# Patient Record
Sex: Female | Born: 1985 | Race: White | Hispanic: No | Marital: Married | Smoking: Never smoker
Health system: Southern US, Community
[De-identification: ages and names within clinical notes are randomized; demographics above are authoritative.]

## PROBLEM LIST (undated history)

## (undated) DIAGNOSIS — R569 Unspecified convulsions: Secondary | ICD-10-CM

## (undated) DIAGNOSIS — G43909 Migraine, unspecified, not intractable, without status migrainosus: Secondary | ICD-10-CM

## (undated) DIAGNOSIS — F329 Major depressive disorder, single episode, unspecified: Secondary | ICD-10-CM

## (undated) DIAGNOSIS — Z87442 Personal history of urinary calculi: Secondary | ICD-10-CM

## (undated) DIAGNOSIS — K219 Gastro-esophageal reflux disease without esophagitis: Secondary | ICD-10-CM

## (undated) DIAGNOSIS — K5792 Diverticulitis of intestine, part unspecified, without perforation or abscess without bleeding: Secondary | ICD-10-CM

## (undated) DIAGNOSIS — J45909 Unspecified asthma, uncomplicated: Secondary | ICD-10-CM

## (undated) DIAGNOSIS — M549 Dorsalgia, unspecified: Secondary | ICD-10-CM

## (undated) DIAGNOSIS — T8859XA Other complications of anesthesia, initial encounter: Secondary | ICD-10-CM

## (undated) DIAGNOSIS — T4145XA Adverse effect of unspecified anesthetic, initial encounter: Secondary | ICD-10-CM

## (undated) DIAGNOSIS — F419 Anxiety disorder, unspecified: Secondary | ICD-10-CM

## (undated) DIAGNOSIS — R112 Nausea with vomiting, unspecified: Secondary | ICD-10-CM

## (undated) DIAGNOSIS — K5190 Ulcerative colitis, unspecified, without complications: Secondary | ICD-10-CM

## (undated) DIAGNOSIS — J302 Other seasonal allergic rhinitis: Secondary | ICD-10-CM

## (undated) DIAGNOSIS — G709 Myoneural disorder, unspecified: Secondary | ICD-10-CM

## (undated) DIAGNOSIS — K529 Noninfective gastroenteritis and colitis, unspecified: Secondary | ICD-10-CM

## (undated) DIAGNOSIS — M1990 Unspecified osteoarthritis, unspecified site: Secondary | ICD-10-CM

## (undated) DIAGNOSIS — D649 Anemia, unspecified: Secondary | ICD-10-CM

## (undated) DIAGNOSIS — Z9889 Other specified postprocedural states: Secondary | ICD-10-CM

## (undated) DIAGNOSIS — T148XXA Other injury of unspecified body region, initial encounter: Secondary | ICD-10-CM

## (undated) DIAGNOSIS — F32A Depression, unspecified: Secondary | ICD-10-CM

## (undated) DIAGNOSIS — N809 Endometriosis, unspecified: Secondary | ICD-10-CM

## (undated) HISTORY — PX: WISDOM TOOTH EXTRACTION: SHX21

## (undated) HISTORY — PX: UPPER GI ENDOSCOPY: SHX6162

## (undated) HISTORY — PX: OTHER SURGICAL HISTORY: SHX169

## (undated) HISTORY — PX: KNEE SURGERY: SHX244

## (undated) HISTORY — PX: BOWEL RESECTION: SHX1257

## (undated) HISTORY — PX: CHOLECYSTECTOMY: SHX55

## (undated) HISTORY — PX: COLONOSCOPY: SHX174

---
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
## 2007-11-02 ENCOUNTER — Ambulatory Visit (HOSPITAL_BASED_OUTPATIENT_CLINIC_OR_DEPARTMENT_OTHER): Admission: RE | Admit: 2007-11-02 | Discharge: 2007-11-02 | Payer: Self-pay | Admitting: Orthopedic Surgery

## 2008-09-26 ENCOUNTER — Other Ambulatory Visit: Admission: RE | Admit: 2008-09-26 | Discharge: 2008-09-26 | Payer: Self-pay | Admitting: Obstetrics & Gynecology

## 2010-01-16 ENCOUNTER — Other Ambulatory Visit: Admission: RE | Admit: 2010-01-16 | Discharge: 2010-01-16 | Payer: Self-pay | Admitting: Obstetrics & Gynecology

## 2010-02-03 ENCOUNTER — Encounter
Admission: RE | Admit: 2010-02-03 | Discharge: 2010-05-04 | Payer: Self-pay | Source: Home / Self Care | Admitting: Orthopedic Surgery

## 2010-05-29 ENCOUNTER — Encounter
Admission: RE | Admit: 2010-05-29 | Discharge: 2010-06-10 | Payer: Self-pay | Source: Home / Self Care | Admitting: Orthopedic Surgery

## 2010-06-20 ENCOUNTER — Emergency Department (HOSPITAL_COMMUNITY)
Admission: EM | Admit: 2010-06-20 | Discharge: 2010-06-21 | Payer: Self-pay | Source: Home / Self Care | Admitting: Emergency Medicine

## 2010-09-23 LAB — COMPREHENSIVE METABOLIC PANEL
ALT: 16 U/L (ref 0–35)
AST: 19 U/L (ref 0–37)
Albumin: 3.6 g/dL (ref 3.5–5.2)
Alkaline Phosphatase: 49 U/L (ref 39–117)
BUN: 11 mg/dL (ref 6–23)
CO2: 23 mEq/L (ref 19–32)
Calcium: 9.1 mg/dL (ref 8.4–10.5)
Chloride: 109 mEq/L (ref 96–112)
Creatinine, Ser: 1.04 mg/dL (ref 0.4–1.2)
GFR calc Af Amer: >60 mL/min (ref >60)
GFR calc non Af Amer: >60 mL/min (ref >60)
Glucose, Bld: 84 mg/dL (ref 70–99)
Potassium: 3.7 mEq/L (ref 3.5–5.1)
Sodium: 141 mEq/L (ref 135–145)
Total Bilirubin: 0.6 mg/dL (ref 0.3–1.2)
Total Protein: 7.2 g/dL (ref 6.0–8.3)

## 2010-09-23 LAB — URINALYSIS, ROUTINE W REFLEX MICROSCOPIC
Bilirubin Urine: NEGATIVE
Glucose, UA: NEGATIVE mg/dL
Ketones, ur: NEGATIVE mg/dL
Leukocytes, UA: NEGATIVE
Nitrite: NEGATIVE
Protein, ur: NEGATIVE mg/dL
Specific Gravity, Urine: 1.029 (ref 1.005–1.030)
Urobilinogen, UA: 0.2 mg/dL (ref 0.0–1.0)
pH: 6 (ref 5.0–8.0)

## 2010-09-23 LAB — DIFFERENTIAL
Basophils Absolute: 0.1 10*3/uL (ref 0.0–0.1)
Basophils Relative: 1 % (ref 0–1)
Eosinophils Absolute: 0.1 10*3/uL (ref 0.0–0.7)
Eosinophils Relative: 2 % (ref 0–5)
Lymphocytes Relative: 32 % (ref 12–46)
Lymphs Abs: 3 10*3/uL (ref 0.7–4.0)
Monocytes Absolute: 0.7 10*3/uL (ref 0.1–1.0)
Monocytes Relative: 8 % (ref 3–12)
Neutro Abs: 5.6 10*3/uL (ref 1.7–7.7)
Neutrophils Relative %: 59 % (ref 43–77)

## 2010-09-23 LAB — GC/CHLAMYDIA PROBE AMP, GENITAL
Chlamydia, DNA Probe: NEGATIVE
GC Probe Amp, Genital: NEGATIVE

## 2010-09-23 LAB — POCT PREGNANCY, URINE: Preg Test, Ur: NEGATIVE

## 2010-09-23 LAB — CBC
HCT: 39.8 % (ref 36.0–46.0)
Hemoglobin: 13.8 g/dL (ref 12.0–15.0)
MCH: 30.2 pg (ref 26.0–34.0)
MCHC: 34.7 g/dL (ref 30.0–36.0)
MCV: 87.1 fL (ref 78.0–100.0)
Platelets: 280 10*3/uL (ref 150–400)
RBC: 4.57 MIL/uL (ref 3.87–5.11)
RDW: 12.4 % (ref 11.5–15.5)
WBC: 9.5 10*3/uL (ref 4.0–10.5)

## 2010-09-23 LAB — URINE MICROSCOPIC-ADD ON

## 2010-09-23 LAB — RPR: RPR Ser Ql: NONREACTIVE

## 2010-09-23 LAB — WET PREP, GENITAL
Clue Cells Wet Prep HPF POC: NONE SEEN
Trich, Wet Prep: NONE SEEN
WBC, Wet Prep HPF POC: NONE SEEN
Yeast Wet Prep HPF POC: NONE SEEN

## 2010-11-25 NOTE — Op Note (Signed)
NAME:  [name] [name], [name] [name]               ACCOUNT NO.:  [id number]   MEDICAL RECORD NO.:  [id number]          PATIENT TYPE:  AMB   LOCATION:  DSC                          FACILITY:  [facility]   PHYSICIAN:  Harvie Junior, M.D.   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   POSTOPERATIVE DIAGNOSES:  1. 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  2. Medial shelf plica.   PROCEDURES:  1. Chondroplasty of the posterior patellar facet, particularly on the      lateral side.  2. Lateral retinacular release.  3. Excision of medial shelf plica.   SURGEON:  Harvie Junior, MD   ASSISTANT:  __________  .   ANESTHESIA:  General.   BRIEF HISTORY:  Ms. [name] [name] is a 25 year old female with a long history  of having had significant patellofemoral pain and possibly having had a  medial patellar dislocation that was unclear.  MRI showed that she had  some thickening on the medial side, questionable issues with the medial  patellar retinaculum, had some tightness on the lateral side, had some  chondromalacia of the patella.  We ultimately treated conservatively for  a period of time with physical therapy.  She really was not bending her  knee passed about 45 degrees.  This has been going on for about 3 months  and we felt that we are going to make sure there is not something that  was blocking her coming in to flexion as well as sorting out what the  issues were in the knee and she was brought to the operating room for  this procedure.  We planned preoperatively to do a lateral retinacular  release, which is based on her huge Q-angel and the patellar femoral  issues raised on MRI.   PROCEDURE:  The patient was brought to the  operating room and after  adequate anesthesia was obtained with general anesthetic, the patient  was placed supine on the operating table.  The left leg was prepped and  draped in usual sterile fashion.  Following this, routine arthroscopic  examination of the knee revealed that there was an obvious large medial  shelf plica.  This was debrided back to the rim of the knee joint  capsule.  Attention was turned medially where the medial meniscus and  medial femoral condyle were probed at length and felt to be stable.  The  attention was turned to the ACL, which was normal.  Attention was turned  to the lateral side where the lateral meniscus and lateral femoral  condyle were within normal limits.  Attention then turned away from the  lateral side up into the patellofemoral joint, where the patellofemoral  joint showed fairly dramatic lateral patellar tracking.  There was a  little bit of tightness in the patellar femoral joint as well.  At this  point, the lateral retinaculum  was identified and the lateral  retinacular release was performed from the 3 fingerbreadths proximal to  the patella all the way down to the joint line which was completed.  The  patellar tracking was identified and noted to be midline.  This was  dramatic correction of the patellar tracking once this was complete.  At  this point, the attention was turned towards the knee joint where a  chondromalacia on the lateral patellar facet was debrided back to a  smooth and stable rim.  At this point, the knee was copiously and  thoroughly irrigated, suctioned dry.  Final check was made for loosening  fragment and pieces, seeing none.  The patient was taken to the recovery  room and she was noted to be in satisfactory condition.  Estimated blood  loss for procedure was none.      Harvie Junior, M.D.  Electronically Signed     JLG/MEDQ  D:  11/02/2007  T:  11/03/2007  Job:  [id number]

## 2011-02-24 ENCOUNTER — Other Ambulatory Visit: Payer: Self-pay | Admitting: Obstetrics & Gynecology

## 2011-02-24 ENCOUNTER — Other Ambulatory Visit (HOSPITAL_COMMUNITY)
Admission: RE | Admit: 2011-02-24 | Discharge: 2011-02-24 | Disposition: A | Discharge status: Home / Self Care | Payer: 59 | Source: Ambulatory Visit | Attending: Obstetrics & Gynecology | Admitting: Obstetrics & Gynecology

## 2011-02-24 DIAGNOSIS — Z01419 Encounter for gynecological examination (general) (routine) without abnormal findings: Secondary | ICD-10-CM | POA: Insufficient documentation

## 2011-04-07 LAB — POCT HEMOGLOBIN-HEMACUE: Hemoglobin: 14.2

## 2011-12-14 ENCOUNTER — Other Ambulatory Visit: Payer: Self-pay | Admitting: Family Medicine

## 2011-12-14 DIAGNOSIS — R319 Hematuria, unspecified: Secondary | ICD-10-CM

## 2011-12-15 ENCOUNTER — Ambulatory Visit
Admission: RE | Admit: 2011-12-15 | Discharge: 2011-12-15 | Disposition: A | Discharge status: Home / Self Care | Payer: 59 | Source: Ambulatory Visit | Attending: Family Medicine | Admitting: Family Medicine

## 2011-12-15 ENCOUNTER — Other Ambulatory Visit: Payer: 59

## 2011-12-15 DIAGNOSIS — R319 Hematuria, unspecified: Secondary | ICD-10-CM

## 2011-12-20 ENCOUNTER — Emergency Department (HOSPITAL_COMMUNITY): Payer: 59

## 2011-12-20 ENCOUNTER — Encounter (HOSPITAL_COMMUNITY): Payer: Self-pay | Admitting: *Deleted

## 2011-12-20 ENCOUNTER — Emergency Department (HOSPITAL_COMMUNITY)
Admission: EM | Admit: 2011-12-20 | Discharge: 2011-12-20 | Disposition: A | Discharge status: Home / Self Care | Payer: 59 | Attending: Emergency Medicine | Admitting: Emergency Medicine

## 2011-12-20 DIAGNOSIS — R112 Nausea with vomiting, unspecified: Secondary | ICD-10-CM | POA: Insufficient documentation

## 2011-12-20 DIAGNOSIS — M549 Dorsalgia, unspecified: Secondary | ICD-10-CM | POA: Insufficient documentation

## 2011-12-20 DIAGNOSIS — R109 Unspecified abdominal pain: Secondary | ICD-10-CM

## 2011-12-20 DIAGNOSIS — R1031 Right lower quadrant pain: Secondary | ICD-10-CM | POA: Insufficient documentation

## 2011-12-20 HISTORY — DX: Other injury of unspecified body region, initial encounter: T14.8XXA

## 2011-12-20 HISTORY — DX: Unspecified asthma, uncomplicated: J45.909

## 2011-12-20 LAB — BASIC METABOLIC PANEL
BUN: 6 mg/dL (ref 6–23)
CO2: 22 mEq/L (ref 19–32)
Calcium: 9 mg/dL (ref 8.4–10.5)
Chloride: 103 mEq/L (ref 96–112)
Creatinine, Ser: 0.74 mg/dL (ref 0.50–1.10)
GFR calc Af Amer: >90 mL/min (ref >90)
GFR calc non Af Amer: >90 mL/min (ref >90)
Glucose, Bld: 84 mg/dL (ref 70–99)
Potassium: 3.4 mEq/L — ABNORMAL LOW (ref 3.5–5.1)
Sodium: 137 mEq/L (ref 135–145)

## 2011-12-20 LAB — CBC
HCT: 36.2 % (ref 36.0–46.0)
Hemoglobin: 12.5 g/dL (ref 12.0–15.0)
MCH: 29.4 pg (ref 26.0–34.0)
MCHC: 34.5 g/dL (ref 30.0–36.0)
MCV: 85.2 fL (ref 78.0–100.0)
Platelets: 311 10*3/uL (ref 150–400)
RBC: 4.25 MIL/uL (ref 3.87–5.11)
RDW: 12.1 % (ref 11.5–15.5)
WBC: 12.2 10*3/uL — ABNORMAL HIGH (ref 4.0–10.5)

## 2011-12-20 LAB — URINE MICROSCOPIC-ADD ON

## 2011-12-20 LAB — DIFFERENTIAL
Basophils Absolute: 0 10*3/uL (ref 0.0–0.1)
Basophils Relative: 0 % (ref 0–1)
Eosinophils Absolute: 0.1 10*3/uL (ref 0.0–0.7)
Eosinophils Relative: 1 % (ref 0–5)
Lymphocytes Relative: 15 % (ref 12–46)
Lymphs Abs: 1.8 10*3/uL (ref 0.7–4.0)
Monocytes Absolute: 0.9 10*3/uL (ref 0.1–1.0)
Monocytes Relative: 7 % (ref 3–12)
Neutro Abs: 9.4 10*3/uL — ABNORMAL HIGH (ref 1.7–7.7)
Neutrophils Relative %: 77 % (ref 43–77)

## 2011-12-20 LAB — URINALYSIS, ROUTINE W REFLEX MICROSCOPIC
Glucose, UA: NEGATIVE mg/dL
Ketones, ur: NEGATIVE mg/dL
Nitrite: NEGATIVE
Protein, ur: NEGATIVE mg/dL
Specific Gravity, Urine: 1.021 (ref 1.005–1.030)
Urobilinogen, UA: 1 mg/dL (ref 0.0–1.0)
pH: 5.5 (ref 5.0–8.0)

## 2011-12-20 LAB — PREGNANCY, URINE: Preg Test, Ur: NEGATIVE

## 2011-12-20 MED ORDER — MORPHINE SULFATE 4 MG/ML IJ SOLN
4.0000 mg | Freq: Once | INTRAMUSCULAR | Status: AC
  Administered 2011-12-20: 4 mg via INTRAVENOUS
  Filled 2011-12-20: qty 1

## 2011-12-20 MED ORDER — SODIUM CHLORIDE 0.9 % IV BOLUS (SEPSIS)
1000.0000 mL | Freq: Once | INTRAVENOUS | Status: AC
  Administered 2011-12-20: 1000 mL via INTRAVENOUS

## 2011-12-20 MED ORDER — ONDANSETRON HCL 4 MG/2ML IJ SOLN
4.0000 mg | Freq: Once | INTRAMUSCULAR | Status: AC
  Administered 2011-12-20: 4 mg via INTRAVENOUS
  Filled 2011-12-20: qty 2

## 2011-12-20 MED ORDER — SODIUM CHLORIDE 0.9 % IV SOLN
Freq: Once | INTRAVENOUS | Status: AC
  Administered 2011-12-20: 11:00:00 via INTRAVENOUS

## 2011-12-20 MED ORDER — OXYCODONE-ACETAMINOPHEN 5-325 MG PO TABS: 1.0000 | ORAL_TABLET | ORAL | Status: AC | PRN

## 2011-12-20 MED ORDER — MORPHINE SULFATE 4 MG/ML IJ SOLN
4.0000 mg | Freq: Once | INTRAMUSCULAR | Status: AC
Start: 2011-12-20 — End: 2011-12-20
  Administered 2011-12-20: 4 mg via INTRAVENOUS
  Filled 2011-12-20: qty 1

## 2011-12-20 NOTE — ED Provider Notes (Addendum)
History     CSN: [id number]  Arrival date & time 12/20/11  0920   First MD Initiated Contact with Patient 12/20/11 1008      Chief Complaint  Patient presents with  . Back Pain    lower  . Abdominal Pain    lower  . Nausea  . Emesis    (Consider location/radiation/quality/duration/timing/severity/associated sxs/prior treatment) HPI Comments: Patient presents with low back pain bilaterally that does radiate around to her lower abdomen bilaterally.  She has associated nausea and vomiting.  She's noted some looser stools but no frankly bloody or melenic stools.  No dysuria or hematuria.  Patient's only abdominal surgery was cholecystectomy.  Patient notes she saw her primary care physician this week to obtain a urinalysis and initially treated her for UTI but the culture came back negative so she was told to stop her antibiotics.  They also obtained a CT scan of her abdomen and pelvis to look for possible kidney stone and that scan was negative.  Patient's pain worsened again last night and this morning which is why she presents here to the emergency dept.  her symptoms worsen with moving around more.  She been taking Percocet for pain with some relief until last night.  She denies vaginal discharge.  Symptoms do not change with eating.   Patient is a 26 y.o. female presenting with back pain, abdominal pain, and vomiting. The history is provided by the patient. No language interpreter was used.  Back Pain  This is a new problem. Associated symptoms include abdominal pain. Pertinent negatives include no chest pain, no fever, no headaches and no dysuria.  Abdominal Pain The primary symptoms of the illness include abdominal pain, nausea and vomiting. The primary symptoms of the illness do not include fever, shortness of breath, diarrhea, dysuria, vaginal discharge or vaginal bleeding.  Additional symptoms associated with the illness include back pain. Symptoms associated with the illness do not  include chills, urgency, hematuria or frequency.  Emesis  Associated symptoms include abdominal pain. Pertinent negatives include no chills, no cough, no diarrhea, no fever and no headaches.    Past Medical History  Diagnosis Date  . Nerve damage     left femoral nerve  . Asthma     Past Surgical History  Procedure Date  . Knee surgery     left knee  . Cholecystectomy   . 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 Wisdom tooth extraction     History reviewed. No pertinent family history.  History  Substance Use Topics  . Smoking status: Never Smoker   . Smokeless tobacco: Never Used  . Alcohol Use: Yes     socially    OB History    Grav Para Term Preterm Abortions TAB SAB Ect Mult Living                  Review of Systems  Constitutional: Negative.  Negative for fever and chills.  HENT: Negative.   Eyes: Negative.  Negative for discharge and redness.  Respiratory: Negative.  Negative for cough and shortness of breath.   Cardiovascular: Negative.  Negative for chest pain.  Gastrointestinal: Positive for nausea, vomiting and abdominal pain. Negative for diarrhea.  Genitourinary: Negative.  Negative for dysuria, urgency, frequency, hematuria, flank pain, vaginal bleeding and vaginal discharge.  Musculoskeletal: Positive for back pain.  Skin: Negative.  Negative for color change and rash.  Neurological: Negative.  Negative for syncope and headaches.  Hematological: Negative.  Negative for adenopathy.  Psychiatric/Behavioral: Negative.  Negative for confusion.  All other systems reviewed and are negative.    Allergies  Tylenol with codeine #3  Home Medications   Current Outpatient Rx  Name Route Sig Dispense Refill  . IBUPROFEN 200 MG PO TABS Oral Take 600 mg by mouth every 6 (six) hours as needed. For pain    . LEVOCETIRIZINE DIHYDROCHLORIDE 5 MG PO TABS Oral Take 5 mg by mouth every evening.    . ADULT MULTIVITAMIN W/MINERALS CH Oral Take 1 tablet  by mouth daily.    Kathrynn Running ESTRAD-FE BIPHAS 1 MG-10 MCG / 10 MCG PO TABS Oral Take 1 tablet by mouth daily.    Marland Kitchen ONDANSETRON HCL 8 MG PO TABS Oral Take 8 mg by mouth every 8 (eight) hours as needed. For nausea    . OXYCODONE-ACETAMINOPHEN 5-325 MG PO TABS Oral Take 1 tablet by mouth every 6 (six) hours as needed.      BP 143/74  Pulse 96  Temp(Src) 98.5 F (36.9 C) (Oral)  Resp 18  Wt 200 lb (90.719 kg)  SpO2 100%  LMP 12/01/2011  Physical Exam  Nursing note and vitals reviewed. Constitutional: She is oriented to person, place, and time. She appears well-developed and well-nourished.  Non-toxic appearance. She does not have a sickly appearance.  HENT:  Head: Normocephalic and atraumatic.  Eyes: Conjunctivae, EOM and lids are normal. Pupils are equal, round, and reactive to light. No scleral icterus.  Neck: Trachea normal and normal range of motion. Neck supple.  Cardiovascular: Normal rate, regular rhythm and normal heart sounds.   Pulmonary/Chest: Effort normal and breath sounds normal. No respiratory distress. She has no wheezes. She has no rales.  Abdominal: Soft. Normal appearance. There is tenderness. There is no rebound, no guarding and no CVA tenderness.       Mild tenderness to palpation in her suprapubic region and left lower quadrant and right lower quadrant.  Musculoskeletal: Normal range of motion.  Neurological: She is alert and oriented to person, place, and time. She has normal strength.  Skin: Skin is warm, dry and intact. No rash noted.  Psychiatric: She has a normal mood and affect. Her behavior is normal. Judgment and thought content normal.    ED Course  Procedures (including critical care time)  Results for orders placed during the hospital encounter of 12/20/11  URINALYSIS, ROUTINE W REFLEX MICROSCOPIC      Component Value Range   Color, Urine YELLOW  YELLOW    APPearance CLOUDY (*) CLEAR    Specific Gravity, Urine 1.021  1.005 - 1.030    pH 5.5   5.0 - 8.0    Glucose, UA NEGATIVE  NEGATIVE (mg/dL)   Hgb urine dipstick SMALL (*) NEGATIVE    Bilirubin Urine SMALL (*) NEGATIVE    Ketones, ur NEGATIVE  NEGATIVE (mg/dL)   Protein, ur NEGATIVE  NEGATIVE (mg/dL)   Urobilinogen, UA 1.0  0.0 - 1.0 (mg/dL)   Nitrite NEGATIVE  NEGATIVE    Leukocytes, UA SMALL (*) NEGATIVE   PREGNANCY, URINE      Component Value Range   Preg Test, Ur NEGATIVE  NEGATIVE   CBC      Component Value Range   WBC 12.2 (*) 4.0 - 10.5 (K/uL)   RBC 4.25  3.87 - 5.11 (MIL/uL)   Hemoglobin 12.5  12.0 - 15.0 (g/dL)   HCT 40.9  81.1 - 91.4 (%)   MCV 85.2  78.0 -  100.0 (fL)   MCH 29.4  26.0 - 34.0 (pg)   MCHC 34.5  30.0 - 36.0 (g/dL)   RDW 16.1  09.6 - 04.5 (%)   Platelets 311  150 - 400 (K/uL)  DIFFERENTIAL      Component Value Range   Neutrophils Relative 77  43 - 77 (%)   Neutro Abs 9.4 (*) 1.7 - 7.7 (K/uL)   Lymphocytes Relative 15  12 - 46 (%)   Lymphs Abs 1.8  0.7 - 4.0 (K/uL)   Monocytes Relative 7  3 - 12 (%)   Monocytes Absolute 0.9  0.1 - 1.0 (K/uL)   Eosinophils Relative 1  0 - 5 (%)   Eosinophils Absolute 0.1  0.0 - 0.7 (K/uL)   Basophils Relative 0  0 - 1 (%)   Basophils Absolute 0.0  0.0 - 0.1 (K/uL)  BASIC METABOLIC PANEL      Component Value Range   Sodium 137  135 - 145 (mEq/L)   Potassium 3.4 (*) 3.5 - 5.1 (mEq/L)   Chloride 103  96 - 112 (mEq/L)   CO2 22  19 - 32 (mEq/L)   Glucose, Bld 84  70 - 99 (mg/dL)   BUN 6  6 - 23 (mg/dL)   Creatinine, Ser 4.09  0.50 - 1.10 (mg/dL)   Calcium 9.0  8.4 - 81.1 (mg/dL)   GFR calc non Af Amer >90  >90 (mL/min)   GFR calc Af Amer >90  >90 (mL/min)  URINE MICROSCOPIC-ADD ON      Component Value Range   Squamous Epithelial / LPF FEW (*) RARE    WBC, UA 3-6  <3 (WBC/hpf)   RBC / HPF 0-2  <3 (RBC/hpf)   Bacteria, UA FEW (*) RARE    Urine-Other MUCOUS PRESENT     Ct Abdomen Pelvis Wo Contrast  12/15/2011  *RADIOLOGY REPORT*  Clinical Data: Right lower quadrant and flank pain for 10 days,  microscopic hematuria  CT ABDOMEN AND PELVIS WITHOUT CONTRAST  Technique:  Multidetector CT imaging of the abdomen and pelvis was performed following the standard protocol without intravenous contrast.  Comparison: CT abdomen pelvis of 06/21/2010  Findings: The lung bases are clear.  The liver is unremarkable in the unenhanced state.  Surgical clips are present from prior cholecystectomy.  The pancreas is not optimally seen on this unenhanced study.  No significant abnormality is noted.  The pancreatic duct does not appear to be dilated.  The adrenal glands and spleen are unremarkable.  The stomach is not well distended. No renal calculi are seen.  There is no evidence of hydronephrosis. On this unenhanced study no renal mass is noted.  The abdominal aorta is normal in caliber.  No adenopathy is seen.   The urinary bladder is decompressed.  The uterus is normal in size.  No adnexal lesion is seen.  No fluid is noted within the pelvis.  No abnormality of the colon is seen.  The terminal ileum is unremarkable.  The appendix is unremarkable.  No bony abnormality is seen.  IMPRESSION:  1.  No renal calculi.  No hydronephrosis. 2.  The appendix and terminal ileum are unremarkable.  Original Report Authenticated By: Juline Patch, M.D.   US Transvaginal Non-ob  12/20/2011  *RADIOLOGY REPORT*  Clinical Data:  Lower abdominal pain intermittently for 1 week.  TRANSABDOMINAL AND TRANSVAGINAL ULTRASOUND OF PELVIS DOPPLER ULTRASOUND OF OVARIES  Technique:  Both transabdominal and transvaginal ultrasound examinations of the pelvis were performed. Transabdominal technique  was performed for global imaging of the pelvis including uterus, ovaries, adnexal regions, and pelvic cul-de-sac.  It was necessary to proceed with endovaginal exam following the transabdominal exam to visualize the the ovaries and adnexa.  Color and duplex Doppler ultrasound was utilized to evaluate blood flow to the ovaries.  Comparison:  CT abdomen pelvis  12/15/2011  Findings:  Uterus:  Retroflexed and measures 7.0 x 2.1 x 2.7 cm.  No focal uterine mass identified.  Endometrium:  There is a small amount of fluid within the endometrial canal.  Double wall thickness of the endometrium is approximately 3 mm, within normal limits.  Right ovary: Normal appearance/no adnexal mass.  Measures 2.5 x 1.7 x 2.0 cm.  Left ovary:   Normal appearance/no adnexal mass. Measures 2.0 x 1.2 x 1.6 cm.  Pulsed Doppler evaluation demonstrates normal low-resistance arterial and venous waveforms in both ovaries.  There is a small to moderate amount of simple free fluid in the pelvis.  IMPRESSION: 1.  No evidence of pelvic mass or other significant abnormality.  2.  No sonographic evidence for ovarian torsion.  3.  Small to moderate amount of simple free fluid in the pelvis.  Original Report Authenticated By: Britta Mccreedy, M.D.   US Pelvis Complete  12/20/2011  *RADIOLOGY REPORT*  Clinical Data:  Lower abdominal pain intermittently for 1 week.  TRANSABDOMINAL AND TRANSVAGINAL ULTRASOUND OF PELVIS DOPPLER ULTRASOUND OF OVARIES  Technique:  Both transabdominal and transvaginal ultrasound examinations of the pelvis were performed. Transabdominal technique was performed for global imaging of the pelvis including uterus, ovaries, adnexal regions, and pelvic cul-de-sac.  It was necessary to proceed with endovaginal exam following the transabdominal exam to visualize the the ovaries and adnexa.  Color and duplex Doppler ultrasound was utilized to evaluate blood flow to the ovaries.  Comparison:  CT abdomen pelvis 12/15/2011  Findings:  Uterus:  Retroflexed and measures 7.0 x 2.1 x 2.7 cm.  No focal uterine mass identified.  Endometrium:  There is a small amount of fluid within the endometrial canal.  Double wall thickness of the endometrium is approximately 3 mm, within normal limits.  Right ovary: Normal appearance/no adnexal mass.  Measures 2.5 x 1.7 x 2.0 cm.  Left ovary:   Normal  appearance/no adnexal mass. Measures 2.0 x 1.2 x 1.6 cm.  Pulsed Doppler evaluation demonstrates normal low-resistance arterial and venous waveforms in both ovaries.  There is a small to moderate amount of simple free fluid in the pelvis.  IMPRESSION: 1.  No evidence of pelvic mass or other significant abnormality.  2.  No sonographic evidence for ovarian torsion.  3.  Small to moderate amount of simple free fluid in the pelvis.  Original Report Authenticated By: Britta Mccreedy, M.D.   [place] Art/ven Flow Abd Pelv Doppler  12/20/2011  *RADIOLOGY REPORT*  Clinical Data:  Lower abdominal pain intermittently for 1 week.  TRANSABDOMINAL AND TRANSVAGINAL ULTRASOUND OF PELVIS DOPPLER ULTRASOUND OF OVARIES  Technique:  Both transabdominal and transvaginal ultrasound examinations of the pelvis were performed. Transabdominal technique was performed for global imaging of the pelvis including uterus, ovaries, adnexal regions, and pelvic cul-de-sac.  It was necessary to proceed with endovaginal exam following the transabdominal exam to visualize the the ovaries and adnexa.  Color and duplex Doppler ultrasound was utilized to evaluate blood flow to the ovaries.  Comparison:  CT abdomen pelvis 12/15/2011  Findings:  Uterus:  Retroflexed and measures 7.0 x 2.1 x 2.7 cm.  No focal uterine mass identified.  Endometrium:  There is a small amount of fluid within the endometrial canal.  Double wall thickness of the endometrium is approximately 3 mm, within normal limits.  Right ovary: Normal appearance/no adnexal mass.  Measures 2.5 x 1.7 x 2.0 cm.  Left ovary:   Normal appearance/no adnexal mass. Measures 2.0 x 1.2 x 1.6 cm.  Pulsed Doppler evaluation demonstrates normal low-resistance arterial and venous waveforms in both ovaries.  There is a small to moderate amount of simple free fluid in the pelvis.  IMPRESSION: 1.  No evidence of pelvic mass or other significant abnormality.  2.  No sonographic evidence for ovarian torsion.  3.   Small to moderate amount of simple free fluid in the pelvis.  Original Report Authenticated By: Britta Mccreedy, M.D.     MDM  Patient with lower abdominal pain bilaterally for the last week.  She states she had a urinalysis checked which initially seemed consistent with UTI but had a negative culture.  Still recheck for urinary tract infection here.  Patient is on birth control will check a pregnancy test.  Given patient's already negative CT scan completed 5 days ago my other considerations do include ovarian torsion or ovarian cyst.  Patient will have a pelvic ultrasound to further evaluate for this possibility.    Nat Christen, MD 12/20/11 1037  Patient without clear etiology for her lower back and lower, pain.  She has no signs of urinary tract infection.  She had a CT scan earlier this week which was negative.  I performed a pelvic ultrasound today to assess for any ovarian cyst or ovarian torsion and this was normal.  She has laboratory studies here that are normal.  Her pain had initially been improved but is now returning.  As patient has normal vital signs and no acute pathology that can be determined I feel the patient is safe for discharge home.  She is comfortable with this plan.  I've instructed her to followup with a GI physician and with the gynecologist, both of which she has.  Nat Christen, MD 12/20/11 1331

## 2011-12-20 NOTE — ED Notes (Signed)
Pt from home with reports of pain that started in lower back on Wednesday (1 1/2 weeks ago) and radiates to lower abdomen. Pt endorses being seen by PCP 3 times with negative CT scan performed and was placed on antibiotics for 3 days for UTI but was told to stop taking after 3 days due to negative urine culture. Pt also reports N/V and loose stools for over a week as well.

## 2011-12-20 NOTE — Discharge Instructions (Signed)

## 2012-01-01 ENCOUNTER — Encounter (HOSPITAL_COMMUNITY): Payer: Self-pay | Admitting: *Deleted

## 2012-01-01 ENCOUNTER — Telehealth (INDEPENDENT_AMBULATORY_CARE_PROVIDER_SITE_OTHER): Payer: Self-pay | Admitting: General Surgery

## 2012-01-01 ENCOUNTER — Emergency Department (HOSPITAL_COMMUNITY): Payer: 59

## 2012-01-01 ENCOUNTER — Emergency Department (HOSPITAL_COMMUNITY)
Admission: EM | Admit: 2012-01-01 | Discharge: 2012-01-01 | Disposition: A | Discharge status: Home / Self Care | Payer: 59 | Attending: Emergency Medicine | Admitting: Emergency Medicine

## 2012-01-01 DIAGNOSIS — R1032 Left lower quadrant pain: Secondary | ICD-10-CM | POA: Insufficient documentation

## 2012-01-01 DIAGNOSIS — R109 Unspecified abdominal pain: Secondary | ICD-10-CM | POA: Insufficient documentation

## 2012-01-01 DIAGNOSIS — R1030 Lower abdominal pain, unspecified: Secondary | ICD-10-CM

## 2012-01-01 DIAGNOSIS — R1031 Right lower quadrant pain: Secondary | ICD-10-CM | POA: Insufficient documentation

## 2012-01-01 DIAGNOSIS — J45909 Unspecified asthma, uncomplicated: Secondary | ICD-10-CM | POA: Insufficient documentation

## 2012-01-01 DIAGNOSIS — Z79899 Other long term (current) drug therapy: Secondary | ICD-10-CM | POA: Insufficient documentation

## 2012-01-01 LAB — WET PREP, GENITAL
Clue Cells Wet Prep HPF POC: NONE SEEN
Trich, Wet Prep: NONE SEEN
Yeast Wet Prep HPF POC: NONE SEEN

## 2012-01-01 LAB — URINE MICROSCOPIC-ADD ON

## 2012-01-01 LAB — URINALYSIS, ROUTINE W REFLEX MICROSCOPIC
Bilirubin Urine: NEGATIVE
Glucose, UA: NEGATIVE mg/dL
Ketones, ur: NEGATIVE mg/dL
Leukocytes, UA: NEGATIVE
Nitrite: NEGATIVE
Protein, ur: NEGATIVE mg/dL
Specific Gravity, Urine: 1.02 (ref 1.005–1.030)
Urobilinogen, UA: 0.2 mg/dL (ref 0.0–1.0)
pH: 5.5 (ref 5.0–8.0)

## 2012-01-01 LAB — COMPREHENSIVE METABOLIC PANEL
ALT: 15 U/L (ref 0–35)
AST: 15 U/L (ref 0–37)
Albumin: 3.6 g/dL (ref 3.5–5.2)
Alkaline Phosphatase: 54 U/L (ref 39–117)
BUN: 10 mg/dL (ref 6–23)
CO2: 25 mEq/L (ref 19–32)
Calcium: 9.3 mg/dL (ref 8.4–10.5)
Chloride: 104 mEq/L (ref 96–112)
Creatinine, Ser: 0.72 mg/dL (ref 0.50–1.10)
GFR calc Af Amer: >90 mL/min (ref >90)
GFR calc non Af Amer: >90 mL/min (ref >90)
Glucose, Bld: 86 mg/dL (ref 70–99)
Potassium: 4.1 mEq/L (ref 3.5–5.1)
Sodium: 137 mEq/L (ref 135–145)
Total Bilirubin: 0.2 mg/dL — ABNORMAL LOW (ref 0.3–1.2)
Total Protein: 7.1 g/dL (ref 6.0–8.3)

## 2012-01-01 LAB — DIFFERENTIAL
Basophils Absolute: 0.1 10*3/uL (ref 0.0–0.1)
Basophils Relative: 1 % (ref 0–1)
Eosinophils Absolute: 0.1 10*3/uL (ref 0.0–0.7)
Eosinophils Relative: 1 % (ref 0–5)
Lymphocytes Relative: 25 % (ref 12–46)
Lymphs Abs: 2.1 10*3/uL (ref 0.7–4.0)
Monocytes Absolute: 0.5 10*3/uL (ref 0.1–1.0)
Monocytes Relative: 6 % (ref 3–12)
Neutro Abs: 5.7 10*3/uL (ref 1.7–7.7)
Neutrophils Relative %: 68 % (ref 43–77)

## 2012-01-01 LAB — CBC
HCT: 39.1 % (ref 36.0–46.0)
Hemoglobin: 13.3 g/dL (ref 12.0–15.0)
MCH: 29.3 pg (ref 26.0–34.0)
MCHC: 34 g/dL (ref 30.0–36.0)
MCV: 86.1 fL (ref 78.0–100.0)
Platelets: 285 10*3/uL (ref 150–400)
RBC: 4.54 MIL/uL (ref 3.87–5.11)
RDW: 12.5 % (ref 11.5–15.5)
WBC: 8.4 10*3/uL (ref 4.0–10.5)

## 2012-01-01 LAB — PREGNANCY, URINE: Preg Test, Ur: NEGATIVE

## 2012-01-01 MED ORDER — CEFTRIAXONE SODIUM 250 MG IJ SOLR
250.0000 mg | Freq: Once | INTRAMUSCULAR | Status: AC
  Administered 2012-01-01: 250 mg via INTRAMUSCULAR
  Filled 2012-01-01: qty 250

## 2012-01-01 MED ORDER — ONDANSETRON HCL 4 MG/2ML IJ SOLN
4.0000 mg | Freq: Once | INTRAMUSCULAR | Status: AC
  Administered 2012-01-01: 4 mg via INTRAVENOUS

## 2012-01-01 MED ORDER — ONDANSETRON HCL 4 MG/2ML IJ SOLN
INTRAMUSCULAR | Status: AC
  Filled 2012-01-01: qty 2

## 2012-01-01 MED ORDER — MORPHINE SULFATE 4 MG/ML IJ SOLN
6.0000 mg | Freq: Once | INTRAMUSCULAR | Status: AC
  Administered 2012-01-01: 6 mg via INTRAVENOUS
  Filled 2012-01-01: qty 2

## 2012-01-01 MED ORDER — ONDANSETRON HCL 4 MG/2ML IJ SOLN
4.0000 mg | Freq: Once | INTRAMUSCULAR | Status: AC
  Administered 2012-01-01: 4 mg via INTRAVENOUS
  Filled 2012-01-01: qty 2

## 2012-01-01 MED ORDER — SODIUM CHLORIDE 0.9 % IV BOLUS (SEPSIS)
1000.0000 mL | Freq: Once | INTRAVENOUS | Status: AC
  Administered 2012-01-01: 1000 mL via INTRAVENOUS

## 2012-01-01 MED ORDER — ONDANSETRON 8 MG PO TBDP
8.0000 mg | ORAL_TABLET | Freq: Once | ORAL | Status: DC
  Filled 2012-01-01: qty 1

## 2012-01-01 MED ORDER — HYDROCODONE-ACETAMINOPHEN 10-500 MG PO TABS
1.0000 | ORAL_TABLET | Freq: Four times a day (QID) | ORAL | Status: AC | PRN
Start: 2012-01-01 — End: 2012-01-11

## 2012-01-01 MED ORDER — LIDOCAINE HCL 1 % IJ SOLN
INTRAMUSCULAR | Status: AC
  Administered 2012-01-01: 2.7 mL
  Filled 2012-01-01: qty 20

## 2012-01-01 MED ORDER — DOXYCYCLINE HYCLATE 100 MG PO CAPS: 100.0000 mg | ORAL_CAPSULE | Freq: Two times a day (BID) | ORAL | Status: AC

## 2012-01-01 NOTE — Discharge Instructions (Signed)
Follow up with your GYN doctor and GI. Return here as needed.

## 2012-01-01 NOTE — Telephone Encounter (Signed)
This is listed as a phone encounter, but I saw her and examined her in the ER  Reason for Consult:abdominal pain Referring Physician: Medoff  [name] [name] is an 26 y.o. female.  HPI: seen earlier today by Dr. Kinnie Scales and referred to ER to r/o appendicitis.  She has had this pain in 2011 and it restarted 1 month ago and has occurred several times since.  She says that this is no worse today than those episodes.  She describes this as sharp pain starting in the right flank radiating to RLQ.  Low grade temps but no fevers. +nausea.  No association with menstrual cycle.  She is eating well.  She has had pelvic US and 2 CT's in the last month both negative for appendicitis or other cause.  She does say that she has had blood in her urine.  Denies any extramarital sexual relations.    Past Medical History  Diagnosis Date  . Nerve damage     left femoral nerve  . Asthma     Past Surgical History  Procedure Date  . Knee surgery     left knee  . Cholecystectomy   . 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 Wisdom tooth extraction     No family history on file.  Social History:  reports that she has never smoked. She has never used smokeless tobacco. She reports that she drinks alcohol. She reports that she does not use illicit drugs.  Allergies:  Allergies  Allergen Reactions  . Tylenol With Codeine #3 (Acetaminophen-Codeine) Palpitations    Medications: I have reviewed the patient's current medications.  @RESULTS48 @  Ct Abdomen Pelvis W Contrast  01/01/2012  *RADIOLOGY REPORT*  Clinical Data: Right lower quadrant abdominal pain.  CT ABDOMEN AND PELVIS WITH CONTRAST  Technique:  Multidetector CT imaging of the abdomen and pelvis was performed following the standard protocol during bolus administration of intravenous contrast.  Contrast:  100 ml Omnipaque-300  Comparison: 12/15/2011 noncontrast CT Findings: Limited images through the lung bases demonstrate no significant  appreciable abnormality. The heart size is within normal limits. No pleural or pericardial effusion.  There is mild intra and extrahepatic biliary ductal prominence to the level of the ampulla.  This is nonspecific status post cholecystectomy. Focal hypoattenuation adjacent to the falciform is favored to represent focal fat.  Liver otherwise unremarkable. Unremarkable spleen, pancreas, adrenal glands.  Symmetric renal enhancement.  No hydronephrosis or hydroureter.  No bowel obstruction.  No CT evidence for colitis.  Colonic diverticulosis.  Normal appendix. Unremarkable TI.  No free intraperitoneal air or fluid.  No lymphadenopathy.  Normal caliber vasculature.  Thin-walled bladder.  Unremarkable CT appearance to the uterus and adnexa.  No acute osseous finding.  IMPRESSION: Minimal intra and extrahepatic biliary ductal prominence, nonspecific status post cholecystectomy.  Correlate with LFTs and ERCP / MRCP if clinically warranted.  Otherwise, no acute abnormality identified by CT.  Original Report Authenticated By: Waneta Martins, M.D.  All other review of systems negative or noncontributory except as stated in the HPI  General appearance: alert, cooperative and no distress Resp: nonlabored GI: soft, tender in suprapubic and RLQ, ND, no peritonitis, no hernias on exam  Assessment/Plan: Abdominal pain. This is problematic because it does not seem to be appendicitis but there is no other cause found as well.  I would recommend evaluation by gyn, as well as urology evaluation given flank pain radiation to rlq and history of hematuria, also colonoscopy  may be beneficial.  Right now, I do not see any evidence of acute appendicitis except for RLQ pain.  CT and labs normal, and history is not consistent with acute appendicitis as well.  I offered admission for observation but she declined.  For now, I would continue with the workup.  Lodema Pilot DAVID 01/01/2012, 7:11 PM

## 2012-01-01 NOTE — ED Notes (Signed)
Pt states she went to her pcp and was sent to the ed for further evaluation. Pt states she has had right lower quad pain for about a month. Pt had a ct perform at her pcp and sent for possible appendicitis. Pt c/o n/v when pain comes and goes

## 2012-01-01 NOTE — ED Notes (Signed)
Patient transported to CT 

## 2012-01-01 NOTE — ED Provider Notes (Signed)
History     CSN: [id number]  Arrival date & time 01/01/12  1148   First MD Initiated Contact with Patient 01/01/12 1156      Chief Complaint  Patient presents with  . Abdominal Pain    (Consider location/radiation/quality/duration/timing/severity/associated sxs/prior treatment) HPI Patient presents emergency department with lower abdominal pain for the last one month to see her PCP initially and a CT scan was performed which did not show any abnormality.  Patient then was seen in the emergency department, and blood testing, along with a ultrasound was performed and there is no abnormalities noted other than an elevated white count.  Patient was seen at her GI doctor today who sent her to the emergency department for presumed appendicitis.  Patient, states that the pain continues even though she was prescribed pain medications.  Patient denies chest pain, shortness of breath, vomiting, weakness, fever, dizziness, vaginal bleeding, or diarrhea.  Past Medical History  Diagnosis Date  . Nerve damage     left femoral nerve  . Asthma     Past Surgical History  Procedure Date  . Knee surgery     left knee  . Cholecystectomy   . 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 Wisdom tooth extraction     No family history on file.  History  Substance Use Topics  . Smoking status: Never Smoker   . Smokeless tobacco: Never Used  . Alcohol Use: Yes     socially    OB History    Grav Para Term Preterm Abortions TAB SAB Ect Mult Living                  Review of Systems All other systems negative except as documented in the HPI. All pertinent positives and negatives as reviewed in the HPI.  Allergies  Tylenol with codeine #3  Home Medications   Current Outpatient Rx  Name Route Sig Dispense Refill  . ALUMINUM & MAGNESIUM HYDROXIDE 225-200 MG/5ML PO SUSP Oral Take 15 mLs by mouth every 6 (six) hours as needed. Heartburn    . IBUPROFEN 200 MG PO TABS Oral Take 600 mg by  mouth every 6 (six) hours as needed. For pain    . LEVOCETIRIZINE DIHYDROCHLORIDE 5 MG PO TABS Oral Take 5 mg by mouth every evening.    . ADULT MULTIVITAMIN W/MINERALS CH Oral Take 1 tablet by mouth daily.    Kathrynn Running ESTRAD-FE BIPHAS 1 MG-10 MCG / 10 MCG PO TABS Oral Take 1 tablet by mouth daily.    Marland Kitchen ONDANSETRON HCL 8 MG PO TABS Oral Take 8 mg by mouth every 8 (eight) hours as needed. For nausea    . OXYCODONE-ACETAMINOPHEN 5-325 MG PO TABS Oral Take 1 tablet by mouth every 6 (six) hours as needed.    Marland Kitchen PENTAZOCINE-NALOXONE HCL 50-0.5 MG PO TABS Oral Take 1 tablet by mouth every 4 (four) hours as needed. Pain      BP 133/76  Pulse 82  Temp 98.5 F (36.9 C) (Oral)  Resp 20  Ht 5' (1.524 m)  Wt 200 lb (90.719 kg)  BMI 39.06 kg/m2  SpO2 100%  LMP 12/01/2011  Physical Exam  Nursing note and vitals reviewed. Constitutional: She is oriented to person, place, and time. She appears well-developed and well-nourished. No distress.  HENT:  Head: Normocephalic and atraumatic.  Eyes: Pupils are equal, round, and reactive to light.  Cardiovascular: Normal rate and regular rhythm.   Pulmonary/Chest:  Effort normal and breath sounds normal.  Abdominal: Soft. Bowel sounds are normal. There is tenderness in the right lower quadrant, suprapubic area and left lower quadrant. There is no rigidity, no rebound, no guarding and no tenderness at McBurney's point.    Genitourinary: Uterus normal. Pelvic exam was performed with patient supine. There is no rash, tenderness or lesion on the right labia. There is no rash, tenderness or lesion on the left labia. Cervix exhibits no motion tenderness and no discharge. Right adnexum displays tenderness. Right adnexum displays no mass and no fullness. Left adnexum displays tenderness. Left adnexum displays no mass and no fullness. There is tenderness and bleeding around the vagina. No erythema around the vagina. No foreign body around the vagina. No signs of  injury around the vagina. No vaginal discharge found.  Neurological: She is alert and oriented to person, place, and time.  Skin: Skin is warm and dry. No rash noted.    ED Course  Procedures (including critical care time)  Labs Reviewed  COMPREHENSIVE METABOLIC PANEL - Abnormal; Notable for the following:    Total Bilirubin 0.2 (*)     All other components within normal limits  URINALYSIS, ROUTINE W REFLEX MICROSCOPIC - Abnormal; Notable for the following:    Hgb urine dipstick TRACE (*)     All other components within normal limits  CBC  DIFFERENTIAL  PREGNANCY, URINE  URINE MICROSCOPIC-ADD ON   Ct Abdomen Pelvis W Contrast  01/01/2012  *RADIOLOGY REPORT*  Clinical Data: Right lower quadrant abdominal pain.  CT ABDOMEN AND PELVIS WITH CONTRAST  Technique:  Multidetector CT imaging of the abdomen and pelvis was performed following the standard protocol during bolus administration of intravenous contrast.  Contrast:  100 ml Omnipaque-300  Comparison: 12/15/2011 noncontrast CT Findings: Limited images through the lung bases demonstrate no significant appreciable abnormality. The heart size is within normal limits. No pleural or pericardial effusion.  There is mild intra and extrahepatic biliary ductal prominence to the level of the ampulla.  This is nonspecific status post cholecystectomy. Focal hypoattenuation adjacent to the falciform is favored to represent focal fat.  Liver otherwise unremarkable. Unremarkable spleen, pancreas, adrenal glands.  Symmetric renal enhancement.  No hydronephrosis or hydroureter.  No bowel obstruction.  No CT evidence for colitis.  Colonic diverticulosis.  Normal appendix. Unremarkable TI.  No free intraperitoneal air or fluid.  No lymphadenopathy.  Normal caliber vasculature.  Thin-walled bladder.  Unremarkable CT appearance to the uterus and adnexa.  No acute osseous finding.  IMPRESSION: Minimal intra and extrahepatic biliary ductal prominence, nonspecific  status post cholecystectomy.  Correlate with LFTs and ERCP / MRCP if clinically warranted.  Otherwise, no acute abnormality identified by CT.  Original Report Authenticated By: Waneta Martins, M.D.    I thoroughly reviewed the patient's testing with her and advised her that since she did have some tenderness on vaginal exam that we should preemptively treat as though there is a low level pelvic infection.  I advised the, patient, she will need to follow with her primary care doctor, along with her GYN for further evaluation.  Dr. Biagio Quint from surgery was consulted by the GI doctor, which she saw earlier in the day.  Dr. Biagio Quint came down based on this call from the GI doctor and spoke with the patient, but felt like there is nothing at this time that he needed to do at this time.  Patient is given treatment for PID and advised to followup.  Patient, brought up  to me when speaking with her about her results about the possibility of PID.  Patient is advised to return here for any worsening in her condition.     MDM  MDM Reviewed: vitals and nursing note Reviewed previous: labs, ultrasound and CT scan Interpretation: labs, CT scan and ultrasound Consults: general surgery            Carlyle Dolly, PA-C 01/01/12 1941

## 2012-01-02 LAB — GC/CHLAMYDIA PROBE AMP, GENITAL
Chlamydia, DNA Probe: NEGATIVE
GC Probe Amp, Genital: NEGATIVE

## 2012-01-02 NOTE — ED Provider Notes (Signed)
Medical screening examination/treatment/procedure(s) were performed by non-physician practitioner and as supervising physician I was immediately available for consultation/collaboration.  Arali Somera, MD 01/02/12 0549 

## 2012-12-26 ENCOUNTER — Other Ambulatory Visit: Payer: Self-pay | Admitting: Gastroenterology

## 2012-12-26 DIAGNOSIS — R110 Nausea: Secondary | ICD-10-CM

## 2012-12-26 DIAGNOSIS — K5792 Diverticulitis of intestine, part unspecified, without perforation or abscess without bleeding: Secondary | ICD-10-CM

## 2012-12-29 ENCOUNTER — Other Ambulatory Visit: Payer: 59

## 2012-12-30 ENCOUNTER — Ambulatory Visit
Admission: RE | Admit: 2012-12-30 | Discharge: 2012-12-30 | Disposition: A | Discharge status: Home / Self Care | Payer: 59 | Source: Ambulatory Visit | Attending: Gastroenterology | Admitting: Gastroenterology

## 2012-12-30 DIAGNOSIS — K5792 Diverticulitis of intestine, part unspecified, without perforation or abscess without bleeding: Secondary | ICD-10-CM

## 2012-12-30 DIAGNOSIS — R110 Nausea: Secondary | ICD-10-CM

## 2012-12-30 MED ORDER — IOHEXOL 300 MG/ML  SOLN
100.0000 mL | Freq: Once | INTRAMUSCULAR | Status: AC | PRN
  Administered 2012-12-30: 100 mL via INTRAVENOUS

## 2013-02-10 ENCOUNTER — Ambulatory Visit (HOSPITAL_BASED_OUTPATIENT_CLINIC_OR_DEPARTMENT_OTHER)
Admission: RE | Admit: 2013-02-10 | Discharge: 2013-02-10 | Disposition: A | Discharge status: Home / Self Care | Payer: 59 | Source: Ambulatory Visit | Attending: Family Medicine | Admitting: Family Medicine

## 2013-02-10 ENCOUNTER — Other Ambulatory Visit (HOSPITAL_BASED_OUTPATIENT_CLINIC_OR_DEPARTMENT_OTHER): Payer: Self-pay | Admitting: Family Medicine

## 2013-02-10 DIAGNOSIS — K5289 Other specified noninfective gastroenteritis and colitis: Secondary | ICD-10-CM | POA: Insufficient documentation

## 2013-02-10 DIAGNOSIS — R1031 Right lower quadrant pain: Secondary | ICD-10-CM

## 2013-02-10 MED ORDER — IOHEXOL 300 MG/ML  SOLN
100.0000 mL | Freq: Once | INTRAMUSCULAR | Status: AC | PRN
  Administered 2013-02-10: 100 mL via INTRAVENOUS

## 2014-03-21 ENCOUNTER — Encounter (HOSPITAL_BASED_OUTPATIENT_CLINIC_OR_DEPARTMENT_OTHER): Payer: Self-pay | Admitting: Emergency Medicine

## 2014-03-21 ENCOUNTER — Emergency Department (HOSPITAL_BASED_OUTPATIENT_CLINIC_OR_DEPARTMENT_OTHER)
Admission: EM | Admit: 2014-03-21 | Discharge: 2014-03-21 | Disposition: A | Discharge status: Home / Self Care | Payer: 59 | Attending: Emergency Medicine | Admitting: Emergency Medicine

## 2014-03-21 DIAGNOSIS — N390 Urinary tract infection, site not specified: Secondary | ICD-10-CM | POA: Insufficient documentation

## 2014-03-21 DIAGNOSIS — Z9889 Other specified postprocedural states: Secondary | ICD-10-CM | POA: Insufficient documentation

## 2014-03-21 DIAGNOSIS — J45909 Unspecified asthma, uncomplicated: Secondary | ICD-10-CM | POA: Insufficient documentation

## 2014-03-21 DIAGNOSIS — Z3202 Encounter for pregnancy test, result negative: Secondary | ICD-10-CM | POA: Diagnosis not present

## 2014-03-21 DIAGNOSIS — Z87828 Personal history of other (healed) physical injury and trauma: Secondary | ICD-10-CM | POA: Diagnosis not present

## 2014-03-21 DIAGNOSIS — Z8719 Personal history of other diseases of the digestive system: Secondary | ICD-10-CM | POA: Diagnosis not present

## 2014-03-21 DIAGNOSIS — R1084 Generalized abdominal pain: Secondary | ICD-10-CM | POA: Diagnosis not present

## 2014-03-21 DIAGNOSIS — Z79899 Other long term (current) drug therapy: Secondary | ICD-10-CM | POA: Diagnosis not present

## 2014-03-21 HISTORY — DX: Noninfective gastroenteritis and colitis, unspecified: K52.9

## 2014-03-21 HISTORY — DX: Diverticulitis of intestine, part unspecified, without perforation or abscess without bleeding: K57.92

## 2014-03-21 HISTORY — DX: Dorsalgia, unspecified: M54.9

## 2014-03-21 LAB — COMPREHENSIVE METABOLIC PANEL
ALT: 11 U/L (ref 0–35)
AST: 16 U/L (ref 0–37)
Albumin: 3.2 g/dL — ABNORMAL LOW (ref 3.5–5.2)
Alkaline Phosphatase: 63 U/L (ref 39–117)
Anion gap: 13 (ref 5–15)
BUN: 11 mg/dL (ref 6–23)
CO2: 24 mEq/L (ref 19–32)
Calcium: 9.9 mg/dL (ref 8.4–10.5)
Chloride: 103 mEq/L (ref 96–112)
Creatinine, Ser: 0.9 mg/dL (ref 0.50–1.10)
GFR calc Af Amer: >90 mL/min (ref >90)
GFR calc non Af Amer: 87 mL/min — ABNORMAL LOW (ref >90)
Glucose, Bld: 107 mg/dL — ABNORMAL HIGH (ref 70–99)
Potassium: 4.1 mEq/L (ref 3.7–5.3)
Sodium: 140 mEq/L (ref 137–147)
Total Bilirubin: 0.2 mg/dL — ABNORMAL LOW (ref 0.3–1.2)
Total Protein: 7.4 g/dL (ref 6.0–8.3)

## 2014-03-21 LAB — URINALYSIS, ROUTINE W REFLEX MICROSCOPIC
Bilirubin Urine: NEGATIVE
Glucose, UA: NEGATIVE mg/dL
Ketones, ur: NEGATIVE mg/dL
Nitrite: POSITIVE — AB
Protein, ur: NEGATIVE mg/dL
Specific Gravity, Urine: 1.013 (ref 1.005–1.030)
Urobilinogen, UA: 0.2 mg/dL (ref 0.0–1.0)
pH: 6 (ref 5.0–8.0)

## 2014-03-21 LAB — URINE MICROSCOPIC-ADD ON

## 2014-03-21 LAB — CBC WITH DIFFERENTIAL/PLATELET
Basophils Absolute: 0 10*3/uL (ref 0.0–0.1)
Basophils Relative: 1 % (ref 0–1)
Eosinophils Absolute: 0.1 10*3/uL (ref 0.0–0.7)
Eosinophils Relative: 1 % (ref 0–5)
HCT: 40.4 % (ref 36.0–46.0)
Hemoglobin: 13.7 g/dL (ref 12.0–15.0)
Lymphocytes Relative: 28 % (ref 12–46)
Lymphs Abs: 2.3 10*3/uL (ref 0.7–4.0)
MCH: 28.4 pg (ref 26.0–34.0)
MCHC: 33.9 g/dL (ref 30.0–36.0)
MCV: 83.8 fL (ref 78.0–100.0)
Monocytes Absolute: 0.7 10*3/uL (ref 0.1–1.0)
Monocytes Relative: 8 % (ref 3–12)
Neutro Abs: 5.1 10*3/uL (ref 1.7–7.7)
Neutrophils Relative %: 62 % (ref 43–77)
Platelets: 340 10*3/uL (ref 150–400)
RBC: 4.82 MIL/uL (ref 3.87–5.11)
RDW: 13.3 % (ref 11.5–15.5)
WBC: 8.2 10*3/uL (ref 4.0–10.5)

## 2014-03-21 LAB — LIPASE, BLOOD: Lipase: 39 U/L (ref 11–59)

## 2014-03-21 LAB — PREGNANCY, URINE: Preg Test, Ur: NEGATIVE

## 2014-03-21 MED ORDER — CILIDINIUM-CHLORDIAZEPOXIDE 2.5-5 MG PO CAPS: 1.0000 | ORAL_CAPSULE | Freq: Three times a day (TID) | ORAL | Status: DC

## 2014-03-21 MED ORDER — SODIUM CHLORIDE 0.9 % IV BOLUS (SEPSIS)
1000.0000 mL | Freq: Once | INTRAVENOUS | Status: AC
  Administered 2014-03-21: 1000 mL via INTRAVENOUS

## 2014-03-21 MED ORDER — OXYCODONE-ACETAMINOPHEN 5-325 MG PO TABS: 1.0000 | ORAL_TABLET | Freq: Three times a day (TID) | ORAL | Status: DC | PRN

## 2014-03-21 MED ORDER — HYDROMORPHONE HCL PF 1 MG/ML IJ SOLN
1.0000 mg | Freq: Once | INTRAMUSCULAR | Status: AC
  Administered 2014-03-21: 1 mg via INTRAVENOUS
  Filled 2014-03-21: qty 1

## 2014-03-21 MED ORDER — CIPROFLOXACIN HCL 500 MG PO TABS: 500.0000 mg | ORAL_TABLET | Freq: Two times a day (BID) | ORAL | Status: DC

## 2014-03-21 MED ORDER — CIPROFLOXACIN HCL 500 MG PO TABS
500.0000 mg | ORAL_TABLET | Freq: Once | ORAL | Status: AC
  Administered 2014-03-21: 500 mg via ORAL
  Filled 2014-03-21: qty 1

## 2014-03-21 MED ORDER — ONDANSETRON HCL 4 MG/2ML IJ SOLN
4.0000 mg | Freq: Once | INTRAMUSCULAR | Status: AC
  Administered 2014-03-21: 4 mg via INTRAVENOUS
  Filled 2014-03-21: qty 2

## 2014-03-21 NOTE — ED Notes (Signed)
Bil low abd pain x4 days with N/V/D.  Hx of Diverticulitis and Colitis.  Cannot get in with GI doc for 2 weeks.  Pt sts this is her 4th flare in 1 year.

## 2014-03-21 NOTE — ED Notes (Signed)
MD at bedside. 

## 2014-03-21 NOTE — Discharge Instructions (Signed)
As discussed, your evaluation today has been largely reassuring.  But, it is important that you monitor your condition carefully, and do not hesitate to return to the ED if you develop new, or concerning changes in your condition. ? ?Otherwise, please follow-up with your physician for appropriate ongoing care. ? ?

## 2014-03-21 NOTE — ED Provider Notes (Signed)
CSN: [id number]     Arrival date & time 03/21/14  1735 History  This chart was scribed for [name] Munch, MD by Modena Jansky, ED Scribe. This patient was seen in room MH04/MH04 and the patient's care was started at 6:40 PM.  Chief Complaint  Patient presents with  . Abdominal Pain   The history is provided by the patient. No language interpreter was used.   HPI Comments: [name] [name] is a 28 y.o. female with a hx of asthma, colitis, and diverticulitis who presents to the Emergency Department complaining of bilateral lower constant moderate abdominal pain that started about 4 days ago. She reports that she could not get an appointment with her gastroenterologist so she went to her PCP. She states that she was given medication and told to come to the ED if symptoms persist. She reports associated low grade subjective fever, nausea, emesis, and diarrhea. She denies any blood in stool or emesis. She reports prior hx of abdominal pain. She states that Librax gave her relief from cramps but she ran out. She reports a hx of left knee injury and cholecystectomy.   Gastroenterologist- Medoff Past Medical History  Diagnosis Date  . Nerve damage     left femoral nerve  . Asthma   . Colitis   . Diverticulitis   . Back pain    Past Surgical History  Procedure Laterality Date  . Knee surgery      left knee  . Cholecystectomy    . 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 Wisdom tooth extraction     No family history on file. History  Substance Use Topics  . Smoking status: Never Smoker   . Smokeless tobacco: Never Used  . Alcohol Use: Yes     Comment: socially   OB History   Grav Para Term Preterm Abortions TAB SAB Ect Mult Living                 Review of Systems  Constitutional:       Per HPI, otherwise negative  HENT:       Per HPI, otherwise negative  Respiratory:       Per HPI, otherwise negative  Cardiovascular:       Per HPI, otherwise negative   Gastrointestinal: Positive for nausea, vomiting, abdominal pain and diarrhea. Negative for blood in stool.  Endocrine:       Negative aside from HPI  Genitourinary:       Neg aside from HPI   Musculoskeletal:       Per HPI, otherwise negative  Skin: Negative.   Neurological: Negative for syncope.    Allergies  Tylenol with codeine #3  Home Medications   Prior to Admission medications   Medication Sig Start Date End Date Taking? Authorizing Provider  citalopram (CELEXA) 40 MG tablet Take 40 mg by mouth daily.   Yes Historical Provider, MD  clidinium-chlordiazePOXIDE (LIBRAX) 5-2.5 MG per capsule Take 1 capsule by mouth.   Yes Historical Provider, MD  clonazePAM (KLONOPIN) 0.5 MG tablet Take 0.25 mg by mouth at bedtime.   Yes Historical Provider, MD  cyclobenzaprine (FLEXERIL) 10 MG tablet Take 10 mg by mouth 3 (three) times daily as needed for muscle spasms.   Yes Historical Provider, MD  promethazine (PHENERGAN) 25 MG tablet Take 25 mg by mouth every 6 (six) hours as needed for nausea or vomiting.   Yes Historical Provider, MD  traMADol (ULTRAM) 50 MG tablet  Take by mouth every 6 (six) hours as needed.   Yes Historical Provider, MD  aluminum & magnesium hydroxide (MAALOX) 225-200 MG/5ML suspension Take 15 mLs by mouth every 6 (six) hours as needed. Heartburn    Historical Provider, MD  ibuprofen (ADVIL,MOTRIN) 200 MG tablet Take 600 mg by mouth every 6 (six) hours as needed. For pain    Historical Provider, MD  levocetirizine (XYZAL) 5 MG tablet Take 5 mg by mouth every evening.    Historical Provider, MD  Multiple Vitamin (MULTIVITAMIN WITH MINERALS) TABS Take 1 tablet by mouth daily.    Historical Provider, MD  Norethindrone-Ethinyl Estradiol-Fe Biphas (LO LOESTRIN FE) 1 MG-10 MCG / 10 MCG tablet Take 1 tablet by mouth daily.    Historical Provider, MD  ondansetron (ZOFRAN) 8 MG tablet Take 4 mg by mouth every 8 (eight) hours as needed. For nausea    Historical Provider, MD   oxyCODONE-acetaminophen (PERCOCET) 5-325 MG per tablet Take 1 tablet by mouth every 6 (six) hours as needed.    Historical Provider, MD  pentazocine-naloxone (TALWIN NX) 50-0.5 MG per tablet Take 1 tablet by mouth every 4 (four) hours as needed. Pain    Historical Provider, MD   BP 142/88  Pulse 90  Temp(Src) 98.9 F (37.2 C) (Oral)  Resp 18  Ht 5' (1.524 m)  Wt 194 lb (87.998 kg)  BMI 37.89 kg/m2  SpO2 99%  LMP 01/17/2014 Physical Exam  Nursing note and vitals reviewed. Constitutional: She is oriented to person, place, and time. She appears well-developed and well-nourished. No distress.  HENT:  Head: Normocephalic and atraumatic.  Eyes: Conjunctivae and EOM are normal.  Cardiovascular: Normal rate and regular rhythm.   Pulmonary/Chest: Effort normal and breath sounds normal. No stridor. No respiratory distress.  Abdominal: She exhibits no distension. There is tenderness.  Diffusely mildly tender with no peritoneal findings. Pt has belly button piercing.   Musculoskeletal: She exhibits no edema.  Neurological: She is alert and oriented to person, place, and time. No cranial nerve deficit.  Skin: Skin is warm and dry.  Psychiatric: She has a normal mood and affect.    ED Course  Procedures (including critical care time)  COORDINATION OF CARE: 6:44 PM- Pt advised of plan for treatment which includes medication, radiology, and labs and pt agrees.  Labs Review Labs Reviewed  URINALYSIS, ROUTINE W REFLEX MICROSCOPIC - Abnormal; Notable for the following:    Hgb urine dipstick TRACE (*)    Nitrite POSITIVE (*)    Leukocytes, UA TRACE (*)    All other components within normal limits  URINE MICROSCOPIC-ADD ON - Abnormal; Notable for the following:    Bacteria, UA FEW (*)    All other components within normal limits  COMPREHENSIVE METABOLIC PANEL - Abnormal; Notable for the following:    Glucose, Bld 107 (*)    Albumin 3.2 (*)    Total Bilirubin 0.2 (*)    GFR calc non Af  Amer 87 (*)    All other components within normal limits  PREGNANCY, URINE  CBC WITH DIFFERENTIAL  LIPASE, BLOOD    Imaging Review No results found.   EKG Interpretation None     On repeat exam the patient is in no distress. We discussed all findings, the with primary care in gastroenterology.  MDM   I personally performed the services described in this documentation, which was scribed in my presence. The recorded information has been reviewed and is accurate.    Patient presents with  abdominal pain, nausea, vomiting or diarrhea.  Patient has a soft, though tender abdomen, with no evidence of peritonitis. Patient's evaluation demonstrates urinary tract infection. The patient does have right upper quadrant pain in her entire abdomen. Low suspicion for appendicitis given the reassuring labs, absence of fever. We had a lengthy conversation about the patient's recurrent episodes of colitis versus diverticulitis as well as his ongoing abdominal pain. With multiple recent CT studies, and an improvement of her pain, additional imaging was not indicated given the concern for radiation exposure. With resolution of her pain, she was discharged in stable condition   [name] Munch, MD 03/21/14 1954

## 2014-05-07 ENCOUNTER — Other Ambulatory Visit: Payer: Self-pay | Admitting: Gastroenterology

## 2014-05-07 DIAGNOSIS — R1032 Left lower quadrant pain: Secondary | ICD-10-CM

## 2014-05-08 ENCOUNTER — Ambulatory Visit
Admission: RE | Admit: 2014-05-08 | Discharge: 2014-05-08 | Disposition: A | Discharge status: Home / Self Care | Payer: 59 | Source: Ambulatory Visit | Attending: Gastroenterology | Admitting: Gastroenterology

## 2014-05-08 DIAGNOSIS — R1032 Left lower quadrant pain: Secondary | ICD-10-CM

## 2014-05-08 MED ORDER — IOHEXOL 300 MG/ML  SOLN
100.0000 mL | Freq: Once | INTRAMUSCULAR | Status: AC | PRN
  Administered 2014-05-08: 100 mL via INTRAVENOUS

## 2014-06-06 ENCOUNTER — Other Ambulatory Visit: Payer: Self-pay | Admitting: Gastroenterology

## 2014-06-13 ENCOUNTER — Ambulatory Visit: Payer: 59 | Admitting: Gastroenterology

## 2014-07-07 ENCOUNTER — Telehealth: Payer: Self-pay | Admitting: Nurse Practitioner

## 2014-07-07 DIAGNOSIS — J029 Acute pharyngitis, unspecified: Secondary | ICD-10-CM

## 2014-07-07 MED ORDER — AMOXICILLIN 875 MG PO TABS: 875.0000 mg | ORAL_TABLET | Freq: Two times a day (BID) | ORAL | Status: DC

## 2014-07-07 NOTE — Progress Notes (Signed)
We are sorry that you are not feeling well.  Here is how we plan to help!  Based on what you have shared with me it looks like you have sinusitis.  Sinusitis is inflammation and infection in the sinus cavities of the head.  Based on your presentation I believe you most likely have Acute Bacterial PHARYNGITIS.  This is an infection caused by bacteria and is treated with antibiotics.  I have prescribed amoxicillin, an antibiotic in the penicillin family, one tablet twice daily with food, for 7 days.  You may use an oral decongestant such as Mucinex D or if you have glaucoma or high blood pressure use plain Mucinex.  Saline nasal sprays help an can sefely be used as often as needed for congestion.  If you develop worsening sinus pain, fever or notice severe headache and vision changes, or if symptoms are not better after completion of antibiotic, please schedule an appointment with a health care provider.  Sinus infections are not as easily transmitted as other respiratory infection, however we still recommend that you avoid close contact with loved ones, especially the very young and elderly.  Remember to wash your hands thoroughly throughout the day as this is the number one way to prevent the spread of infection!  Home Care:  Only take medications as instructed by your medical team.  Complete the entire course of an antibiotic.  Do not take these medications with alcohol.  A steam or ultrasonic humidifier can help congestion.  You can place a towel over your head and breathe in the steam from hot water coming from a faucet.  Avoid close contacts especially the very young and the elderly.  Cover your mouth when you cough or sneeze.  Always remember to wash your hands.  Get Help Right Away If:  You develop worsening fever or sinus pain.  You develop a severe head ache or visual changes.  Your symptoms persist after you have completed your treatment plan.  Make sure you  Understand these  instructions.  Will watch your condition.  Will get help right away if you are not doing well or get worse.  Your e-visit answers were reviewed by a board certified advanced clinical practitioner to complete your personal care plan.  Depending on the condition, your plan could have included both over the counter or prescription medications.  If there is a problem please reply  once you have received a response from your provider.  Your safety is important to us.  If you have drug allergies check your prescription carefully.    You can use MyChart to ask questions about today's visit, request a non-urgent call back, or ask for a work or school excuse.  You will get an e-mail in the next two days asking about your experience.  I hope that your e-visit has been valuable and will speed your recovery. Thank you for using e-visits.

## 2015-05-04 ENCOUNTER — Emergency Department (HOSPITAL_COMMUNITY)
Admission: EM | Admit: 2015-05-04 | Discharge: 2015-05-04 | Disposition: A | Discharge status: Home / Self Care | Payer: 59 | Attending: Emergency Medicine | Admitting: Emergency Medicine

## 2015-05-04 ENCOUNTER — Encounter (HOSPITAL_COMMUNITY): Payer: Self-pay | Admitting: Emergency Medicine

## 2015-05-04 DIAGNOSIS — Z87828 Personal history of other (healed) physical injury and trauma: Secondary | ICD-10-CM | POA: Insufficient documentation

## 2015-05-04 DIAGNOSIS — Z3202 Encounter for pregnancy test, result negative: Secondary | ICD-10-CM | POA: Diagnosis not present

## 2015-05-04 DIAGNOSIS — K529 Noninfective gastroenteritis and colitis, unspecified: Secondary | ICD-10-CM | POA: Insufficient documentation

## 2015-05-04 DIAGNOSIS — Z79899 Other long term (current) drug therapy: Secondary | ICD-10-CM | POA: Diagnosis not present

## 2015-05-04 DIAGNOSIS — Z792 Long term (current) use of antibiotics: Secondary | ICD-10-CM | POA: Insufficient documentation

## 2015-05-04 DIAGNOSIS — J45909 Unspecified asthma, uncomplicated: Secondary | ICD-10-CM | POA: Diagnosis not present

## 2015-05-04 DIAGNOSIS — R112 Nausea with vomiting, unspecified: Secondary | ICD-10-CM

## 2015-05-04 DIAGNOSIS — R109 Unspecified abdominal pain: Secondary | ICD-10-CM

## 2015-05-04 DIAGNOSIS — R1032 Left lower quadrant pain: Secondary | ICD-10-CM | POA: Diagnosis present

## 2015-05-04 LAB — CBC WITH DIFFERENTIAL/PLATELET
Basophils Absolute: 0 10*3/uL (ref 0.0–0.1)
Basophils Relative: 0 %
Eosinophils Absolute: 0.1 10*3/uL (ref 0.0–0.7)
Eosinophils Relative: 2 %
HCT: 38.9 % (ref 36.0–46.0)
Hemoglobin: 12.9 g/dL (ref 12.0–15.0)
Lymphocytes Relative: 25 %
Lymphs Abs: 1.6 10*3/uL (ref 0.7–4.0)
MCH: 28.9 pg (ref 26.0–34.0)
MCHC: 33.2 g/dL (ref 30.0–36.0)
MCV: 87.2 fL (ref 78.0–100.0)
Monocytes Absolute: 0.8 10*3/uL (ref 0.1–1.0)
Monocytes Relative: 13 %
Neutro Abs: 3.9 10*3/uL (ref 1.7–7.7)
Neutrophils Relative %: 60 %
Platelets: 287 10*3/uL (ref 150–400)
RBC: 4.46 MIL/uL (ref 3.87–5.11)
RDW: 12.7 % (ref 11.5–15.5)
WBC: 6.5 10*3/uL (ref 4.0–10.5)

## 2015-05-04 LAB — COMPREHENSIVE METABOLIC PANEL
ALT: 40 U/L (ref 14–54)
AST: 37 U/L (ref 15–41)
Albumin: 4 g/dL (ref 3.5–5.0)
Alkaline Phosphatase: 80 U/L (ref 38–126)
Anion gap: 7 (ref 5–15)
BUN: 16 mg/dL (ref 6–20)
CO2: 26 mmol/L (ref 22–32)
Calcium: 8.8 mg/dL — ABNORMAL LOW (ref 8.9–10.3)
Chloride: 105 mmol/L (ref 101–111)
Creatinine, Ser: 0.86 mg/dL (ref 0.44–1.00)
GFR calc Af Amer: >60 mL/min (ref >60)
GFR calc non Af Amer: >60 mL/min (ref >60)
Glucose, Bld: 83 mg/dL (ref 65–99)
Potassium: 3.8 mmol/L (ref 3.5–5.1)
Sodium: 138 mmol/L (ref 135–145)
Total Bilirubin: 0.3 mg/dL (ref 0.3–1.2)
Total Protein: 7.1 g/dL (ref 6.5–8.1)

## 2015-05-04 LAB — URINALYSIS, ROUTINE W REFLEX MICROSCOPIC
Bilirubin Urine: NEGATIVE
Glucose, UA: NEGATIVE mg/dL
Hgb urine dipstick: NEGATIVE
Ketones, ur: NEGATIVE mg/dL
Nitrite: NEGATIVE
Protein, ur: NEGATIVE mg/dL
Specific Gravity, Urine: 1.026 (ref 1.005–1.030)
Urobilinogen, UA: 0.2 mg/dL (ref 0.0–1.0)
pH: 6 (ref 5.0–8.0)

## 2015-05-04 LAB — URINE MICROSCOPIC-ADD ON

## 2015-05-04 LAB — PREGNANCY, URINE: Preg Test, Ur: NEGATIVE

## 2015-05-04 LAB — LIPASE, BLOOD: Lipase: 27 U/L (ref 11–51)

## 2015-05-04 MED ORDER — SODIUM CHLORIDE 0.9 % IV BOLUS (SEPSIS)
1000.0000 mL | Freq: Once | INTRAVENOUS | Status: AC
  Administered 2015-05-04: 1000 mL via INTRAVENOUS

## 2015-05-04 MED ORDER — GI COCKTAIL ~~LOC~~
30.0000 mL | Freq: Once | ORAL | Status: AC
  Administered 2015-05-04: 30 mL via ORAL
  Filled 2015-05-04: qty 30

## 2015-05-04 MED ORDER — POLYETHYLENE GLYCOL 3350 17 G PO PACK: 17.0000 g | PACK | Freq: Every day | ORAL | Status: DC

## 2015-05-04 MED ORDER — PANTOPRAZOLE SODIUM 20 MG PO TBEC: 20.0000 mg | DELAYED_RELEASE_TABLET | Freq: Every day | ORAL | Status: DC

## 2015-05-04 MED ORDER — PROMETHAZINE HCL 25 MG/ML IJ SOLN
25.0000 mg | Freq: Once | INTRAMUSCULAR | Status: AC
  Administered 2015-05-04: 25 mg via INTRAVENOUS
  Filled 2015-05-04: qty 1

## 2015-05-04 MED ORDER — PANTOPRAZOLE SODIUM 20 MG PO TBEC
20.0000 mg | DELAYED_RELEASE_TABLET | Freq: Once | ORAL | Status: AC
  Administered 2015-05-04: 20 mg via ORAL
  Filled 2015-05-04: qty 1

## 2015-05-04 NOTE — Discharge Instructions (Signed)
Take your medications as prescribed. Continue taking your home prescription of Phenergan as needed. Follow-up with your primary care provider in the next 4-5 days. Return to the emergency department if symptoms worsen or new onset of fever, vomiting blood, blood in stool.

## 2015-05-04 NOTE — ED Notes (Signed)
Pt ambulated to BR and back to room w/o assistance 

## 2015-05-04 NOTE — ED Provider Notes (Signed)
CSN: [id number]     Arrival date & time 05/04/15  0802 History   First MD Initiated Contact with Patient 05/04/15 0809     Chief Complaint  Patient presents with  . Abdominal Pain     (Consider location/radiation/quality/duration/timing/severity/associated sxs/prior Treatment) HPI Comments: Patient is a 29 year old female with past medical history of colitis and diverticulitis who presents the ED with complaint of abdominal pain, onset 4 days. Patient reports having left lower quadrant cramping that is consistent with her chronic abdominal pain associated with colitis. She notes she has had left upper quadrant pain that she describes as intermittent sharp pain, no aggravating or alleviating factors. Reports having subjective fever, nausea and vomiting that started yesterday. Denies fever, chills, headache, sore throat or cough, SOB, CP, hematemesis, diarrhea, constipation, urinary symptoms, blood in urine or stool, vaginal bleeding, vaginal discharge, pelvic pain. She notes she has been taking Phenergan and Tums with no relief. Reports abdominal history of cholecystectomy. Pt states she is sexually active and reports she is on progesterone birth control and does not have a menstrual cycle.   Patient is a 29 y.o. female presenting with abdominal pain.  Abdominal Pain Associated symptoms: fever, nausea and vomiting     Past Medical History  Diagnosis Date  . Nerve damage     left femoral nerve  . Asthma   . Colitis   . Diverticulitis   . Back pain    Past Surgical History  Procedure Laterality Date  . Knee surgery      left knee  . Cholecystectomy    . 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 Wisdom tooth extraction     No family history on file. Social History  Substance Use Topics  . Smoking status: Never Smoker   . Smokeless tobacco: Never Used  . Alcohol Use: Yes     Comment: socially   OB History    No data available     Review of Systems  Constitutional:  Positive for fever.  Gastrointestinal: Positive for nausea, vomiting and abdominal pain.  All other systems reviewed and are negative.     Allergies  Tylenol with codeine #3  Home Medications   Prior to Admission medications   Medication Sig Start Date End Date Taking? Authorizing Provider  aluminum & magnesium hydroxide (MAALOX) 225-200 MG/5ML suspension Take 15 mLs by mouth every 6 (six) hours as needed. Heartburn    Historical Provider, MD  amoxicillin (AMOXIL) 875 MG tablet Take 1 tablet (875 mg total) by mouth 2 (two) times daily. 07/07/14   Mary-Margaret Daphine DeutscherMartin, FNP  ciprofloxacin (CIPRO) 500 MG tablet Take 1 tablet (500 mg total) by mouth 2 (two) times daily. 03/21/14   Gerhard Munchobert Lockwood, MD  citalopram (CELEXA) 40 MG tablet Take 40 mg by mouth daily.    Historical Provider, MD  clidinium-chlordiazePOXIDE (LIBRAX) 5-2.5 MG per capsule Take 1 capsule by mouth 3 (three) times daily before meals. 03/21/14   Gerhard Munchobert Lockwood, MD  clonazePAM (KLONOPIN) 0.5 MG tablet Take 0.25 mg by mouth at bedtime.    Historical Provider, MD  cyclobenzaprine (FLEXERIL) 10 MG tablet Take 10 mg by mouth 3 (three) times daily as needed for muscle spasms.    Historical Provider, MD  ibuprofen (ADVIL,MOTRIN) 200 MG tablet Take 600 mg by mouth every 6 (six) hours as needed. For pain    Historical Provider, MD  levocetirizine (XYZAL) 5 MG tablet Take 5 mg by mouth every evening.  Historical Provider, MD  Multiple Vitamin (MULTIVITAMIN WITH MINERALS) TABS Take 1 tablet by mouth daily.    Historical Provider, MD  Norethindrone-Ethinyl Estradiol-Fe Biphas (LO LOESTRIN FE) 1 MG-10 MCG / 10 MCG tablet Take 1 tablet by mouth daily.    Historical Provider, MD  ondansetron (ZOFRAN) 8 MG tablet Take 4 mg by mouth every 8 (eight) hours as needed. For nausea    Historical Provider, MD  oxyCODONE-acetaminophen (PERCOCET/ROXICET) 5-325 MG per tablet Take 1 tablet by mouth every 8 (eight) hours as needed. 03/21/14   [name], MD  pentazocine-naloxone (TALWIN NX) 50-0.5 MG per tablet Take 1 tablet by mouth every 4 (four) hours as needed. Pain    Historical Provider, MD  promethazine (PHENERGAN) 25 MG tablet Take 25 mg by mouth every 6 (six) hours as needed for nausea or vomiting.    Historical Provider, MD   BP 126/52 mmHg  Pulse 91  Temp(Src) 98.3 F (36.8 C) (Oral)  Resp 18  Ht 5' (1.524 m)  Wt 210 lb (95.255 kg)  BMI 41.01 kg/m2  SpO2 98% Physical Exam  Constitutional: She is oriented to person, place, and time. She appears well-developed and well-nourished. No distress.  HENT:  Head: Normocephalic and atraumatic.  Mouth/Throat: Oropharynx is clear and moist. No oropharyngeal exudate.  Eyes: Conjunctivae and EOM are normal. Right eye exhibits no discharge. Left eye exhibits no discharge. No scleral icterus.  Neck: Normal range of motion. Neck supple.  Cardiovascular: Normal rate, regular rhythm, normal heart sounds and intact distal pulses.   No murmur heard. Pulmonary/Chest: Effort normal and breath sounds normal. No respiratory distress. She has no wheezes. She has no rales. She exhibits no tenderness.  Abdominal: Soft. Bowel sounds are normal. She exhibits no distension and no mass. There is tenderness in the epigastric area, left upper quadrant and left lower quadrant. There is no rebound and no guarding.  Musculoskeletal: Normal range of motion. She exhibits no edema.  Lymphadenopathy:    She has no cervical adenopathy.  Neurological: She is alert and oriented to person, place, and time.  Skin: Skin is warm and dry. She is not diaphoretic.  Nursing note and vitals reviewed.   ED Course  Procedures (including critical care time) Labs Review Labs Reviewed  CBC WITH DIFFERENTIAL/PLATELET  COMPREHENSIVE METABOLIC PANEL  LIPASE, BLOOD  URINALYSIS, ROUTINE W REFLEX MICROSCOPIC (NOT AT [facility])  PREGNANCY, URINE    Imaging Review No results found. I have personally reviewed and  evaluated these images and lab results as part of my medical decision-making.  Filed Vitals:   05/04/15 1216  BP: 126/65  Pulse: 97  Temp:   Resp: 16     MDM   Final diagnoses:  Abdominal pain, unspecified abdominal location  Non-intractable vomiting with nausea, vomiting of unspecified type  Colitis    Patient presents with left lower and left upper abdominal pain with associated nausea and vomiting. History of diverticulitis colitis. She reports her left lower quadrant pain is consistent with her colitis. Patient reports regular bowel movements, no diarrhea or constipation. History of cholecystectomy. VSS. Exam revealed mild epigastric,LUQ, LLQ pain, no peritoneal signs. Pregnancy negative. Labs unremarkable. Patient given Phenergan, IV fluids, GI cocktail and PPI. Patient reports improvement of pain and nausea. I suspect sxs are likely due to pt colitis and/or gastritis vs. reflux. I do not feel that any further workup or imaging is warranted at this time. Plan to d/c pt home. Pt reports she has phenergan at home that  she will continue using as needed. Advised patient to follow up with her primary care provider.   Evaluation does not show pathology requring ongoing emergent intervention or admission. Pt is hemodynamically stable and mentating appropriately. Discussed findings/results and plan with patient/guardian, who agrees with plan. All questions answered. Return precautions discussed and outpatient follow up given.      Satira Sark [place], [place] 05/04/15 1230  Arby Barrette, MD 05/13/15 [phone]

## 2015-05-04 NOTE — ED Notes (Signed)
Pt reports left lower abdominal pain onset Wednesday; hx of diverticulitis.

## 2015-07-26 DIAGNOSIS — Z3202 Encounter for pregnancy test, result negative: Secondary | ICD-10-CM | POA: Diagnosis not present

## 2015-07-26 DIAGNOSIS — Z30430 Encounter for insertion of intrauterine contraceptive device: Secondary | ICD-10-CM | POA: Diagnosis not present

## 2015-08-09 MED FILL — CONTRAVE ER 8-90 MG TABLET: 8-90 | 30 days supply | Qty: 120 | Fill #0

## 2015-08-13 MED FILL — CHLORDIAZEPOXIDE-CLIDINIUM: 5-2.5 | 30 days supply | Qty: 60 | Fill #2

## 2015-08-22 DIAGNOSIS — E669 Obesity, unspecified: Secondary | ICD-10-CM | POA: Diagnosis not present

## 2015-08-22 DIAGNOSIS — Z713 Dietary counseling and surveillance: Secondary | ICD-10-CM | POA: Diagnosis not present

## 2015-08-22 DIAGNOSIS — Z6839 Body mass index (BMI) 39.0-39.9, adult: Secondary | ICD-10-CM | POA: Diagnosis not present

## 2015-08-22 MED FILL — PANTOPRAZOLE SOD DR 20 MG T: 20 | 90 days supply | Qty: 90 | Fill #0

## 2015-08-31 DIAGNOSIS — J069 Acute upper respiratory infection, unspecified: Secondary | ICD-10-CM | POA: Diagnosis not present

## 2015-08-31 DIAGNOSIS — R509 Fever, unspecified: Secondary | ICD-10-CM | POA: Diagnosis not present

## 2015-09-23 DIAGNOSIS — R05 Cough: Secondary | ICD-10-CM | POA: Diagnosis not present

## 2015-09-23 DIAGNOSIS — J0100 Acute maxillary sinusitis, unspecified: Secondary | ICD-10-CM | POA: Diagnosis not present

## 2015-10-10 MED FILL — CHLORDIAZEPOXIDE-CLIDINIUM: 5-2.5 | 30 days supply | Qty: 60 | Fill #3

## 2015-10-14 MED FILL — CITALOPRAM HBR 20 MG TABLET: 20 | 60 days supply | Qty: 60 | Fill #0 | Status: TO

## 2015-10-29 DIAGNOSIS — K580 Irritable bowel syndrome with diarrhea: Secondary | ICD-10-CM | POA: Diagnosis not present

## 2015-10-29 DIAGNOSIS — K5790 Diverticulosis of intestine, part unspecified, without perforation or abscess without bleeding: Secondary | ICD-10-CM | POA: Diagnosis not present

## 2015-12-05 MED FILL — CHLORDIAZEPOXIDE-CLIDINIUM: 5-2.5 | 30 days supply | Qty: 60 | Fill #4

## 2015-12-05 MED FILL — PANTOPRAZOLE SOD DR 20 MG T: 20 | 90 days supply | Qty: 90 | Fill #1

## 2015-12-05 MED FILL — OSCIMIN SL 0.125 MG TABLET: 0.125 | 6 days supply | Qty: 50 | Fill #0

## 2015-12-17 DIAGNOSIS — G43801 Other migraine, not intractable, with status migrainosus: Secondary | ICD-10-CM | POA: Diagnosis not present

## 2015-12-27 DIAGNOSIS — G43909 Migraine, unspecified, not intractable, without status migrainosus: Secondary | ICD-10-CM | POA: Diagnosis not present

## 2015-12-27 MED FILL — CITALOPRAM HBR 20 MG TABLET: 20 | 60 days supply | Qty: 60 | Fill #0

## 2016-02-12 DIAGNOSIS — R5383 Other fatigue: Secondary | ICD-10-CM | POA: Diagnosis not present

## 2016-02-17 MED FILL — CITALOPRAM HBR 20 MG TABLET: 20 | 60 days supply | Qty: 60 | Fill #1

## 2016-02-17 MED FILL — CHLORDIAZEPOXIDE-CLIDINIUM: 5-2.5 | 30 days supply | Qty: 60 | Fill #5

## 2016-03-05 DIAGNOSIS — F321 Major depressive disorder, single episode, moderate: Secondary | ICD-10-CM | POA: Diagnosis not present

## 2016-03-13 DIAGNOSIS — F321 Major depressive disorder, single episode, moderate: Secondary | ICD-10-CM | POA: Diagnosis not present

## 2016-03-13 MED FILL — FLUoxetine HCL 20 MG CAPS: 20 | 30 days supply | Qty: 60 | Fill #0

## 2016-03-13 MED FILL — clonazePAM 0.5 MG TABS: 0.5 | 30 days supply | Qty: 30 | Fill #0

## 2016-03-13 MED FILL — PANTOPRAZOLE SOD DR 20 MG T: 20 | 90 days supply | Qty: 90 | Fill #0

## 2016-04-13 ENCOUNTER — Emergency Department (HOSPITAL_BASED_OUTPATIENT_CLINIC_OR_DEPARTMENT_OTHER)
Admission: EM | Admit: 2016-04-13 | Discharge: 2016-04-13 | Disposition: A | Discharge status: Home / Self Care | Payer: 59 | Attending: Emergency Medicine | Admitting: Emergency Medicine

## 2016-04-13 ENCOUNTER — Encounter (HOSPITAL_BASED_OUTPATIENT_CLINIC_OR_DEPARTMENT_OTHER): Payer: Self-pay

## 2016-04-13 DIAGNOSIS — J45909 Unspecified asthma, uncomplicated: Secondary | ICD-10-CM | POA: Diagnosis not present

## 2016-04-13 DIAGNOSIS — R51 Headache: Secondary | ICD-10-CM | POA: Diagnosis present

## 2016-04-13 DIAGNOSIS — Z79899 Other long term (current) drug therapy: Secondary | ICD-10-CM | POA: Diagnosis not present

## 2016-04-13 DIAGNOSIS — G43111 Migraine with aura, intractable, with status migrainosus: Secondary | ICD-10-CM | POA: Diagnosis not present

## 2016-04-13 HISTORY — DX: Migraine, unspecified, not intractable, without status migrainosus: G43.909

## 2016-04-13 MED ORDER — SODIUM CHLORIDE 0.9 % IV BOLUS (SEPSIS)
1000.0000 mL | Freq: Once | INTRAVENOUS | Status: AC
  Administered 2016-04-13: 1000 mL via INTRAVENOUS

## 2016-04-13 MED ORDER — DIPHENHYDRAMINE HCL 50 MG/ML IJ SOLN
25.0000 mg | Freq: Once | INTRAMUSCULAR | Status: AC
  Administered 2016-04-13: 25 mg via INTRAVENOUS
  Filled 2016-04-13: qty 1

## 2016-04-13 MED ORDER — METOCLOPRAMIDE HCL 5 MG/ML IJ SOLN
10.0000 mg | Freq: Once | INTRAMUSCULAR | Status: AC
  Administered 2016-04-13: 10 mg via INTRAVENOUS
  Filled 2016-04-13: qty 2

## 2016-04-13 MED ORDER — DEXAMETHASONE SODIUM PHOSPHATE 10 MG/ML IJ SOLN
10.0000 mg | Freq: Once | INTRAMUSCULAR | Status: AC
  Administered 2016-04-13: 10 mg via INTRAVENOUS
  Filled 2016-04-13: qty 1

## 2016-04-13 NOTE — ED Notes (Signed)
MD at bedside. 

## 2016-04-13 NOTE — ED Triage Notes (Signed)
C/o migraine, blurred vision, n/v x 1-started last night-NAD-steady gait

## 2016-04-13 NOTE — ED Provider Notes (Signed)
[facility] Provider Note   CSN: [id number] Arrival date & time: 04/13/16  1156     History   Chief Complaint Chief Complaint  Patient presents with  . Migraine    HPI [name] PetrinKristy L [name] is a 30 y.o. female.  Patient is a 30 year old female with a history of migraines which she states Imitrex typically takes care of but developed a headache last night which worsened this morning and she is now taken 2 Imitrex without improvement. The headache is in the same location as her typical migraines just more severe. She also has had some blurry vision and nausea vomiting which is also typical of her migraines. She denies any infectious symptoms or unilateral numbness or weakness.   The history is provided by the patient.  Headache   This is a recurrent problem. The current episode started yesterday. The problem occurs constantly. The problem has been gradually worsening. The headache is associated with nothing. The pain is located in the bilateral and occipital region. The quality of the pain is described as throbbing. The pain is at a severity of 9/10. The pain is severe. The pain radiates to the right neck and left neck. Associated symptoms include nausea and vomiting. Pertinent negatives include no fever, no near-syncope and no shortness of breath. She has tried NSAIDs, oral narcotic analgesics and triptan therapy for the symptoms. The treatment provided no relief.    Past Medical History:  Diagnosis Date  . Asthma   . Back pain   . Colitis   . Diverticulitis   . Migraine   . Nerve damage    left femoral nerve    There are no active problems to display for this patient.   Past Surgical History:  Procedure Laterality Date  . CHOLECYSTECTOMY    . 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 KNEE SURGERY     left knee  . WISDOM TOOTH EXTRACTION      OB History    No data available       Home Medications    Prior to Admission medications   Medication  Sig Start Date End Date Taking? Authorizing Provider  ClonazePAM (KLONOPIN PO) Take by mouth.   Yes Historical Provider, MD  FLUoxetine (PROZAC) 20 MG tablet Take 20 mg by mouth daily.   Yes Historical Provider, MD  oxyCODONE-acetaminophen (PERCOCET/ROXICET) 5-325 MG tablet Take by mouth every 4 (four) hours as needed for severe pain.   Yes Historical Provider, MD  SUMAtriptan Succinate (IMITREX PO) Take by mouth.   Yes Historical Provider, MD  acetaminophen (TYLENOL) 500 MG tablet Take 1,000 mg by mouth every 6 (six) hours as needed for moderate pain.    Historical Provider, MD  clidinium-chlordiazePOXIDE (LIBRAX) 5-2.5 MG per capsule Take 1 capsule by mouth 3 (three) times daily before meals. Patient taking differently: Take 1 capsule by mouth 2 (two) times daily.  03/21/14   Gerhard Munchobert Lockwood, MD  Multiple Vitamin (MULTIVITAMIN WITH MINERALS) TABS Take 1 tablet by mouth daily.    Historical Provider, MD  ondansetron (ZOFRAN) 8 MG tablet Take 4 mg by mouth every 8 (eight) hours as needed. For nausea    Historical Provider, MD  pantoprazole (PROTONIX) 20 MG tablet Take 1 tablet (20 mg total) by mouth daily. 05/04/15   [name], PA-C  promethazine (PHENERGAN) 25 MG tablet Take 25 mg by mouth every 6 (six) hours as needed for nausea or vomiting.    Historical Provider,  MD    Family History No family history on file.  Social History Social History  Substance Use Topics  . Smoking status: Never Smoker  . Smokeless tobacco: Never Used  . Alcohol use Yes     Comment: socially     Allergies   Tylenol with codeine #3 [acetaminophen-codeine]   Review of Systems Review of Systems  Constitutional: Negative for fever.  Respiratory: Negative for shortness of breath.   Cardiovascular: Negative for near-syncope.  Gastrointestinal: Positive for nausea and vomiting.  Neurological: Positive for headaches.  All other systems reviewed and are negative.    Physical Exam Updated  Vital Signs BP 150/82 (BP Location: Left Arm)   Pulse 80   Temp 98.1 F (36.7 C) (Oral)   Resp 18   Ht 5' (1.524 m)   Wt 200 lb (90.7 kg)   SpO2 96%   BMI 39.06 kg/m   Physical Exam  Constitutional: She is oriented to person, place, and time. She appears well-developed and well-nourished. No distress.  HENT:  Head: Normocephalic and atraumatic.  Mouth/Throat: Oropharynx is clear and moist.  Eyes: Conjunctivae and EOM are normal. Pupils are equal, round, and reactive to light. Right eye exhibits no discharge. Left eye exhibits no discharge.  photophobia  Neck: Normal range of motion. Neck supple. No spinous process tenderness present. No neck rigidity. No Brudzinski's sign and no Kernig's sign noted.  Cardiovascular: Normal rate, normal heart sounds and intact distal pulses.   No murmur heard. Pulmonary/Chest: Effort normal and breath sounds normal. No respiratory distress. She has no wheezes. She has no rales.  Abdominal: Soft. She exhibits no distension. There is no tenderness.  Musculoskeletal: Normal range of motion. She exhibits no edema or tenderness.  Lymphadenopathy:    She has no cervical adenopathy.  Neurological: She is alert and oriented to person, place, and time. She has normal strength. No cranial nerve deficit or sensory deficit. Coordination and gait normal. GCS eye subscore is 4. GCS verbal subscore is 5. GCS motor subscore is 6.  Skin: Skin is warm and dry.  Psychiatric: She has a normal mood and affect. Her behavior is normal.  Nursing note and vitals reviewed.    ED Treatments / Results  Labs (all labs ordered are listed, but only abnormal results are displayed) Labs Reviewed - No data to display  EKG  EKG Interpretation None       Radiology No results found.  Procedures Procedures (including critical care time)  Medications Ordered in ED Medications - No data to display   Initial Impression / Assessment and Plan / ED Course  I have  reviewed the triage vital signs and the nursing notes.  Pertinent labs & imaging results that were available during my care of the patient were reviewed by me and considered in my medical decision making (see chart for details).  Clinical Course   Pt with typical migraine HA without sx suggestive of SAH(sudden onset, worst of life, or deficits), infection, or cavernous vein thrombosis.  Normal neuro exam and vital signs. Will give HA cocktail and  re-eval.  3:06 PM Pt feeling much better and request d/c.  Final Clinical Impressions(s) / ED Diagnoses   Final diagnoses:  Intractable migraine with aura with status migrainosus    New Prescriptions New Prescriptions   No medications on file     Gwyneth Sprout, MD 04/13/16 1506

## 2016-04-14 DIAGNOSIS — F321 Major depressive disorder, single episode, moderate: Secondary | ICD-10-CM | POA: Diagnosis not present

## 2016-04-14 DIAGNOSIS — G43909 Migraine, unspecified, not intractable, without status migrainosus: Secondary | ICD-10-CM | POA: Diagnosis not present

## 2016-04-14 MED FILL — VENLAFAXINE HCL ER 75 MG CA: 75 | 30 days supply | Qty: 90 | Fill #0

## 2016-05-14 DIAGNOSIS — R635 Abnormal weight gain: Secondary | ICD-10-CM | POA: Diagnosis not present

## 2016-05-14 DIAGNOSIS — E049 Nontoxic goiter, unspecified: Secondary | ICD-10-CM | POA: Diagnosis not present

## 2016-05-14 DIAGNOSIS — R5382 Chronic fatigue, unspecified: Secondary | ICD-10-CM | POA: Diagnosis not present

## 2016-05-14 DIAGNOSIS — F321 Major depressive disorder, single episode, moderate: Secondary | ICD-10-CM | POA: Diagnosis not present

## 2016-05-14 DIAGNOSIS — R0683 Snoring: Secondary | ICD-10-CM | POA: Diagnosis not present

## 2016-05-15 ENCOUNTER — Other Ambulatory Visit: Payer: Self-pay | Admitting: Family Medicine

## 2016-05-15 DIAGNOSIS — E049 Nontoxic goiter, unspecified: Secondary | ICD-10-CM

## 2016-05-18 ENCOUNTER — Encounter (HOSPITAL_BASED_OUTPATIENT_CLINIC_OR_DEPARTMENT_OTHER): Payer: Self-pay | Admitting: Emergency Medicine

## 2016-05-18 ENCOUNTER — Emergency Department (HOSPITAL_BASED_OUTPATIENT_CLINIC_OR_DEPARTMENT_OTHER): Payer: 59

## 2016-05-18 ENCOUNTER — Emergency Department (HOSPITAL_BASED_OUTPATIENT_CLINIC_OR_DEPARTMENT_OTHER)
Admission: EM | Admit: 2016-05-18 | Discharge: 2016-05-18 | Disposition: A | Discharge status: Home / Self Care | Payer: 59 | Attending: Emergency Medicine | Admitting: Emergency Medicine

## 2016-05-18 DIAGNOSIS — N83291 Other ovarian cyst, right side: Secondary | ICD-10-CM | POA: Diagnosis not present

## 2016-05-18 DIAGNOSIS — R1031 Right lower quadrant pain: Secondary | ICD-10-CM | POA: Diagnosis not present

## 2016-05-18 DIAGNOSIS — J45909 Unspecified asthma, uncomplicated: Secondary | ICD-10-CM | POA: Diagnosis not present

## 2016-05-18 DIAGNOSIS — N83201 Unspecified ovarian cyst, right side: Secondary | ICD-10-CM | POA: Diagnosis not present

## 2016-05-18 DIAGNOSIS — K760 Fatty (change of) liver, not elsewhere classified: Secondary | ICD-10-CM | POA: Diagnosis not present

## 2016-05-18 DIAGNOSIS — R112 Nausea with vomiting, unspecified: Secondary | ICD-10-CM | POA: Diagnosis not present

## 2016-05-18 LAB — URINE MICROSCOPIC-ADD ON

## 2016-05-18 LAB — COMPREHENSIVE METABOLIC PANEL
ALT: 26 U/L (ref 14–54)
AST: 26 U/L (ref 15–41)
Albumin: 3.4 g/dL — ABNORMAL LOW (ref 3.5–5.0)
Alkaline Phosphatase: 58 U/L (ref 38–126)
Anion gap: 5 (ref 5–15)
BUN: 6 mg/dL (ref 6–20)
CO2: 26 mmol/L (ref 22–32)
Calcium: 8.1 mg/dL — ABNORMAL LOW (ref 8.9–10.3)
Chloride: 107 mmol/L (ref 101–111)
Creatinine, Ser: 0.63 mg/dL (ref 0.44–1.00)
GFR calc Af Amer: >60 mL/min (ref >60)
GFR calc non Af Amer: >60 mL/min (ref >60)
Glucose, Bld: 90 mg/dL (ref 65–99)
Potassium: 3.3 mmol/L — ABNORMAL LOW (ref 3.5–5.1)
Sodium: 138 mmol/L (ref 135–145)
Total Bilirubin: 0.4 mg/dL (ref 0.3–1.2)
Total Protein: 6.3 g/dL — ABNORMAL LOW (ref 6.5–8.1)

## 2016-05-18 LAB — CBC WITH DIFFERENTIAL/PLATELET
Basophils Absolute: 0 10*3/uL (ref 0.0–0.1)
Basophils Relative: 0 %
Eosinophils Absolute: 0.1 10*3/uL (ref 0.0–0.7)
Eosinophils Relative: 2 %
HCT: 38.1 % (ref 36.0–46.0)
Hemoglobin: 13.2 g/dL (ref 12.0–15.0)
Lymphocytes Relative: 23 %
Lymphs Abs: 1.5 10*3/uL (ref 0.7–4.0)
MCH: 30.1 pg (ref 26.0–34.0)
MCHC: 34.6 g/dL (ref 30.0–36.0)
MCV: 87 fL (ref 78.0–100.0)
Monocytes Absolute: 0.7 10*3/uL (ref 0.1–1.0)
Monocytes Relative: 11 %
Neutro Abs: 4.1 10*3/uL (ref 1.7–7.7)
Neutrophils Relative %: 64 %
Platelets: 217 10*3/uL (ref 150–400)
RBC: 4.38 MIL/uL (ref 3.87–5.11)
RDW: 12.7 % (ref 11.5–15.5)
WBC: 6.4 10*3/uL (ref 4.0–10.5)

## 2016-05-18 LAB — URINALYSIS, ROUTINE W REFLEX MICROSCOPIC
Bilirubin Urine: NEGATIVE
Glucose, UA: NEGATIVE mg/dL
Hgb urine dipstick: NEGATIVE
Ketones, ur: NEGATIVE mg/dL
Nitrite: NEGATIVE
Protein, ur: NEGATIVE mg/dL
Specific Gravity, Urine: 1.008 (ref 1.005–1.030)
pH: 6.5 (ref 5.0–8.0)

## 2016-05-18 LAB — PREGNANCY, URINE: Preg Test, Ur: NEGATIVE

## 2016-05-18 MED ORDER — IOPAMIDOL (ISOVUE-300) INJECTION 61%
100.0000 mL | Freq: Once | INTRAVENOUS | Status: AC | PRN
  Administered 2016-05-18: 100 mL via INTRAVENOUS

## 2016-05-18 MED ORDER — ONDANSETRON HCL 4 MG/2ML IJ SOLN
4.0000 mg | Freq: Once | INTRAMUSCULAR | Status: AC
  Administered 2016-05-18: 4 mg via INTRAVENOUS
  Filled 2016-05-18: qty 2

## 2016-05-18 MED ORDER — METOCLOPRAMIDE HCL 5 MG/ML IJ SOLN
10.0000 mg | Freq: Once | INTRAMUSCULAR | Status: AC
  Administered 2016-05-18: 10 mg via INTRAVENOUS
  Filled 2016-05-18: qty 2

## 2016-05-18 MED ORDER — NAPROXEN 500 MG PO TABS: 500.0000 mg | ORAL_TABLET | Freq: Two times a day (BID) | ORAL | 0 refills | Status: DC

## 2016-05-18 MED ORDER — HYDROCODONE-ACETAMINOPHEN 5-325 MG PO TABS: 1.0000 | ORAL_TABLET | ORAL | 0 refills | Status: AC | PRN

## 2016-05-18 MED ORDER — MORPHINE SULFATE (PF) 4 MG/ML IV SOLN
4.0000 mg | Freq: Once | INTRAVENOUS | Status: AC
  Administered 2016-05-18: 4 mg via INTRAVENOUS
  Filled 2016-05-18: qty 1

## 2016-05-18 MED ORDER — METOCLOPRAMIDE HCL 10 MG PO TABS: 10.0000 mg | ORAL_TABLET | Freq: Four times a day (QID) | ORAL | 0 refills | Status: DC | PRN

## 2016-05-18 MED FILL — NAPROXEN 500 MG TABLET: 500 | 15 days supply | Qty: 30 | Fill #0

## 2016-05-18 MED FILL — METOCLOPRAMIDE 10 MG TABLET: 10 | 2 days supply | Qty: 10 | Fill #0

## 2016-05-18 MED FILL — HYDROCODON-APAP 5-325: 5-325 | 2 days supply | Qty: 12 | Fill #0

## 2016-05-18 NOTE — Discharge Instructions (Signed)
Your CT today showed a right ovarian cyst. This is likely the cause of your symptoms. Please take Naprosyn twice daily for pain. If needed, you may take Norco every 4-6 hours as needed for severe pain. Take Reglan every 6 hours for nausea and vomiting. Follow-up with your gynecologist. Return to the emergency department person and worsening pain, inability to keep foods or fluids down, or any new or worsening symptoms.

## 2016-05-18 NOTE — ED Notes (Signed)
Pt unable to give UA at this time 

## 2016-05-18 NOTE — ED Notes (Signed)
Pt given ginger ale for a PO challenge at this time

## 2016-05-18 NOTE — ED Provider Notes (Signed)
[facility] Provider Note   CSN: [id number] Arrival date & time: 05/18/16  1011     History   Chief Complaint Chief Complaint  Patient presents with  . Abdominal Pain    HPI [name] [name] is a 30 y.o. female.  HPI [name] [name] is a 30 y.o. female with PMH significant for colitis, diverticulitis, migraine, asthma, and back pain who presents with 4 days of sudden onset, waxing and waning, moderate, aching RLQ abdominal pain with associated fever, chills, N/V/D.  No bloody stools.  Has been taking Tylenol with some relief.  Has tried phenergan, but states she has been unable to keep this down.  She reports decreased PO intake.  Denies cough, CP, SOB, urinary symptoms, or vaginal symptoms.  Prior abdominal surgeries include cholecystectomy.  Past Medical History:  Diagnosis Date  . Asthma   . Back pain   . Colitis   . Diverticulitis   . Migraine   . Nerve damage    left femoral nerve    There are no active problems to display for this patient.   Past Surgical History:  Procedure Laterality Date  . CHOLECYSTECTOMY    . 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 KNEE SURGERY     left knee  . WISDOM TOOTH EXTRACTION      OB History    No data available       Home Medications    Prior to Admission medications   Medication Sig Start Date End Date Taking? Authorizing Provider  acetaminophen (TYLENOL) 500 MG tablet Take 1,000 mg by mouth every 6 (six) hours as needed for moderate pain.    Historical Provider, MD  clidinium-chlordiazePOXIDE (LIBRAX) 5-2.5 MG per capsule Take 1 capsule by mouth 3 (three) times daily before meals. Patient taking differently: Take 1 capsule by mouth 2 (two) times daily.  03/21/14   [name], MD  ClonazePAM (KLONOPIN PO) Take by mouth.    Historical Provider, MD  FLUoxetine (PROZAC) 20 MG tablet Take 20 mg by mouth daily.    Historical Provider, MD  HYDROcodone-acetaminophen (NORCO/VICODIN) 5-325 MG tablet  Take 1 tablet by mouth every 4 (four) hours as needed. 05/18/16 05/20/16  Cheri Fowler, PA-C  metoCLOPramide (REGLAN) 10 MG tablet Take 1 tablet (10 mg total) by mouth every 6 (six) hours as needed for nausea or vomiting. 05/18/16   Cheri Fowler, PA-C  Multiple Vitamin (MULTIVITAMIN WITH MINERALS) TABS Take 1 tablet by mouth daily.    Historical Provider, MD  naproxen (NAPROSYN) 500 MG tablet Take 1 tablet (500 mg total) by mouth 2 (two) times daily. 05/18/16   Cheri Fowler, PA-C  oxyCODONE-acetaminophen (PERCOCET/ROXICET) 5-325 MG tablet Take by mouth every 4 (four) hours as needed for severe pain.    Historical Provider, MD  pantoprazole (PROTONIX) 20 MG tablet Take 1 tablet (20 mg total) by mouth daily. 05/04/15   Barrett Henle, PA-C  promethazine (PHENERGAN) 25 MG tablet Take 25 mg by mouth every 6 (six) hours as needed for nausea or vomiting.    Historical Provider, MD  SUMAtriptan Succinate (IMITREX PO) Take by mouth.    Historical Provider, MD    Family History History reviewed. No pertinent family history.  Social History Social History  Substance Use Topics  . Smoking status: Never Smoker  . Smokeless tobacco: Never Used  . Alcohol use Yes     Comment: socially     Allergies   Tylenol with codeine #  3 [acetaminophen-codeine]   Review of Systems Review of Systems All other systems negative unless otherwise stated in HPI   Physical Exam Updated Vital Signs BP 129/70   Pulse 72   Temp 98.3 F (36.8 C) (Oral)   Resp 17   Ht 5' (1.524 m)   Wt 90.7 kg   SpO2 100%   BMI 39.06 kg/m   Physical Exam  Constitutional: She is oriented to person, place, and time. She appears well-developed and well-nourished.  Non-toxic appearance. She does not have a sickly appearance. She does not appear ill.  HENT:  Head: Normocephalic and atraumatic.  Mouth/Throat: Oropharynx is clear and moist.  Eyes: Conjunctivae are normal.  Neck: Normal range of motion. Neck supple.    Cardiovascular: Normal rate and regular rhythm.   Pulmonary/Chest: Effort normal and breath sounds normal. No accessory muscle usage or stridor. No respiratory distress. She has no wheezes. She has no rhonchi. She has no rales.  Abdominal: Soft. Bowel sounds are normal. She exhibits no distension. There is tenderness in the right lower quadrant. There is positive Murphy's sign. There is no rebound and no guarding.  Musculoskeletal: Normal range of motion.  Lymphadenopathy:    She has no cervical adenopathy.  Neurological: She is alert and oriented to person, place, and time.  Speech clear without dysarthria.  Skin: Skin is warm and dry.  Psychiatric: She has a normal mood and affect. Her behavior is normal.     ED Treatments / Results  Labs (all labs ordered are listed, but only abnormal results are displayed) Labs Reviewed  URINALYSIS, ROUTINE W REFLEX MICROSCOPIC (NOT AT [facility]) - Abnormal; Notable for the following:       Result Value   Leukocytes, UA TRACE (*)    All other components within normal limits  COMPREHENSIVE METABOLIC PANEL - Abnormal; Notable for the following:    Potassium 3.3 (*)    Calcium 8.1 (*)    Total Protein 6.3 (*)    Albumin 3.4 (*)    All other components within normal limits  URINE MICROSCOPIC-ADD ON - Abnormal; Notable for the following:    Squamous Epithelial / LPF 0-5 (*)    Bacteria, UA FEW (*)    All other components within normal limits  PREGNANCY, URINE  CBC WITH DIFFERENTIAL/PLATELET    EKG  EKG Interpretation None       Radiology Ct Abdomen Pelvis W Contrast  Result Date: 05/18/2016 CLINICAL DATA:  Right lower quadrant abdominal pain. EXAM: CT ABDOMEN AND PELVIS WITH CONTRAST TECHNIQUE: Multidetector CT imaging of the abdomen and pelvis was performed using the standard protocol following bolus administration of intravenous contrast. CONTRAST:  100mL ISOVUE-300 IOPAMIDOL (ISOVUE-300) INJECTION 61% COMPARISON:  CT abdomen and pelvis  05/08/2014. FINDINGS: Lower chest: The lung bases are clear without focal nodule, mass, or airspace disease. Heart size is normal. No significant pleural or pericardial effusion is present. Hepatobiliary: There is mild diffuse fatty infiltration of the liver. No discrete lesions are present. The common bile duct is within normal limits following cholecystectomy. Liver contour is normal. Pancreas: No significant inflammatory changes are present. There is no solid or cystic mass lesion. No ductal dilation is present. Spleen: Normal in size without focal abnormality. Adrenals/Urinary Tract: The adrenal glands are normal bilaterally. The kidneys are unremarkable. No significant stone or mass lesion is present. The ureters are within normal limits bilaterally. The urinary bladder is within normal limits. Stomach/Bowel: The stomach and duodenum are within normal limits. Scattered small nodes  are present within the small bowel mesenteries. There is no focal inflammation of the bowel. No obstruction or free air is present. The appendix is visualized and normal. The ascending and transverse colon are within normal limits. The descending colon is within normal limits. Diverticular present within the sigmoid colon without focal inflammation. Vascular/Lymphatic: No significant vascular calcifications are present. No significant retroperitoneal adenopathy is present. Sub cm mesenteric nodes are noted. Small nodes are present in the inguinal regions bilaterally. Reproductive: IUD is in place. A 3.5 x 4.7 cm water density lesion in the right adnexa is compatible with a cyst. The left adnexa is unremarkable. Other: No significant free fluid or free air is present. Musculoskeletal: Bone windows are unremarkable. IMPRESSION: 1. 4.7 cm right adnexal cyst may be related to the patient's pain. 2. Normal CT appearance of the appendix. 3. No other focal or acute lesion to explain the patient's symptoms. 4. Scattered sub cm mesenteric  nodes could represent a low-grade enteritis. 5. Hepatic steatosis. Electronically Signed   By: Marin Robertshristopher  Mattern M.D.   On: 05/18/2016 14:20    Procedures Procedures (including critical care time)  Medications Ordered in ED Medications  ondansetron (ZOFRAN) injection 4 mg (4 mg Intravenous Given 05/18/16 1054)  morphine 4 MG/ML injection 4 mg (4 mg Intravenous Given 05/18/16 1054)  metoCLOPramide (REGLAN) injection 10 mg (10 mg Intravenous Given 05/18/16 1146)  ondansetron (ZOFRAN) injection 4 mg (4 mg Intravenous Given 05/18/16 1340)  iopamidol (ISOVUE-300) 61 % injection 100 mL (100 mLs Intravenous Contrast Given 05/18/16 1354)     Initial Impression / Assessment and Plan / ED Course  I have reviewed the triage vital signs and the nursing notes.  Pertinent labs & imaging results that were available during my care of the patient were reviewed by me and considered in my medical decision making (see chart for details).  Clinical Course    Patient presents with 4 days of RLQ abdominal pain with fever and N/V/D.  Hx of colitis and diverticulitis.  VSS, NAD.  Patient appears non-toxic or ill.  Abdomen soft with tenderness in RLQ without rebound, guarding, or rigidity.  Will obtain labs and provide symptom relief.  Concern for appendicits vs colitis vs colitis with abscess, will obtain CT for further evaluation.  CT remarkable for 4.7 cm right adnexal cyst. Normal appendix. She is not pregnant. Labs largely without acute abnormalities. Patient able to tolerate by mouth without difficulty. Pain controlled. Patient has gynecologist she will follow up with. Return precautions discussed. Stable for discharge.  Case has been discussed with and seen by Dr. Patria Maneampos who agrees with the above plan for discharge.  Final Clinical Impressions(s) / ED Diagnoses   Final diagnoses:  Right ovarian cyst  Non-intractable vomiting with nausea, unspecified vomiting type    New Prescriptions Discharge  Medication List as of 05/18/2016  2:57 PM    START taking these medications   Details  HYDROcodone-acetaminophen (NORCO/VICODIN) 5-325 MG tablet Take 1 tablet by mouth every 4 (four) hours as needed., Starting Mon 05/18/2016, Until Wed 05/20/2016, Print    metoCLOPramide (REGLAN) 10 MG tablet Take 1 tablet (10 mg total) by mouth every 6 (six) hours as needed for nausea or vomiting., Starting Mon 05/18/2016, Print    naproxen (NAPROSYN) 500 MG tablet Take 1 tablet (500 mg total) by mouth 2 (two) times daily., Starting Mon 05/18/2016, Print         Cheri FowlerKayla Dannie Woolen, PA-C 05/18/16 1656    Azalia BilisKevin Campos, MD 05/19/16 [phone]

## 2016-05-18 NOTE — ED Triage Notes (Signed)
Pt states abd pain x 4 days with n/v/d and fever.  Last took tylenol 2 hrs ago.

## 2016-05-21 ENCOUNTER — Ambulatory Visit
Admission: RE | Admit: 2016-05-21 | Discharge: 2016-05-21 | Disposition: A | Discharge status: Home / Self Care | Payer: 59 | Source: Ambulatory Visit | Attending: Family Medicine | Admitting: Family Medicine

## 2016-05-21 DIAGNOSIS — E049 Nontoxic goiter, unspecified: Secondary | ICD-10-CM | POA: Diagnosis not present

## 2016-05-22 DIAGNOSIS — N83201 Unspecified ovarian cyst, right side: Secondary | ICD-10-CM | POA: Diagnosis not present

## 2016-05-22 DIAGNOSIS — R102 Pelvic and perineal pain: Secondary | ICD-10-CM | POA: Diagnosis not present

## 2016-05-28 MED FILL — NAPROXEN 500 MG TABLET: 500 | 22 days supply | Qty: 45 | Fill #0

## 2016-05-28 MED FILL — PROMETHAZINE 12.5 MG TABLET: 12.5 | 6 days supply | Qty: 45 | Fill #0

## 2016-05-28 MED FILL — CYCLOBENZAPRINE 10 MG TAB: 10 | 15 days supply | Qty: 45 | Fill #0

## 2016-06-15 DIAGNOSIS — R1031 Right lower quadrant pain: Secondary | ICD-10-CM | POA: Diagnosis not present

## 2016-09-06 ENCOUNTER — Telehealth: Payer: 59 | Admitting: Family

## 2016-09-06 DIAGNOSIS — J029 Acute pharyngitis, unspecified: Secondary | ICD-10-CM | POA: Diagnosis not present

## 2016-09-06 MED ORDER — BENZONATATE 100 MG PO CAPS: 100.0000 mg | ORAL_CAPSULE | Freq: Three times a day (TID) | ORAL | 0 refills | Status: DC | PRN

## 2016-09-06 MED ORDER — PREDNISONE 5 MG PO TABS: 5.0000 mg | ORAL_TABLET | ORAL | 0 refills | Status: DC

## 2016-09-06 NOTE — Progress Notes (Signed)
We are sorry that you are not feeling well.  Here is how we plan to help!  Based on what you have shared with me it looks like you have upper respiratory tract inflammation that has resulted in a significant cough.  Inflammation and infection in the upper respiratory tract is commonly called bronchitis and has four common causes:  Allergies, Viral Infections, Acid Reflux and Bacterial Infections.  Allergies, viruses and acid reflux are treated by controlling symptoms or eliminating the cause. An example might be a cough caused by taking certain blood pressure medications. You stop the cough by changing the medication. Another example might be a cough caused by acid reflux. Controlling the reflux helps control the cough.  Based on your presentation I believe you most likely have A cough due to a virus.  This is called viral bronchitis and is best treated by rest, plenty of fluids and control of the cough.  You may use Ibuprofen or Tylenol as directed to help your symptoms.     In addition you may use A non-prescription cough medication called Mucinex DM: take 2 tablets every 12 hours. and A prescription cough medication called Tessalon Perles 100mg. You may take 1-2 capsules every 8 hours as needed for your cough.  Sterapred 5 mg dosepak  USE OF BRONCHODILATOR ("RESCUE") INHALERS: There is a risk from using your bronchodilator too frequently.  The risk is that over-reliance on a medication which only relaxes the muscles surrounding the breathing tubes can reduce the effectiveness of medications prescribed to reduce swelling and congestion of the tubes themselves.  Although you feel brief relief from the bronchodilator inhaler, your asthma may actually be worsening with the tubes becoming more swollen and filled with mucus.  This can delay other crucial treatments, such as oral steroid medications. If you need to use a bronchodilator inhaler daily, several times per day, you should discuss this with your  provider.  There are probably better treatments that could be used to keep your asthma under control.     HOME CARE . Only take medications as instructed by your medical team. . Complete the entire course of an antibiotic. . Drink plenty of fluids and get plenty of rest. . Avoid close contacts especially the very young and the elderly . Cover your mouth if you cough or cough into your sleeve. . Always remember to wash your hands . A steam or ultrasonic humidifier can help congestion.   GET HELP RIGHT AWAY IF: . You develop worsening fever. . You become short of breath . You cough up blood. . Your symptoms persist after you have completed your treatment plan MAKE SURE YOU   Understand these instructions.  Will watch your condition.  Will get help right away if you are not doing well or get worse.  Your e-visit answers were reviewed by a board certified advanced clinical practitioner to complete your personal care plan.  Depending on the condition, your plan could have included both over the counter or prescription medications. If there is a problem please reply  once you have received a response from your provider. Your safety is important to us.  If you have drug allergies check your prescription carefully.    You can use MyChart to ask questions about today's visit, request a non-urgent call back, or ask for a work or school excuse for 24 hours related to this e-Visit. If it has been greater than 24 hours you will need to follow up with your provider,   or enter a new e-Visit to address those concerns. You will get an e-mail in the next two days asking about your experience.  I hope that your e-visit has been valuable and will speed your recovery. Thank you for using e-visits.   

## 2016-09-08 DIAGNOSIS — R6889 Other general symptoms and signs: Secondary | ICD-10-CM | POA: Diagnosis not present

## 2016-09-08 DIAGNOSIS — J45901 Unspecified asthma with (acute) exacerbation: Secondary | ICD-10-CM | POA: Diagnosis not present

## 2016-10-19 DIAGNOSIS — G43909 Migraine, unspecified, not intractable, without status migrainosus: Secondary | ICD-10-CM | POA: Diagnosis not present

## 2016-10-29 ENCOUNTER — Other Ambulatory Visit: Payer: Self-pay | Admitting: Gastroenterology

## 2016-10-29 DIAGNOSIS — R1032 Left lower quadrant pain: Secondary | ICD-10-CM | POA: Diagnosis not present

## 2016-10-29 MED FILL — SULFAMETHOXAZOLE/TMP DS TAB: 800-160 | 10 days supply | Qty: 20 | Fill #0

## 2016-10-29 MED FILL — traMADol HCL 50 MG TABS: 50 | 9 days supply | Qty: 35 | Fill #0

## 2016-10-30 ENCOUNTER — Ambulatory Visit
Admission: RE | Admit: 2016-10-30 | Discharge: 2016-10-30 | Disposition: A | Discharge status: Home / Self Care | Payer: 59 | Source: Ambulatory Visit | Attending: Gastroenterology | Admitting: Gastroenterology

## 2016-10-30 DIAGNOSIS — K5730 Diverticulosis of large intestine without perforation or abscess without bleeding: Secondary | ICD-10-CM | POA: Diagnosis not present

## 2016-10-30 DIAGNOSIS — R1032 Left lower quadrant pain: Secondary | ICD-10-CM

## 2016-11-04 DIAGNOSIS — K5792 Diverticulitis of intestine, part unspecified, without perforation or abscess without bleeding: Secondary | ICD-10-CM | POA: Diagnosis not present

## 2016-11-04 DIAGNOSIS — K5732 Diverticulitis of large intestine without perforation or abscess without bleeding: Secondary | ICD-10-CM | POA: Diagnosis not present

## 2016-11-04 DIAGNOSIS — R110 Nausea: Secondary | ICD-10-CM | POA: Diagnosis not present

## 2016-11-23 ENCOUNTER — Other Ambulatory Visit: Payer: Self-pay | Admitting: General Surgery

## 2016-11-23 DIAGNOSIS — K5732 Diverticulitis of large intestine without perforation or abscess without bleeding: Secondary | ICD-10-CM | POA: Diagnosis not present

## 2016-11-23 MED FILL — metroNIDAZOLE 500 MG TABS: 500 | 1 days supply | Qty: 6 | Fill #0

## 2016-11-23 MED FILL — NEOMYCIN 500 MG TABLET: 500 | 1 days supply | Qty: 6 | Fill #0

## 2016-11-23 NOTE — H&P (Signed)
History of Present Illness [name] [name]; 11/23/2016 4:11 PM) The patient is a 31 year old female who presents with diverticulitis. Referred to Urgent by Dr. Vilinda Boehringer for a surgical opinion regarding recurring bouts of diverticulitis.  The patient was previously seen by Drs. Derrell Lolling and Kaige Whistler in 2016.  She states that she started having problems in July 2013. She has intermittent problems with abdominal pain, left lower quadrant pain, cramps, nausea vomiting and diarrhea. She has some daily irritable bowel syndrome symptoms with 3-4 soft bowel movements each day. She has episodes of sharp left lower quadrant pain about every 2 months. She's been treated with antibiotics. She's had multiple CT scans over the past 3 or 4 years. She has seen some blood.  CT scan in 8/14 showed colitis involving the distal descending colon and sigmoid colon. No abscess noted CT scan 10/15 showed some prominence and mild thickening of the distal descending and sigmoid colon. CT scan in April 2018 showed recurrent diverticulitis  She does did have a colonoscopy by Dr. Randa Evens in November 2015. Only diverticulosis in the rectosigmoid was noted. No inflammation. Biopsy showed benign colorectal mucosa. Benign lymphoid aggregates. No evidence of microscopic colitis or dysplasia. She apparently had a colonoscopy several years ago by Dr. Kinnie Scales and states that didn't show much. She has a family history of diverticulosis in the father and ulcerative colitis in 3 of her father's sisters.  She had approximately one year of minimal symptoms, but began having more severe LLQ pain last week. She was started on Bactrim that was not helpful. She was switched over to Cipro/Flagyl which has been helping some. She has nausea related to the Flagyl. Repeat CT scan on 10/30/16 showed proximal sigmoid colon thickening with surrounding inflammatory change.     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 Oral Surgery Gallbladder Surgery - Laparoscopic  Diagnostic Studies History [name] Levee, [name]; 11/23/2016 2:25 PM) Colonoscopy within last year Mammogram never Pap Smear 1-5 years ago  Allergies Michel Bickers, LPN; 1/61/0960 4:54 PM) Tylenol *ANALGESICS - NonNarcotic*  Medication History [name] Levee, [name]; 11/23/2016 2:25 PM) Oxycodone-Acetaminophen (5-325MG  Tablet, Oral) Active. Pantoprazole Sodium (20MG  Tablet DR, Oral) Active. SUMAtriptan Succinate (100MG  Tablet, Oral) Active. Rizatriptan Benzoate (10MG  Tablet Disint, Oral) Active. Propranolol HCl ER (60MG  Capsule ER 24HR, Oral) Active. Ciprofloxacin HCl (500MG  Tablet, Oral) Active. MetroNIDAZOLE (500MG  Tablet, Oral) Active. Promethazine HCl (12.5MG  Tablet, Oral) Active. Medications Reconciled Neomycin Sulfate (500MG  Tablet, 2 (two) Tablet Oral SEE NOTE, Taken starting 11/23/2016) Active. (TAKE TWO TABLETS AT 2 PM, 3 PM, AND 10 PM THE DAY PRIOR TO SURGERY) Flagyl (500MG  Tablet, 2 (two) Tablet Oral SEE NOTE, Taken starting 11/23/2016) Active. (Take at 2pm, 3pm, and 10pm the day prior to your colon operation)  Social History [name] Levee, [name]; 11/23/2016 2:25 PM) Caffeine use Carbonated beverages, Tea. No drug use Tobacco use Never smoker. Alcohol use Occasional alcohol use.  Family History [name] Levee, [name]; 11/23/2016 2:25 PM) Colon Polyps Father. Hypertension Mother. Respiratory Condition Father.  Pregnancy / Birth History [name] Levee, [name]; 11/23/2016 2:25 PM) Contraceptive History Oral contraceptives. Gravida 0 Para 0 Age at menarche 11 years. Regular  periods  Other Problems [name] Levee, [name]; 11/23/2016 2:25 PM) Asthma Diverticulosis Gastroesophageal Reflux Disease Arthritis Anxiety Disorder COLITIS (K52.9)  Review of Systems [name] [name]([name] [name]; 11/23/2016 2:25 PM) General Present- Fatigue. Not Present- Appetite Loss, Chills, Fever, Night Sweats, Weight Gain and Weight Loss. Skin Not Present- Change in Wart/Mole, Dryness, Hives, Jaundice, New Lesions, Non-Healing Wounds, Rash and Ulcer. HEENT Present- Seasonal Allergies. Not Present- Earache, Hearing Loss, Hoarseness, Nose Bleed, Oral Ulcers, Ringing in the Ears, Sinus Pain, Sore Throat, Visual Disturbances, Wears glasses/contact lenses and Yellow Eyes. Respiratory Not Present- Bloody sputum, Chronic Cough, Difficulty Breathing, Snoring and Wheezing. Breast Not Present- Breast Mass, Breast Pain, Nipple Discharge and Skin Changes. Cardiovascular Not Present- Chest Pain, Difficulty Breathing Lying Down, Leg Cramps, Palpitations, Rapid Heart Rate, Shortness of Breath and Swelling of Extremities. Gastrointestinal Present- Abdominal Pain, Bloating, Bloody Stool, Excessive gas, Nausea and Vomiting. Not Present- Change in Bowel Habits, Chronic diarrhea, Constipation, Difficulty Swallowing, Gets full quickly at meals, Hemorrhoids, Indigestion and Rectal Pain. Female Genitourinary Not Present- Frequency, Nocturia, Painful Urination, Pelvic Pain and Urgency. Musculoskeletal Present- Joint Pain. Not Present- Back Pain, Joint Stiffness, Muscle Pain, Muscle Weakness and Swelling of Extremities. Neurological Not Present- Decreased Memory, Fainting, Headaches, Numbness, Seizures, Tingling, Tremor, Trouble walking and Weakness. Psychiatric Not Present- Anxiety, Bipolar, Change in Sleep Pattern, Depression, Fearful and Frequent crying. Endocrine Not Present- Cold Intolerance, Excessive Hunger, Hair Changes, Heat Intolerance, Hot flashes and New Diabetes. Hematology Not Present- Easy Bruising,  Excessive bleeding, Gland problems, HIV and Persistent Infections.  Vitals Tresa Endo(Kelly Dockery LPN; 5/40/98115/14/2018 9:141:52 PM) 11/23/2016 1:51 PM Weight: 120.6 lb Height: 60in Body Surface Area: 1.51 m Body Mass Index: 23.55 kg/m  Temp.: 98.63F(Oral)  Pulse: 89 (Regular)  BP: 120/68 (Sitting, Left Arm, Standard)      Physical Exam [name] [name]([name] [name]; 11/23/2016 4:12 PM)  General Mental Status-Alert. General Appearance-Consistent with stated age. Hydration-Well hydrated. Voice-Normal.  Head and Neck Head-normocephalic, atraumatic with no lesions or palpable masses. Trachea-midline.  Eye Eyeball - Bilateral-Extraocular movements intact. Sclera/Conjunctiva - Bilateral-No scleral icterus.  Chest and Lung Exam Chest and lung exam reveals -quiet, even and easy respiratory effort with no use of accessory muscles and on auscultation, normal breath sounds, no adventitious sounds and normal vocal resonance. Inspection Chest Wall - Normal. Back - normal.  Cardiovascular Cardiovascular examination reveals -normal heart sounds, regular rate and rhythm with no murmurs and no digital clubbing, cyanosis, edema, increased warmth or tenderness.  Abdomen Inspection Inspection of the abdomen reveals - No Hernias. Skin - Scar - no surgical scars. Palpation/Percussion Palpation and Percussion of the abdomen reveal - Soft, Non Tender, No Rebound tenderness, No Rigidity (guarding) and No hepatosplenomegaly. Note: Abdomen reveals significant obesity. Really very soft and not distended. No objective tenderness. No mass. No hernia.  Neurologic Neurologic evaluation reveals -alert and oriented x 3 with no impairment of recent or remote memory. Mental Status-Normal.  Musculoskeletal Normal Exam - Left-Upper Extremity Strength Normal and Lower Extremity Strength Normal. Normal Exam - Right-Upper Extremity Strength Normal and Lower Extremity Strength  Normal.    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 She has had 3-4 major episodes over the last 5-6 years. She has not been hospitalized and it does resolve with medical treatment. We discussed the risk and benefits of surgical resection in detail. We discussed that surgery may not improve her symptoms and she may continue to have diarrhea and abdominal pain afterwards. I think it is reasonable to try to remove the sigmoid if it is causing major  changes to her quality of life. The surgery and anatomy were described to the patient as well as the risks of surgery and the possible complications. These include: Bleeding, deep abdominal infections and possible wound complications such as hernia and infection, damage to adjacent structures, leak of surgical connections, which can lead to other surgeries and possibly an ostomy, possible need for other procedures, such as abscess drains in radiology, possible prolonged hospital stay, possible diarrhea from removal of part of the colon, possible constipation from narcotics, possible bowel, bladder or sexual dysfunction if having rectal surgery, prolonged fatigue/weakness or appetite loss, possible early recurrence of of disease, possible complications of their medical problems such as heart disease or arrhythmias or lung problems, death (less than 1%). I believe the patient understands and wishes to proceed with the surgery.

## 2016-12-23 DIAGNOSIS — R109 Unspecified abdominal pain: Secondary | ICD-10-CM | POA: Diagnosis not present

## 2016-12-23 DIAGNOSIS — K5792 Diverticulitis of intestine, part unspecified, without perforation or abscess without bleeding: Secondary | ICD-10-CM | POA: Diagnosis not present

## 2016-12-30 MED FILL — PANTOPRAZOLE SOD DR 20 MG T: 20 | 30 days supply | Qty: 30 | Fill #0

## 2017-01-04 DIAGNOSIS — K5732 Diverticulitis of large intestine without perforation or abscess without bleeding: Secondary | ICD-10-CM | POA: Diagnosis not present

## 2017-01-28 MED FILL — PROPRANOLOL ER 60 MG CAP: 60 | 30 days supply | Qty: 30 | Fill #0

## 2017-01-28 MED FILL — PANTOPRAZOLE SOD DR 20 MG T: 20 | 30 days supply | Qty: 30 | Fill #1

## 2017-01-29 ENCOUNTER — Other Ambulatory Visit: Payer: Self-pay | Admitting: *Deleted

## 2017-01-29 NOTE — Patient Instructions (Addendum)
[name] [name]  01/29/2017   Your procedure is scheduled on: 02/10/2017    Report to [facility] Main  Entrance Take [place]  elevators to 3rd floor to  Short Stay Center at   0630 AM.     Call this number if you have problems the morning of surgery [phone]    Remember: ONLY 1 PERSON MAY GO WITH YOU TO SHORT STAY TO GET  READY MORNING OF YOUR SURGERY.  Do not eat food or drink liquids :After Midnight.             Follow Bowel Prep Instructions per MD.              Clear liquid diet on the day of Bowel Prep.      Take these medicines the morning of surgery with A SIP OF WATER: Inhalers as usual and bring, Protonix, Propanolol ( Inderal)                                 You may not have any metal on your body including hair pins and              piercings  Do not wear jewelry, make-up, lotions, powders or perfumes, deodorant             Do not wear nail polish.  Do not shave  48 hours prior to surgery.               Do not bring valuables to the hospital. [facility] IS NOT             RESPONSIBLE   FOR VALUABLES.  Contacts, dentures or bridgework may not be worn into surgery.  Leave suitcase in the car. After surgery it may be brought to your room.          CLEAR LIQUID DIET   Foods Allowed                                                                     Foods Excluded  Coffee and tea, regular and decaf                             liquids that you cannot  Plain Jell-O in any flavor                                             see through such as: Fruit ices (not with fruit pulp)                                     milk, soups, orange juice  Iced Popsicles                                    All solid food  Carbonated beverages, regular and diet                                    Cranberry, grape and apple juices Sports drinks like Gatorade Lightly seasoned clear broth or consume(fat free) Sugar, honey syrup  Sample Menu Breakfast                                 Lunch                                     Supper Cranberry juice                    Beef broth                            Chicken broth Jell-O                                     Grape juice                           Apple juice Coffee or tea                        Jell-O                                      Popsicle                                                Coffee or tea                        Coffee or tea  _____________________________________________________________________                Please read over the following fact sheets you were given: _____________________________________________________________________             [facility] - Preparing for Surgery Before surgery, you can play an important role.  Because skin is not sterile, your skin needs to be as free of germs as possible.  You can reduce the number of germs on your skin by washing with CHG (chlorahexidine gluconate) soap before surgery.  CHG is an antiseptic cleaner which kills germs and bonds with the skin to continue killing germs even after washing. Please DO NOT use if you have an allergy to CHG or antibacterial soaps.  If your skin becomes reddened/irritated stop using the CHG and inform your nurse when you arrive at Short Stay. Do not shave (including legs and underarms) for at least 48 hours prior to the first CHG shower.  You may shave your face/neck. Please follow these instructions carefully:  1.  Shower with CHG Soap the night before surgery and the  morning of Surgery.  2.  If you choose to wash your hair, wash your hair first as  usual with your  normal  shampoo.  3.  After you shampoo, rinse your hair and body thoroughly to remove the  shampoo.                           4.  Use CHG as you would any other liquid soap.  You can apply chg directly  to the skin and wash                       Gently with a scrungie or clean washcloth.  5.  Apply the CHG Soap to your body ONLY FROM THE NECK  DOWN.   Do not use on face/ open                           Wound or open sores. Avoid contact with eyes, ears mouth and genitals (private parts).                       Wash face,  Genitals (private parts) with your normal soap.             6.  Wash thoroughly, paying special attention to the area where your surgery  will be performed.  7.  Thoroughly rinse your body with warm water from the neck down.  8.  DO NOT shower/wash with your normal soap after using and rinsing off  the CHG Soap.                9.  Pat yourself dry with a clean towel.            10.  Wear clean pajamas.            11.  Place clean sheets on your bed the night of your first shower and do not  sleep with pets. Day of Surgery : Do not apply any lotions/deodorants the morning of surgery.  Please wear clean clothes to the hospital/surgery center.  FAILURE TO FOLLOW THESE INSTRUCTIONS MAY RESULT IN THE CANCELLATION OF YOUR SURGERY PATIENT SIGNATURE_________________________________  NURSE SIGNATURE__________________________________  ________________________________________________________________________  WHAT IS A BLOOD TRANSFUSION? Blood Transfusion Information  A transfusion is the replacement of blood or some of its parts. Blood is made up of multiple cells which provide different functions.  Red blood cells carry oxygen and are used for blood loss replacement.  White blood cells fight against infection.  Platelets control bleeding.  Plasma helps clot blood.  Other blood products are available for specialized needs, such as hemophilia or other clotting disorders. BEFORE THE TRANSFUSION  Who gives blood for transfusions?   Healthy volunteers who are fully evaluated to make sure their blood is safe. This is blood bank blood. Transfusion therapy is the safest it has ever been in the practice of medicine. Before blood is taken from a donor, a complete history is taken to make sure that person has no history of  diseases nor engages in risky social behavior (examples are intravenous drug use or sexual activity with multiple partners). The donor's travel history is screened to minimize risk of transmitting infections, such as malaria. The donated blood is tested for signs of infectious diseases, such as HIV and hepatitis. The blood is then tested to be sure it is compatible with you in order to minimize the chance of a transfusion reaction. If you or a relative donates blood, this is often done  in anticipation of surgery and is not appropriate for emergency situations. It takes many days to process the donated blood. RISKS AND COMPLICATIONS Although transfusion therapy is very safe and saves many lives, the main dangers of transfusion include:   Getting an infectious disease.  Developing a transfusion reaction. This is an allergic reaction to something in the blood you were given. Every precaution is taken to prevent this. The decision to have a blood transfusion has been considered carefully by your caregiver before blood is given. Blood is not given unless the benefits outweigh the risks. AFTER THE TRANSFUSION  Right after receiving a blood transfusion, you will usually feel much better and more energetic. This is especially true if your red blood cells have gotten low (anemic). The transfusion raises the level of the red blood cells which carry oxygen, and this usually causes an energy increase.  The nurse administering the transfusion will monitor you carefully for complications. HOME CARE INSTRUCTIONS  No special instructions are needed after a transfusion. You may find your energy is better. Speak with your caregiver about any limitations on activity for underlying diseases you may have. SEEK MEDICAL CARE IF:   Your condition is not improving after your transfusion.  You develop redness or irritation at the intravenous (IV) site. SEEK IMMEDIATE MEDICAL CARE IF:  Any of the following symptoms occur  over the next 12 hours:  Shaking chills.  You have a temperature by mouth above 102 F (38.9 C), not controlled by medicine.  Chest, back, or muscle pain.  People around you feel you are not acting correctly or are confused.  Shortness of breath or difficulty breathing.  Dizziness and fainting.  You get a rash or develop hives.  You have a decrease in urine output.  Your urine turns a dark color or changes to pink, red, or brown. Any of the following symptoms occur over the next 10 days:  You have a temperature by mouth above 102 F (38.9 C), not controlled by medicine.  Shortness of breath.  Weakness after normal activity.  The white part of the eye turns yellow (jaundice).  You have a decrease in the amount of urine or are urinating less often.  Your urine turns a dark color or changes to pink, red, or brown. Document Released: 06/26/2000 Document Revised: 09/21/2011 Document Reviewed: 02/13/2008 [facility] Patient Information 2014 [place], [place].  _______________________________________________________________________

## 2017-01-29 NOTE — Patient Outreach (Signed)
[facility] Care Management  01/29/2017  [name] PetrinKristy L [name] 02-Jul-1986 [id number]    Subjective: Telephone call to patient's home / mobile number, spoke with patient, and HIPAA verified.  Discussed [facility] Care Management [facility] Transition of care follow up, preoperative call follow up, patient voiced understanding, and is in agreement to both types of follow up.  Patient states she is doing well and mother will be assisting her post op. States she [facility] and have higher level insurance hospitalization benefit. States she is accessing the following [facility] benefits: outpatient pharmacy, spiritual care (will call for Advanced Directive completion if needed), hospital indemnity (given contact number for [facility] Indemnity [phone]), and family medical leave act (FMLA) is in process with [employer].  Patient advised to obtain copy of FMLA paperwork for her records and to follow up with [employer] as needed.  Patient voices understanding and will follow up.    Objective: Per chart review, patient scheduled to be admitted 02/10/17 for XI ROBOT ASSISTED PARTIAL COLECTOMY.  Patient has a history of colitis, diverticulitis, migraine, and asthma.    Assessment: Received [facility] Preoperative Call follow up referral on 01/26/17.  Preoperative call completed, and transition of care follow up pending notification of patient discharge.    Plan: RNCM will call patient for  telephone outreach attempt, transition of care follow up, within 3 business days of hospital discharge notification.    Ailee Pates H. Gardiner Barefootooper RN, BSN, CCM [facility] Care Management [facility] Telephonic CM Phone: [phone] Fax: [phone]

## 2017-02-03 ENCOUNTER — Encounter (HOSPITAL_COMMUNITY)
Admission: RE | Admit: 2017-02-03 | Discharge: 2017-02-03 | Disposition: A | Discharge status: Home / Self Care | Payer: 59 | Source: Ambulatory Visit | Attending: General Surgery | Admitting: General Surgery

## 2017-02-03 ENCOUNTER — Encounter (HOSPITAL_COMMUNITY): Payer: Self-pay

## 2017-02-03 DIAGNOSIS — Z01812 Encounter for preprocedural laboratory examination: Secondary | ICD-10-CM | POA: Insufficient documentation

## 2017-02-03 DIAGNOSIS — Z01810 Encounter for preprocedural cardiovascular examination: Secondary | ICD-10-CM | POA: Insufficient documentation

## 2017-02-03 HISTORY — DX: Gastro-esophageal reflux disease without esophagitis: K21.9

## 2017-02-03 HISTORY — DX: Other complications of anesthesia, initial encounter: T88.59XA

## 2017-02-03 HISTORY — DX: Other specified postprocedural states: R11.2

## 2017-02-03 HISTORY — DX: Unspecified convulsions: R56.9

## 2017-02-03 HISTORY — DX: Other specified postprocedural states: Z98.890

## 2017-02-03 HISTORY — DX: Myoneural disorder, unspecified: G70.9

## 2017-02-03 HISTORY — DX: Personal history of urinary calculi: Z87.442

## 2017-02-03 HISTORY — DX: Adverse effect of unspecified anesthetic, initial encounter: T41.45XA

## 2017-02-03 HISTORY — DX: Anemia, unspecified: D64.9

## 2017-02-03 LAB — CBC
HCT: 37 % (ref 36.0–46.0)
Hemoglobin: 12.9 g/dL (ref 12.0–15.0)
MCH: 29.8 pg (ref 26.0–34.0)
MCHC: 34.9 g/dL (ref 30.0–36.0)
MCV: 85.5 fL (ref 78.0–100.0)
Platelets: 274 10*3/uL (ref 150–400)
RBC: 4.33 MIL/uL (ref 3.87–5.11)
RDW: 12.5 % (ref 11.5–15.5)
WBC: 10.4 10*3/uL (ref 4.0–10.5)

## 2017-02-03 LAB — BASIC METABOLIC PANEL
Anion gap: 8 (ref 5–15)
BUN: 18 mg/dL (ref 6–20)
CO2: 27 mmol/L (ref 22–32)
Calcium: 9.3 mg/dL (ref 8.9–10.3)
Chloride: 106 mmol/L (ref 101–111)
Creatinine, Ser: 1.07 mg/dL — ABNORMAL HIGH (ref 0.44–1.00)
GFR calc Af Amer: >60 mL/min (ref >60)
GFR calc non Af Amer: >60 mL/min (ref >60)
Glucose, Bld: 95 mg/dL (ref 65–99)
Potassium: 4.1 mmol/L (ref 3.5–5.1)
Sodium: 141 mmol/L (ref 135–145)

## 2017-02-03 LAB — ABO/RH: ABO/RH(D): O POS

## 2017-02-03 LAB — HCG, SERUM, QUALITATIVE: Preg, Serum: NEGATIVE

## 2017-02-03 NOTE — Consult Note (Signed)
WOC Nurse ostomy consult note:  WOC Nurse requested for preoperative stoma site marking by Dr. Romie LeveeAlicia Thomas.  DOS is Wednesday, August 1.  Discussed surgical procedure and stoma creation with patient.  Explained role of the WOC nurse team.  Answered patient questions. 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  She relays a family history of ulcerative colitis and reports a perforation in 2014. She is tearful during the early minutes of our appointment and weeping during the marking. I do not offer educational materials as patient has been on the Internet and is very hopeful that she will not require an ostomy.  Support and reassurance provided. She reports that she lives with her mother and that her mother will be with her periodically during her hospitalization.  Examined patient sitting, and standing in order to place the marking in the patient's visual field, away from any creases or abdominal contour issues and within the rectus muscle.  She has a soft rotund abdomen with two seep skin folds; both the colostomy site and an ileostomy site are marked above the umbilicus and patient is able to visualize both markings.  Marked for colostomy in the LLQ  8.0 cm to the left of the umbilicus and 4.5 cm above the umbilicus.  Marked for ileostomy in the RLQ  9.5 cm to the right of the umbilicus and  3.0 cm above/below the umbilicus.  Patient's abdomen cleansed with CHG wipes at area of site markings, allowed to air dry prior to marking with surgical skin marking pen. Covered marks with thin film transparent dressings to preserve mark until date of surgery.   WOC nursing team will follow, and will remain available to this patient, the nursing, surgical and medical teams should she have an ostomy created intraoperatively.  Please re-consult if an ostomy is created intraoperatively.  Thank you for inviting us to mark this nice patient and participate on her care team if needed. Ladona MowLaurie  Marca Gadsby, MSN, RN, GNP, Leda MinWOCN, CWON-AP, FAAN  Pager # [phone]

## 2017-02-03 NOTE — Progress Notes (Signed)
Final EKG done 02/03/17-epic  

## 2017-02-05 MED FILL — RIZATRIPTAN 10 MG ODT: 10 | 15 days supply | Qty: 9 | Fill #0

## 2017-02-10 ENCOUNTER — Inpatient Hospital Stay (HOSPITAL_COMMUNITY): Payer: 59 | Admitting: Anesthesiology

## 2017-02-10 ENCOUNTER — Inpatient Hospital Stay (HOSPITAL_COMMUNITY): Admission: RE | Admit: 2017-02-10 | Payer: 59 | Source: Ambulatory Visit | Admitting: General Surgery

## 2017-02-10 ENCOUNTER — Encounter (HOSPITAL_COMMUNITY): Admission: RE | Payer: Self-pay | Source: Ambulatory Visit

## 2017-02-10 ENCOUNTER — Encounter (HOSPITAL_COMMUNITY): Payer: Self-pay | Admitting: *Deleted

## 2017-02-10 ENCOUNTER — Inpatient Hospital Stay (HOSPITAL_COMMUNITY)
Admission: RE | Admit: 2017-02-10 | Discharge: 2017-02-14 | DRG: 330 | Disposition: A | Discharge status: Home / Self Care | Payer: 59 | Source: Ambulatory Visit | Attending: General Surgery | Admitting: General Surgery

## 2017-02-10 ENCOUNTER — Encounter (HOSPITAL_COMMUNITY)
Admission: RE | Disposition: A | Discharge status: Home / Self Care | Payer: Self-pay | Source: Ambulatory Visit | Attending: General Surgery

## 2017-02-10 DIAGNOSIS — Z6841 Body Mass Index (BMI) 40.0 and over, adult: Secondary | ICD-10-CM | POA: Diagnosis not present

## 2017-02-10 DIAGNOSIS — Z8371 Family history of colonic polyps: Secondary | ICD-10-CM | POA: Diagnosis not present

## 2017-02-10 DIAGNOSIS — J45909 Unspecified asthma, uncomplicated: Secondary | ICD-10-CM | POA: Diagnosis present

## 2017-02-10 DIAGNOSIS — K5732 Diverticulitis of large intestine without perforation or abscess without bleeding: Principal | ICD-10-CM | POA: Diagnosis present

## 2017-02-10 DIAGNOSIS — K219 Gastro-esophageal reflux disease without esophagitis: Secondary | ICD-10-CM | POA: Diagnosis present

## 2017-02-10 DIAGNOSIS — N805 Endometriosis of intestine: Secondary | ICD-10-CM | POA: Diagnosis present

## 2017-02-10 DIAGNOSIS — Z888 Allergy status to other drugs, medicaments and biological substances status: Secondary | ICD-10-CM | POA: Diagnosis not present

## 2017-02-10 DIAGNOSIS — Z885 Allergy status to narcotic agent status: Secondary | ICD-10-CM | POA: Diagnosis not present

## 2017-02-10 DIAGNOSIS — K5790 Diverticulosis of intestine, part unspecified, without perforation or abscess without bleeding: Secondary | ICD-10-CM | POA: Diagnosis present

## 2017-02-10 HISTORY — PX: BOWEL RESECTION: SHX1257

## 2017-02-10 LAB — TYPE AND SCREEN
ABO/RH(D): O POS
Antibody Screen: NEGATIVE

## 2017-02-10 SURGERY — COLECTOMY, PARTIAL, ROBOT-ASSISTED, LAPAROSCOPIC
Anesthesia: General

## 2017-02-10 SURGERY — COLECTOMY, PARTIAL, ROBOT-ASSISTED, LAPAROSCOPIC
Anesthesia: General | Site: Abdomen

## 2017-02-10 MED ORDER — GABAPENTIN 300 MG PO CAPS
300.0000 mg | ORAL_CAPSULE | ORAL | Status: AC
  Administered 2017-02-10: 300 mg via ORAL
  Filled 2017-02-10: qty 1

## 2017-02-10 MED ORDER — CELECOXIB 200 MG PO CAPS
200.0000 mg | ORAL_CAPSULE | Freq: Two times a day (BID) | ORAL | Status: DC
  Administered 2017-02-10 – 2017-02-13 (×7): 200 mg via ORAL
  Filled 2017-02-10 (×7): qty 1

## 2017-02-10 MED ORDER — SUGAMMADEX SODIUM 200 MG/2ML IV SOLN
INTRAVENOUS | Status: AC
  Filled 2017-02-10: qty 2

## 2017-02-10 MED ORDER — DIPHENHYDRAMINE HCL 25 MG PO CAPS
25.0000 mg | ORAL_CAPSULE | Freq: Four times a day (QID) | ORAL | Status: DC | PRN
  Administered 2017-02-12: 25 mg via ORAL
  Filled 2017-02-10: qty 1

## 2017-02-10 MED ORDER — HYDROMORPHONE HCL-NACL 0.5-0.9 MG/ML-% IV SOSY
PREFILLED_SYRINGE | INTRAVENOUS | Status: AC
  Filled 2017-02-10: qty 2

## 2017-02-10 MED ORDER — ROCURONIUM BROMIDE 50 MG/5ML IV SOSY
PREFILLED_SYRINGE | INTRAVENOUS | Status: AC
  Filled 2017-02-10: qty 5

## 2017-02-10 MED ORDER — LIDOCAINE 2% (20 MG/ML) 5 ML SYRINGE
INTRAMUSCULAR | Status: DC | PRN
  Administered 2017-02-10: 100 mg via INTRAVENOUS

## 2017-02-10 MED ORDER — FENTANYL CITRATE (PF) 250 MCG/5ML IJ SOLN
INTRAMUSCULAR | Status: DC | PRN
  Administered 2017-02-10 (×5): 50 ug via INTRAVENOUS

## 2017-02-10 MED ORDER — LIDOCAINE 2% (20 MG/ML) 5 ML SYRINGE
INTRAMUSCULAR | Status: AC
  Filled 2017-02-10: qty 5

## 2017-02-10 MED ORDER — PANTOPRAZOLE SODIUM 20 MG PO TBEC
20.0000 mg | DELAYED_RELEASE_TABLET | Freq: Every day | ORAL | Status: DC
  Administered 2017-02-11 – 2017-02-13 (×3): 20 mg via ORAL
  Filled 2017-02-10 (×4): qty 1

## 2017-02-10 MED ORDER — ONDANSETRON HCL 4 MG/2ML IJ SOLN
INTRAMUSCULAR | Status: AC
  Filled 2017-02-10: qty 2

## 2017-02-10 MED ORDER — SUGAMMADEX SODIUM 200 MG/2ML IV SOLN
INTRAVENOUS | Status: DC | PRN
  Administered 2017-02-10: 200 mg via INTRAVENOUS

## 2017-02-10 MED ORDER — MIDAZOLAM HCL 2 MG/2ML IJ SOLN
INTRAMUSCULAR | Status: AC
  Filled 2017-02-10: qty 2

## 2017-02-10 MED ORDER — EPHEDRINE 5 MG/ML INJ
INTRAVENOUS | Status: AC
  Filled 2017-02-10: qty 10

## 2017-02-10 MED ORDER — ONDANSETRON HCL 4 MG/2ML IJ SOLN
INTRAMUSCULAR | Status: DC | PRN
  Administered 2017-02-10 (×2): 4 mg via INTRAVENOUS

## 2017-02-10 MED ORDER — HYOSCYAMINE SULFATE 0.125 MG PO TBDP: 0.1250 mg | ORAL_TABLET | ORAL | Status: DC | PRN

## 2017-02-10 MED ORDER — KETAMINE HCL 10 MG/ML IJ SOLN
INTRAMUSCULAR | Status: DC | PRN
  Administered 2017-02-10: 25 mg via INTRAVENOUS

## 2017-02-10 MED ORDER — BUPIVACAINE-EPINEPHRINE 0.25% -1:200000 IJ SOLN
INTRAMUSCULAR | Status: AC
  Filled 2017-02-10: qty 1

## 2017-02-10 MED ORDER — DEXTROSE 5 % IV SOLN
2.0000 g | INTRAVENOUS | Status: AC
  Administered 2017-02-10: 2 g via INTRAVENOUS
  Filled 2017-02-10: qty 2

## 2017-02-10 MED ORDER — KCL IN DEXTROSE-NACL 20-5-0.45 MEQ/L-%-% IV SOLN
INTRAVENOUS | Status: DC
  Administered 2017-02-10 – 2017-02-12 (×3): via INTRAVENOUS
  Filled 2017-02-10 (×3): qty 1000

## 2017-02-10 MED ORDER — ACETAMINOPHEN 500 MG PO TABS
1000.0000 mg | ORAL_TABLET | Freq: Four times a day (QID) | ORAL | Status: DC
  Administered 2017-02-10 – 2017-02-12 (×6): 1000 mg via ORAL
  Filled 2017-02-10 (×6): qty 2

## 2017-02-10 MED ORDER — METOCLOPRAMIDE HCL 10 MG PO TABS
10.0000 mg | ORAL_TABLET | Freq: Four times a day (QID) | ORAL | Status: DC | PRN
  Administered 2017-02-10 – 2017-02-12 (×2): 10 mg via ORAL
  Filled 2017-02-10 (×2): qty 1

## 2017-02-10 MED ORDER — CEFOTETAN DISODIUM-DEXTROSE 2-2.08 GM-% IV SOLR
INTRAVENOUS | Status: AC
  Filled 2017-02-10: qty 50

## 2017-02-10 MED ORDER — CELECOXIB 200 MG PO CAPS
400.0000 mg | ORAL_CAPSULE | ORAL | Status: AC
  Administered 2017-02-10: 400 mg via ORAL
  Filled 2017-02-10: qty 2

## 2017-02-10 MED ORDER — LACTATED RINGERS IV SOLN
INTRAVENOUS | Status: DC
  Administered 2017-02-10: 11:00:00 via INTRAVENOUS
  Administered 2017-02-10: 1000 mL via INTRAVENOUS
  Administered 2017-02-10: 08:00:00 via INTRAVENOUS

## 2017-02-10 MED ORDER — ALVIMOPAN 12 MG PO CAPS
12.0000 mg | ORAL_CAPSULE | Freq: Two times a day (BID) | ORAL | Status: DC
  Administered 2017-02-11 (×2): 12 mg via ORAL
  Filled 2017-02-10 (×2): qty 1

## 2017-02-10 MED ORDER — FENTANYL CITRATE (PF) 100 MCG/2ML IJ SOLN
INTRAMUSCULAR | Status: AC
  Filled 2017-02-10: qty 4

## 2017-02-10 MED ORDER — CLONAZEPAM 0.5 MG PO TABS
0.2500 mg | ORAL_TABLET | Freq: Every day | ORAL | Status: DC
  Administered 2017-02-10 – 2017-02-13 (×4): 0.25 mg via ORAL
  Filled 2017-02-10 (×4): qty 1

## 2017-02-10 MED ORDER — ENOXAPARIN SODIUM 40 MG/0.4ML ~~LOC~~ SOLN
40.0000 mg | SUBCUTANEOUS | Status: DC
  Administered 2017-02-11 – 2017-02-14 (×4): 40 mg via SUBCUTANEOUS
  Filled 2017-02-10 (×4): qty 0.4

## 2017-02-10 MED ORDER — ROCURONIUM BROMIDE 50 MG/5ML IV SOSY
PREFILLED_SYRINGE | INTRAVENOUS | Status: DC | PRN
  Administered 2017-02-10 (×2): 10 mg via INTRAVENOUS
  Administered 2017-02-10: 50 mg via INTRAVENOUS

## 2017-02-10 MED ORDER — KETAMINE HCL 10 MG/ML IJ SOLN
INTRAMUSCULAR | Status: AC
  Filled 2017-02-10: qty 1

## 2017-02-10 MED ORDER — DIPHENHYDRAMINE HCL 50 MG/ML IJ SOLN: 25.0000 mg | Freq: Four times a day (QID) | INTRAMUSCULAR | Status: DC | PRN

## 2017-02-10 MED ORDER — LIDOCAINE 2% (20 MG/ML) 5 ML SYRINGE
INTRAMUSCULAR | Status: DC | PRN
  Administered 2017-02-10: 1.5 mg/kg/h via INTRAVENOUS

## 2017-02-10 MED ORDER — FENTANYL CITRATE (PF) 250 MCG/5ML IJ SOLN
INTRAMUSCULAR | Status: AC
  Filled 2017-02-10: qty 5

## 2017-02-10 MED ORDER — HYOSCYAMINE SULFATE 0.125 MG SL SUBL
0.1250 mg | SUBLINGUAL_TABLET | SUBLINGUAL | Status: DC | PRN
  Administered 2017-02-10 – 2017-02-11 (×3): 0.125 mg via SUBLINGUAL
  Filled 2017-02-10 (×5): qty 1

## 2017-02-10 MED ORDER — PROPRANOLOL HCL ER 60 MG PO CP24
60.0000 mg | ORAL_CAPSULE | Freq: Every day | ORAL | Status: DC
  Administered 2017-02-11 – 2017-02-13 (×3): 60 mg via ORAL
  Filled 2017-02-10 (×4): qty 1

## 2017-02-10 MED ORDER — SUMATRIPTAN SUCCINATE 50 MG PO TABS
100.0000 mg | ORAL_TABLET | ORAL | Status: DC | PRN
  Filled 2017-02-10: qty 2

## 2017-02-10 MED ORDER — DEXAMETHASONE SODIUM PHOSPHATE 10 MG/ML IJ SOLN
INTRAMUSCULAR | Status: DC | PRN
  Administered 2017-02-10: 5 mg via INTRAVENOUS

## 2017-02-10 MED ORDER — BUPIVACAINE-EPINEPHRINE 0.25% -1:200000 IJ SOLN
INTRAMUSCULAR | Status: DC | PRN
  Administered 2017-02-10: 50 mL

## 2017-02-10 MED ORDER — HYDROMORPHONE HCL-NACL 0.5-0.9 MG/ML-% IV SOSY
0.2500 mg | PREFILLED_SYRINGE | INTRAVENOUS | Status: DC | PRN
  Administered 2017-02-10 (×2): 0.5 mg via INTRAVENOUS

## 2017-02-10 MED ORDER — LEVALBUTEROL HCL 0.63 MG/3ML IN NEBU: 0.6300 mg | INHALATION_SOLUTION | RESPIRATORY_TRACT | Status: DC | PRN

## 2017-02-10 MED ORDER — HEPARIN SODIUM (PORCINE) 5000 UNIT/ML IJ SOLN
5000.0000 [IU] | Freq: Once | INTRAMUSCULAR | Status: AC
  Administered 2017-02-10: 5000 [IU] via SUBCUTANEOUS
  Filled 2017-02-10: qty 1

## 2017-02-10 MED ORDER — PROPOFOL 10 MG/ML IV BOLUS
INTRAVENOUS | Status: AC
  Filled 2017-02-10: qty 40

## 2017-02-10 MED ORDER — EPHEDRINE SULFATE 50 MG/ML IJ SOLN
INTRAMUSCULAR | Status: DC | PRN
  Administered 2017-02-10 (×2): 10 mg via INTRAVENOUS

## 2017-02-10 MED ORDER — ONDANSETRON HCL 4 MG PO TABS
4.0000 mg | ORAL_TABLET | Freq: Four times a day (QID) | ORAL | Status: DC | PRN
  Administered 2017-02-13 – 2017-02-14 (×2): 4 mg via ORAL
  Filled 2017-02-10 (×2): qty 1

## 2017-02-10 MED ORDER — ALUM & MAG HYDROXIDE-SIMETH 200-200-20 MG/5ML PO SUSP
30.0000 mL | Freq: Four times a day (QID) | ORAL | Status: DC | PRN
  Administered 2017-02-12: 30 mL via ORAL
  Filled 2017-02-10: qty 30

## 2017-02-10 MED ORDER — SUCCINYLCHOLINE CHLORIDE 200 MG/10ML IV SOSY
PREFILLED_SYRINGE | INTRAVENOUS | Status: DC | PRN
  Administered 2017-02-10: 100 mg via INTRAVENOUS

## 2017-02-10 MED ORDER — LACTATED RINGERS IR SOLN
Status: DC | PRN
  Administered 2017-02-10: 1000 mL

## 2017-02-10 MED ORDER — SCOPOLAMINE 1 MG/3DAYS TD PT72
MEDICATED_PATCH | TRANSDERMAL | Status: AC
Start: 2017-02-10 — End: 2017-02-10
  Filled 2017-02-10: qty 1

## 2017-02-10 MED ORDER — MIDAZOLAM HCL 5 MG/5ML IJ SOLN
INTRAMUSCULAR | Status: DC | PRN
  Administered 2017-02-10 (×2): 1 mg via INTRAVENOUS

## 2017-02-10 MED ORDER — PROPOFOL 10 MG/ML IV BOLUS
INTRAVENOUS | Status: DC | PRN
  Administered 2017-02-10: 200 mg via INTRAVENOUS

## 2017-02-10 MED ORDER — LEVALBUTEROL TARTRATE 45 MCG/ACT IN AERO: 2.0000 | INHALATION_SPRAY | RESPIRATORY_TRACT | Status: DC | PRN

## 2017-02-10 MED ORDER — ALVIMOPAN 12 MG PO CAPS
12.0000 mg | ORAL_CAPSULE | ORAL | Status: AC
  Administered 2017-02-10: 12 mg via ORAL
  Filled 2017-02-10: qty 1

## 2017-02-10 MED ORDER — SUCCINYLCHOLINE CHLORIDE 200 MG/10ML IV SOSY
PREFILLED_SYRINGE | INTRAVENOUS | Status: AC
  Filled 2017-02-10: qty 10

## 2017-02-10 MED ORDER — ONDANSETRON HCL 4 MG/2ML IJ SOLN: 4.0000 mg | Freq: Four times a day (QID) | INTRAMUSCULAR | Status: DC | PRN

## 2017-02-10 MED ORDER — DEXAMETHASONE SODIUM PHOSPHATE 10 MG/ML IJ SOLN
INTRAMUSCULAR | Status: AC
  Filled 2017-02-10: qty 1

## 2017-02-10 MED ORDER — BUPIVACAINE LIPOSOME 1.3 % IJ SUSP
20.0000 mL | Freq: Once | INTRAMUSCULAR | Status: AC
  Administered 2017-02-10: 20 mL
  Filled 2017-02-10: qty 20

## 2017-02-10 MED ORDER — SCOPOLAMINE 1 MG/3DAYS TD PT72
1.0000 | MEDICATED_PATCH | Freq: Once | TRANSDERMAL | Status: DC
  Administered 2017-02-10: 1.5 mg via TRANSDERMAL

## 2017-02-10 MED ORDER — MORPHINE SULFATE (PF) 2 MG/ML IV SOLN
2.0000 mg | INTRAVENOUS | Status: DC | PRN
  Administered 2017-02-10 – 2017-02-11 (×4): 2 mg via INTRAVENOUS
  Administered 2017-02-11 (×3): 4 mg via INTRAVENOUS
  Administered 2017-02-11: 2 mg via INTRAVENOUS
  Filled 2017-02-10: qty 2
  Filled 2017-02-10 (×5): qty 1
  Filled 2017-02-10 (×2): qty 2

## 2017-02-10 MED ORDER — LIDOCAINE 2% (20 MG/ML) 5 ML SYRINGE
INTRAMUSCULAR | Status: AC
  Filled 2017-02-10: qty 15

## 2017-02-10 MED ORDER — FENTANYL CITRATE (PF) 100 MCG/2ML IJ SOLN
25.0000 ug | INTRAMUSCULAR | Status: DC | PRN
  Administered 2017-02-10 (×3): 50 ug via INTRAVENOUS

## 2017-02-10 MED ORDER — PROMETHAZINE HCL 25 MG/ML IJ SOLN: 6.2500 mg | INTRAMUSCULAR | Status: DC | PRN

## 2017-02-10 MED ORDER — SACCHAROMYCES BOULARDII 250 MG PO CAPS
250.0000 mg | ORAL_CAPSULE | Freq: Two times a day (BID) | ORAL | Status: DC
  Administered 2017-02-10 – 2017-02-13 (×6): 250 mg via ORAL
  Filled 2017-02-10 (×6): qty 1

## 2017-02-10 MED ORDER — 0.9 % SODIUM CHLORIDE (POUR BTL) OPTIME
TOPICAL | Status: DC | PRN
  Administered 2017-02-10: 2000 mL

## 2017-02-10 MED ORDER — ACETAMINOPHEN 500 MG PO TABS
1000.0000 mg | ORAL_TABLET | ORAL | Status: AC
  Administered 2017-02-10: 1000 mg via ORAL
  Filled 2017-02-10: qty 2

## 2017-02-10 SURGICAL SUPPLY — 94 items
BLADE EXTENDED COATED 6.5IN (ELECTRODE) IMPLANT
CANNULA REDUC XI 12-8 STAPL (CANNULA) ×1
CANNULA REDUC XI 12-8MM STAPL (CANNULA) ×1
CANNULA REDUCER 12-8 DVNC XI (CANNULA) ×1 IMPLANT
CELLS DAT CNTRL 66122 CELL SVR (MISCELLANEOUS) IMPLANT
CLIP LIGATING HEM O LOK PURPLE (MISCELLANEOUS) IMPLANT
CLIP LIGATING HEMOLOK MED (MISCELLANEOUS) IMPLANT
COVER SURGICAL LIGHT HANDLE (MISCELLANEOUS) ×3 IMPLANT
COVER TIP SHEARS 8 DVNC (MISCELLANEOUS) ×2 IMPLANT
COVER TIP SHEARS 8MM DA VINCI (MISCELLANEOUS) ×4
DECANTER SPIKE VIAL GLASS SM (MISCELLANEOUS) IMPLANT
DERMABOND ADVANCED (GAUZE/BANDAGES/DRESSINGS) ×2
DERMABOND ADVANCED .7 DNX12 (GAUZE/BANDAGES/DRESSINGS) ×1 IMPLANT
DEVICE PMI PUNCTURE CLOSURE (MISCELLANEOUS) ×3 IMPLANT
DRAIN CHANNEL 19F RND (DRAIN) IMPLANT
DRAPE ARM DVNC X/XI (DISPOSABLE) ×4 IMPLANT
DRAPE COLUMN DVNC XI (DISPOSABLE) ×1 IMPLANT
DRAPE DA VINCI XI ARM (DISPOSABLE) ×8
DRAPE DA VINCI XI COLUMN (DISPOSABLE) ×2
DRAPE SURG IRRIG POUCH 19X23 (DRAPES) ×3 IMPLANT
DRSG OPSITE POSTOP 4X10 (GAUZE/BANDAGES/DRESSINGS) IMPLANT
DRSG OPSITE POSTOP 4X6 (GAUZE/BANDAGES/DRESSINGS) ×3 IMPLANT
DRSG OPSITE POSTOP 4X8 (GAUZE/BANDAGES/DRESSINGS) IMPLANT
ELECT PENCIL ROCKER SW 15FT (MISCELLANEOUS) ×6 IMPLANT
ELECT REM PT RETURN 15FT ADLT (MISCELLANEOUS) ×3 IMPLANT
ENDOLOOP SUT PDS II  0 18 (SUTURE)
ENDOLOOP SUT PDS II 0 18 (SUTURE) IMPLANT
EVACUATOR SILICONE 100CC (DRAIN) IMPLANT
GAUZE SPONGE 4X4 12PLY STRL (GAUZE/BANDAGES/DRESSINGS) IMPLANT
GLOVE BIO SURGEON STRL SZ 6.5 (GLOVE) ×6 IMPLANT
GLOVE BIO SURGEONS STRL SZ 6.5 (GLOVE) ×3
GLOVE BIOGEL PI IND STRL 7.0 (GLOVE) ×3 IMPLANT
GLOVE BIOGEL PI INDICATOR 7.0 (GLOVE) ×6
GOWN STRL REUS W/TWL 2XL LVL3 (GOWN DISPOSABLE) ×9 IMPLANT
GOWN STRL REUS W/TWL XL LVL3 (GOWN DISPOSABLE) ×18 IMPLANT
GRASPER ENDOPATH ANVIL 10MM (MISCELLANEOUS) IMPLANT
HOLDER FOLEY CATH W/STRAP (MISCELLANEOUS) ×6 IMPLANT
IRRIG SUCT STRYKERFLOW 2 WTIP (MISCELLANEOUS) ×3
IRRIGATION SUCT STRKRFLW 2 WTP (MISCELLANEOUS) ×1 IMPLANT
KIT PROCEDURE DA VINCI SI (MISCELLANEOUS) ×2
KIT PROCEDURE DVNC SI (MISCELLANEOUS) ×1 IMPLANT
NEEDLE INSUFFLATION 14GA 120MM (NEEDLE) ×3 IMPLANT
PACK CARDIOVASCULAR III (CUSTOM PROCEDURE TRAY) ×3 IMPLANT
PACK COLON (CUSTOM PROCEDURE TRAY) ×3 IMPLANT
PORT LAP GEL ALEXIS MED 5-9CM (MISCELLANEOUS) IMPLANT
RTRCTR WOUND ALEXIS 18CM MED (MISCELLANEOUS)
SCISSORS LAP 5X35 DISP (ENDOMECHANICALS) ×3 IMPLANT
SEAL CANN UNIV 5-8 DVNC XI (MISCELLANEOUS) ×3 IMPLANT
SEAL XI 5MM-8MM UNIVERSAL (MISCELLANEOUS) ×6
SEALER VESSEL DA VINCI XI (MISCELLANEOUS) ×2
SEALER VESSEL EXT DVNC XI (MISCELLANEOUS) ×1 IMPLANT
SLEEVE ADV FIXATION 5X100MM (TROCAR) IMPLANT
SOLUTION ELECTROLUBE (MISCELLANEOUS) ×3 IMPLANT
STAPLER 45 BLU RELOAD XI (STAPLE) ×2 IMPLANT
STAPLER 45 BLUE RELOAD XI (STAPLE) ×4
STAPLER 45 GREEN RELOAD XI (STAPLE)
STAPLER 45 GRN RELOAD XI (STAPLE) IMPLANT
STAPLER CANNULA SEAL DVNC XI (STAPLE) ×1 IMPLANT
STAPLER CANNULA SEAL XI (STAPLE) ×2
STAPLER CIRC ILS CVD 33MM 37CM (STAPLE) ×3 IMPLANT
STAPLER SHEATH (SHEATH) ×2
STAPLER SHEATH ENDOWRIST DVNC (SHEATH) ×1 IMPLANT
STAPLER VISISTAT 35W (STAPLE) IMPLANT
SUT ETHILON 2 0 PS N (SUTURE) IMPLANT
SUT NOVA NAB GS-21 0 18 T12 DT (SUTURE) ×3 IMPLANT
SUT NOVA NAB GS-21 1 T12 (SUTURE) IMPLANT
SUT PDS AB 1 CTX 36 (SUTURE) IMPLANT
SUT PDS AB 1 TP1 96 (SUTURE) IMPLANT
SUT PROLENE 2 0 KS (SUTURE) ×3 IMPLANT
SUT SILK 2 0 (SUTURE) ×2
SUT SILK 2 0 SH CR/8 (SUTURE) ×3 IMPLANT
SUT SILK 2-0 18XBRD TIE 12 (SUTURE) ×1 IMPLANT
SUT SILK 3 0 (SUTURE) ×2
SUT SILK 3 0 SH CR/8 (SUTURE) ×3 IMPLANT
SUT SILK 3-0 18XBRD TIE 12 (SUTURE) ×1 IMPLANT
SUT V-LOC BARB 180 2/0GR6 GS22 (SUTURE)
SUT VIC AB 2-0 SH 18 (SUTURE) ×3 IMPLANT
SUT VIC AB 2-0 SH 27 (SUTURE) ×4
SUT VIC AB 2-0 SH 27X BRD (SUTURE) ×2 IMPLANT
SUT VIC AB 3-0 SH 18 (SUTURE) ×3 IMPLANT
SUT VIC AB 4-0 PS2 18 (SUTURE) ×6 IMPLANT
SUT VIC AB 4-0 PS2 27 (SUTURE) ×6 IMPLANT
SUT VICRYL 0 UR6 27IN ABS (SUTURE) ×3 IMPLANT
SUTURE V-LC BRB 180 2/0GR6GS22 (SUTURE) IMPLANT
SYR 10ML ECCENTRIC (SYRINGE) ×3 IMPLANT
SYS LAPSCP GELPORT 120MM (MISCELLANEOUS)
SYSTEM LAPSCP GELPORT 120MM (MISCELLANEOUS) IMPLANT
TOWEL OR 17X26 10 PK STRL BLUE (TOWEL DISPOSABLE) ×3 IMPLANT
TOWEL OR NON WOVEN STRL DISP B (DISPOSABLE) ×3 IMPLANT
TRAY FOLEY CATH 14FRSI W/METER (CATHETERS) ×3 IMPLANT
TROCAR ADV FIXATION 5X100MM (TROCAR) ×3 IMPLANT
TUBING CONNECTING 10 (TUBING) ×4 IMPLANT
TUBING CONNECTING 10' (TUBING) ×2
TUBING INSUFFLATION 10FT LAP (TUBING) ×3 IMPLANT

## 2017-02-10 NOTE — H&P (Signed)
The patient is a 31 year old female who presents with diverticulitis. Referred to by Dr. Vilinda BoehringerJim Edwards for a surgical opinion regarding recurring bouts of diverticulitis.  The patient was previously seen by Dr. Derrell LollingIngram and me in 2016.  She states that she started having problems in July 2013. She has intermittent problems with abdominal pain, left lower quadrant pain, cramps, nausea vomiting and diarrhea. She has some daily irritable bowel syndrome symptoms with 3-4 soft bowel movements each day. She has episodes of sharp left lower quadrant pain about every 2 months. She's been treated with antibiotics. She's had multiple CT scans over the past 3 or 4 years. She has seen some blood.  CT scan in 8/14 showed colitis involving the distal descending colon and sigmoid colon. No abscess noted CT scan 10/15 showed some prominence and mild thickening of the distal descending and sigmoid colon. CT scan in April 2018 showed recurrent diverticulitis  She did have a colonoscopy by Dr. Randa EvensEdwards in November 2015. Only diverticulosis in the rectosigmoid was noted. No inflammation. Biopsy showed benign colorectal mucosa. Benign lymphoid aggregates. No evidence of microscopic colitis or dysplasia. She apparently had a colonoscopy several years ago by Dr. Kinnie ScalesMedoff and states that didn't show much. She has a family history of diverticulosis in the father and ulcerative colitis in 3 of her father's sisters.  She had approximately one year of minimal symptoms, but began having more severe LLQ pain. She was started on Bactrim that was not helpful. She was switched over to Cipro/Flagyl which did help. She has nausea related to the Flagyl. Repeat CT scan on 10/30/16 showed proximal sigmoid colon thickening with surrounding inflammatory change.     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 Oral Surgery Gallbladder Surgery - Laparoscopic  Diagnostic Studies History Romie Levee(Jamelia Varano, MD; 11/23/2016 2:25 PM) Colonoscopy within last year Mammogram never Pap Smear 1-5 years ago  Allergies  Tylenol *ANALGESICS - NonNarcotic*  Medication History  Oxycodone-Acetaminophen (5-325MG  Tablet, Oral) Active. Pantoprazole Sodium (20MG  Tablet DR, Oral) Active. SUMAtriptan Succinate (100MG  Tablet, Oral) Active. Rizatriptan Benzoate (10MG  Tablet Disint, Oral) Active. Propranolol HCl ER (60MG  Capsule ER 24HR, Oral) Active. Ciprofloxacin HCl (500MG  Tablet, Oral) Active. MetroNIDAZOLE (500MG  Tablet, Oral) Active. Promethazine HCl (12.5MG  Tablet, Oral) Active.   Social History  Caffeine use Carbonated beverages, Tea. No drug use Tobacco use Never smoker. Alcohol use Occasional alcohol use.  Family History  Colon Polyps Father. Hypertension Mother. Respiratory Condition Father.  Pregnancy / Birth History  Contraceptive History Oral contraceptives. Gravida 0 Para 0 Age at menarche 11 years. Regular periods  Other Problems  Asthma Diverticulosis Gastroesophageal Reflux Disease Arthritis Anxiety Disorder COLITIS (K52.9)     Review of Systems  General Present- Fatigue. Not Present- Appetite Loss, Chills, Fever, Night Sweats, Weight Gain and Weight Loss. Skin Not Present- Change in Wart/Mole, Dryness, Hives, Jaundice, New Lesions, Non-Healing Wounds, Rash and Ulcer. HEENT Present- Seasonal Allergies. Not Present- Earache, Hearing Loss, Hoarseness, Nose Bleed, Oral Ulcers, Ringing in the Ears, Sinus Pain, Sore Throat, Visual Disturbances, Wears glasses/contact lenses and Yellow Eyes. Respiratory Not Present- Bloody sputum, Chronic Cough,  Difficulty Breathing, Snoring and Wheezing. Breast Not Present- Breast Mass, Breast Pain, Nipple Discharge and Skin Changes. Cardiovascular Not Present- Chest Pain, Difficulty Breathing Lying Down, Leg Cramps, Palpitations, Rapid Heart  Rate, Shortness of Breath and Swelling of Extremities. Gastrointestinal Present- Abdominal Pain, Bloating, Bloody Stool, Excessive gas, Nausea and Vomiting. Not Present- Change in Bowel Habits, Chronic diarrhea, Constipation, Difficulty Swallowing, Gets full quickly at meals, Hemorrhoids, Indigestion and Rectal Pain. Female Genitourinary Not Present- Frequency, Nocturia, Painful Urination, Pelvic Pain and Urgency. Musculoskeletal Present- Joint Pain. Not Present- Back Pain, Joint Stiffness, Muscle Pain, Muscle Weakness and Swelling of Extremities. Neurological Not Present- Decreased Memory, Fainting, Headaches, Numbness, Seizures, Tingling, Tremor, Trouble walking and Weakness. Psychiatric Not Present- Anxiety, Bipolar, Change in Sleep Pattern, Depression, Fearful and Frequent crying. Endocrine Not Present- Cold Intolerance, Excessive Hunger, Hair Changes, Heat Intolerance, Hot flashes and New Diabetes. Hematology Not Present- Easy Bruising, Excessive bleeding, Gland problems, HIV and Persistent Infections.  .BP 133/68   Pulse (!) 114   Temp 99.2 F (37.3 C) (Oral)   Resp 16   Ht 5' (1.524 m)   Wt 100.7 kg (222 lb)   SpO2 98%   BMI 43.36 kg/m    Physical Exam  General Mental Status-Alert. General Appearance-Consistent with stated age. Hydration-Well hydrated. Voice-Normal.  Head and Neck Head-normocephalic, atraumatic with no lesions or palpable masses. Trachea-midline.  Eye Eyeball - Bilateral-Extraocular movements intact. Sclera/Conjunctiva - Bilateral-No scleral icterus.  Chest and Lung Exam Chest and lung exam reveals -quiet, even and easy respiratory effort with no use of accessory muscles and on auscultation, normal  breath sounds, no adventitious sounds and normal vocal resonance. Inspection Chest Wall - Normal. Back - normal.  Cardiovascular Cardiovascular examination reveals -normal heart sounds, regular rate and rhythm with no murmurs and no digital clubbing, cyanosis, edema, increased warmth or tenderness.  Abdomen Inspection Inspection of the abdomen reveals - No Hernias. Skin - Scar - no surgical scars. Palpation/Percussion Palpation and Percussion of the abdomen reveal - Soft, Non Tender, No Rebound tenderness, No Rigidity (guarding) and No hepatosplenomegaly. Note: Abdomen reveals significant obesity. Really very soft and not distended. No objective tenderness. No mass. No hernia.  Neurologic Neurologic evaluation reveals -alert and oriented x 3 with no impairment of recent or remote memory. Mental Status-Normal.  Musculoskeletal Normal Exam - Left-Upper Extremity Strength Normal and Lower Extremity Strength Normal. Normal Exam - Right-Upper Extremity Strength Normal and Lower Extremity Strength Normal.    Assessment & Plan  SIGMOID DIVERTICULITIS (K57.32) Impression: 31 year old female who presents to the office for evaluation of recurrent diverticulitis. She has had 3-4 major episodes over the last 5-6 years. She has not been hospitalized and it does resolve with medical treatment. We discussed the risk and benefits of surgical resection in detail. We discussed that surgery may not improve her symptoms and she may continue to have diarrhea and abdominal pain afterwards. I think it is reasonable to try to remove the sigmoid if it is causing major changes to her quality of life. The surgery and anatomy were described to the patient as well as the risks of surgery and the possible complications. These include: Bleeding, deep abdominal infections and possible wound complications such as hernia and infection, damage to adjacent structures, leak of surgical connections,  which can lead to other surgeries and possibly an ostomy, possible need for other procedures, such as abscess drains in radiology, possible prolonged hospital stay, possible diarrhea from removal of part of the colon, possible constipation from narcotics, possible bowel, bladder or sexual dysfunction if having rectal surgery, prolonged fatigue/weakness or appetite loss, possible early recurrence of of disease, possible complications of their medical problems such as heart disease or arrhythmias or  lung problems, death (less than 1%). I believe the patient understands and wishes to proceed with the surgery.

## 2017-02-10 NOTE — Anesthesia Preprocedure Evaluation (Signed)
Anesthesia Evaluation  Patient identified by MRN, date of birth, ID band Patient awake    Reviewed: Allergy & Precautions, NPO status , Patient's Chart, lab work & pertinent test results, reviewed documented beta blocker date and time   History of Anesthesia Complications (+) PONV and history of anesthetic complications  Airway Mallampati: II  TM Distance: >3 FB Neck ROM: Full    Dental  (+) Teeth Intact, Dental Advisory Given   Pulmonary asthma ,    Pulmonary exam normal breath sounds clear to auscultation       Cardiovascular negative cardio ROS Normal cardiovascular exam Rhythm:Regular Rate:Normal     Neuro/Psych  Headaches, Seizures -,   Neuromuscular disease (left femoral nerve damage)    GI/Hepatic Neg liver ROS, GERD  Medicated, recurrent diverticular disease   Endo/Other  Morbid obesity  Renal/GU negative Renal ROS     Musculoskeletal negative musculoskeletal ROS (+)   Abdominal   Peds  Hematology negative hematology ROS (+)   Anesthesia Other Findings Day of surgery medications reviewed with the patient.  Reproductive/Obstetrics negative OB ROS                             Anesthesia Physical Anesthesia Plan  ASA: III  Anesthesia Plan: General   Post-op Pain Management:    Induction: Intravenous  PONV Risk Score and Plan: 4 or greater and Ondansetron, Dexamethasone, Midazolam and Scopolamine patch - Pre-op  Airway Management Planned: Oral ETT  Additional Equipment:   Intra-op Plan:   Post-operative Plan: Extubation in OR  Informed Consent: I have reviewed the patients History and Physical, chart, labs and discussed the procedure including the risks, benefits and alternatives for the proposed anesthesia with the patient or authorized representative who has indicated his/her understanding and acceptance.   Dental advisory given  Plan Discussed with:  CRNA  Anesthesia Plan Comments: (ERAS PROTOCOL)        Anesthesia Quick Evaluation

## 2017-02-10 NOTE — Transfer of Care (Signed)
Immediate Anesthesia Transfer of Care Note  Patient: [name] PetrinKristy L [name]  Procedure(s) Performed: Procedure(s): XI ROBOT ASSISTED SIGMOIDECTOMY (N/A)  Patient Location: PACU  Anesthesia Type:General  Level of Consciousness:  sedated, patient cooperative and responds to stimulation  Airway & Oxygen Therapy:Patient Spontanous Breathing and Patient connected to face mask oxgen  Post-op Assessment:  Report given to PACU RN and Post -op Vital signs reviewed and stable  Post vital signs:  Reviewed and stable  Last Vitals:  Vitals:   02/10/17 0632  BP: 133/68  Pulse: (!) 114  Resp: 16  Temp: 37.3 C    Complications: No apparent anesthesia complications

## 2017-02-10 NOTE — Anesthesia Postprocedure Evaluation (Signed)
Anesthesia Post Note  Patient: [name] PetrinKristy L [name]  Procedure(s) Performed: Procedure(s) (LRB): XI ROBOT ASSISTED SIGMOIDECTOMY (N/A)     Patient location during evaluation: PACU Anesthesia Type: General Level of consciousness: awake and alert Pain management: pain level controlled Vital Signs Assessment: post-procedure vital signs reviewed and stable Respiratory status: spontaneous breathing, nonlabored ventilation and respiratory function stable Cardiovascular status: blood pressure returned to baseline and stable Postop Assessment: no signs of nausea or vomiting Anesthetic complications: no    Last Vitals:  Vitals:   02/10/17 1245 02/10/17 1300  BP: 136/70 138/83  Pulse: 82 64  Resp: 18 12  Temp: 36.6 C     Last Pain:  Vitals:   02/10/17 1300  TempSrc:   PainSc: 6                  Cecile HearingStephen Edward Turk

## 2017-02-10 NOTE — Anesthesia Procedure Notes (Signed)
Procedure Name: Intubation Date/Time: 02/10/2017 8:41 AM Performed by: Maxwell Caul Pre-anesthesia Checklist: Patient identified, Emergency Drugs available, Suction available and Patient being monitored Patient Re-evaluated:Patient Re-evaluated prior to induction Oxygen Delivery Method: Circle system utilized Preoxygenation: Pre-oxygenation with 100% oxygen Induction Type: IV induction Ventilation: Mask ventilation without difficulty Laryngoscope Size: Mac and 4 Grade View: Grade I Tube type: Oral Tube size: 7.5 mm Number of attempts: 1 Airway Equipment and Method: Stylet Placement Confirmation: ETT inserted through vocal cords under direct vision,  positive ETCO2 and breath sounds checked- equal and bilateral Secured at: 21 cm Tube secured with: Tape Dental Injury: Teeth and Oropharynx as per pre-operative assessment

## 2017-02-10 NOTE — Op Note (Signed)
02/10/2017  11:15 AM  PATIENT:  [name] [name]  31 y.o. female  Patient Care Team: Joycelyn RuaMeyers, Stephen, MD as PCP - General (Family Medicine) Shelda Palooper, Darlene H, RN as [facility] Care Management  PRE-OPERATIVE DIAGNOSIS:  recurrent diverticular disease  POST-OPERATIVE DIAGNOSIS:  recurrent diverticular disease  PROCEDURE:   XI ROBOT ASSISTED SIGMOIDECTOMY   Surgeon(s): Romie Leveehomas, Samuel Mcpeek, MD Karie SodaGross, Steven, MD  ASSISTANT: Dr Michaell CowingGross   ANESTHESIA:   local and general  EBL: 25ml  Total I/O In: 1000 [I.V.:1000] Out: 125 [Urine:100; Blood:25]  Delay start of Pharmacological VTE agent (>24hrs) due to surgical blood loss or risk of bleeding:  no  DRAINS: none   SPECIMEN:  Source of Specimen:  Sigmoid colon  DISPOSITION OF SPECIMEN:  PATHOLOGY  COUNTS:  YES  PLAN OF CARE: Admit to inpatient   PATIENT DISPOSITION:  PACU - hemodynamically stable.  INDICATION:    31 y.o. F with multiple recurrences of diverticulitis.  I recommended segmental resection:  The anatomy & physiology of the digestive tract was discussed.  The pathophysiology was discussed.  Natural history risks without surgery was discussed.   I worked to give an overview of the disease and the frequent need to have multispecialty involvement.  I feel the risks of no intervention will lead to serious problems that outweigh the operative risks; therefore, I recommended a partial colectomy to remove the pathology.  Laparoscopic & open techniques were discussed.   Risks such as bleeding, infection, abscess, leak, reoperation, possible ostomy, hernia, heart attack, death, and other risks were discussed.  I noted a good likelihood this will help address the problem.   Goals of post-operative recovery were discussed as well.    The patient expressed understanding & wished to proceed with surgery.  OR FINDINGS:   Patient had minimal inflammation and diverticula.  Significant rectosigmoid inflammation involving  bilateral ovaries and base of the uterus, ? endometriosis  The anastomosis rests 14 cm from the anal verge by rigid proctoscopy.  DESCRIPTION:   Informed consent was confirmed.  The patient underwent general anaesthesia without difficulty.  The patient was positioned appropriately.  VTE prevention in place.  The patient's abdomen was clipped, prepped, & draped in a sterile fashion.  Surgical timeout confirmed our plan.  The patient was positioned in reverse Trendelenburg.  Abdominal entry was gained using a varies needle in the LUQ.  Entry was clean.  I induced carbon dioxide insufflation.  An 8mm robotic port was placed.  Camera inspection revealed no injury.  Extra ports were carefully placed under direct laparoscopic visualization. The patient was positioned appropriately.  I reflected the greater omentum and the upper abdomen the small bowel in the upper abdomen.  The robot was then docked to the patient's left side.  Instruments were placed under direct visualization.   I scored the base of peritoneum of the right side of the mesentery of the left colon from the ligament of Treitz to the peritoneal reflection of the mid rectum.  The patient had minimal inflammatory disease.  I divided the sigmoid colon attachments to the left side of the pelvis using sharp dissection.  I elevated the sigmoid mesentery and enetered into the retro-mesenteric plane. We were able to identify the left ureter and gonadal vessels. We kept those posterior within the retroperitoneum and elevated the left colon mesentery off that. I did isolated IMA pedicle but did not ligate it yet.  I continued distally and got into the avascular plane posterior to the mesorectum.  This allowed me to help mobilize the rectum as well by freeing the mesorectum off the sacrum.  I mobilized the peritoneal coverings towards the peritoneal reflection on both the right and left sides of the rectum.  I could see the right and left ureters and stayed  away from them.    I skeletonized the inferior mesenteric artery pedicle.  I went down to its takeoff from the aorta.   After confirming the left ureter was out of the way, I went ahead and ligated the inferior mesenteric artery pedicle with bipolar robotic vessel sealer ~2cm above its takeoff from the aorta.  We ensured hemostasis.  I then terminated into the pelvic inlet.  The uterus was retracted anteriorly.  Both ovaries had adhesions to the sigmoid mesentery.  These were taken down using blunt and sharp dissection.  The rectovaginal septum was also dissected bluntly to allow for mobilization of the colon out of the pelvis.  At this point I did encounter one chocolate brown cyst which was irrigated and suctioned away.  Once this was completed, I skeletonized the mesorectum at the junction at the proximal rectum using blunt dissection & bipolar robotic vessel sealer.  I mobilized the left colon in a lateral to medial fashion off the line of Toldt up towards the splenic flexure to ensure good mobilization of the left colon to reach into the pelvis.  I divided the mesentery using the robotic vessel sealer to the level of the descending sigmoid junction.  The robot was then undocked.  I made a small Pfannenstiel incision for extraction of the specimen.  An Alexis wound protector was placed.  The specimen was brought out through this incision and a pursestring device was used to divide the descending sigmoid junction.  A 2-0 Prolene pursestring was placed.  A 33 mm EEA anvil was then placed into the colon and the pursestring was tied tightly around this.  The remaining colon was then placed back into the abdomen.  The specimen was sent to pathology for further examination.  The abdomen was reinsufflated.  An anastomosis was created under direct laparoscopic visualization.  There was no tension.  There was no leak when tested with insufflation under water.  The anastomosis rests partly 14 cm from anal verge.  The  pelvis was irrigated.  Hemostasis was good.  The omentum was then brought back down over the small bowel.  The 12 mm port site was closed laparoscopically using a 0 Vicryl interrupted suture and laparoscopic suture passer .  We removed the remaining ports and the Alexis and switch to clean gowns, gloves and drapes and instruments.  The peritoneum was closed using a running 2-0 Vicryl suture.  The fascia of the Pfannenstiel incision was closed using 0 Novafil sutures.  The subcutaneous tissue was reapproximated with a 2-0 Vicryl running suture.  The skin was closed using a running 4-0 Vicryl subcuticular suture.  A sterile dressing was placed over this.  The remaining port sites were closed using 4-0 Vicryl and Dermabond.  The patient was then awakened from anesthesia and sent to the postanesthesia care unit in stable condition.  All counts were correct per operating room staff.

## 2017-02-11 LAB — CBC
HCT: 35.8 % — ABNORMAL LOW (ref 36.0–46.0)
Hemoglobin: 12.5 g/dL (ref 12.0–15.0)
MCH: 29.8 pg (ref 26.0–34.0)
MCHC: 34.9 g/dL (ref 30.0–36.0)
MCV: 85.4 fL (ref 78.0–100.0)
Platelets: 272 10*3/uL (ref 150–400)
RBC: 4.19 MIL/uL (ref 3.87–5.11)
RDW: 12.5 % (ref 11.5–15.5)
WBC: 17.5 10*3/uL — ABNORMAL HIGH (ref 4.0–10.5)

## 2017-02-11 LAB — BASIC METABOLIC PANEL
Anion gap: 5 (ref 5–15)
BUN: 10 mg/dL (ref 6–20)
CO2: 26 mmol/L (ref 22–32)
Calcium: 8.5 mg/dL — ABNORMAL LOW (ref 8.9–10.3)
Chloride: 107 mmol/L (ref 101–111)
Creatinine, Ser: 0.76 mg/dL (ref 0.44–1.00)
GFR calc Af Amer: >60 mL/min (ref >60)
GFR calc non Af Amer: >60 mL/min (ref >60)
Glucose, Bld: 123 mg/dL — ABNORMAL HIGH (ref 65–99)
Potassium: 4.1 mmol/L (ref 3.5–5.1)
Sodium: 138 mmol/L (ref 135–145)

## 2017-02-11 MED ORDER — MORPHINE SULFATE (PF) 2 MG/ML IV SOLN
1.0000 mg | INTRAVENOUS | Status: DC | PRN
  Administered 2017-02-11 – 2017-02-12 (×2): 4 mg via INTRAVENOUS
  Filled 2017-02-11 (×2): qty 2

## 2017-02-11 NOTE — Progress Notes (Signed)
1 Day Post-Op Rob Sigmoidectomy Subjective: Feels sore this am.  Min nausea  Objective: Vital signs in last 24 hours: Temp:  [97.8 F (36.6 C)-98.6 F (37 C)] 98.4 F (36.9 C) (08/02 0538) Pulse Rate:  [61-104] 81 (08/02 0538) Resp:  [12-23] 16 (08/02 0538) BP: (118-149)/(59-88) 121/79 (08/02 0538) SpO2:  [98 %-100 %] 100 % (08/02 0538)   Intake/Output from previous day: 08/01 0701 - 08/02 0700 In: 2718.8 [P.O.:360; I.V.:2308.8; IV Piggyback:50] Out: 1550 [Urine:1525; Blood:25] Intake/Output this shift: No intake/output data recorded.   General appearance: alert and cooperative GI: normal findings: soft, non-distended  Incision: no significant drainage  Lab Results:   Recent Labs  02/11/17 0525  WBC 17.5*  HGB 12.5  HCT 35.8*  PLT 272   BMET  Recent Labs  02/11/17 0525  NA 138  K 4.1  CL 107  CO2 26  GLUCOSE 123*  BUN 10  CREATININE 0.76  CALCIUM 8.5*   PT/INR No results for input(s): LABPROT, INR in the last 72 hours. ABG No results for input(s): PHART, HCO3 in the last 72 hours.  Invalid input(s): PCO2, PO2  MEDS, Scheduled . acetaminophen  1,000 mg Oral Q6H  . alvimopan  12 mg Oral BID  . celecoxib  200 mg Oral BID  . clonazePAM  0.25 mg Oral QHS  . enoxaparin (LOVENOX) injection  40 mg Subcutaneous Q24H  . pantoprazole  20 mg Oral Daily  . propranolol ER  60 mg Oral Daily  . saccharomyces boulardii  250 mg Oral BID    Studies/Results: No results found.  Assessment: s/p Procedure(s): XI ROBOT ASSISTED SIGMOIDECTOMY Patient Active Problem List   Diagnosis Date Noted  . Diverticular disease 02/10/2017    Expected post op course  Plan: d/c foley Advance diet to fulls and soft foods as tolerated Ambulate Decrease MIV    LOS: 1 day     .Vanita PandaAlicia C Damyen Knoll, MD [facility], [place] [phone]   02/11/2017 7:51 AM

## 2017-02-11 NOTE — Progress Notes (Signed)
Pt states scheduled Tylenol and prn morphine and Levsin aren't lasting. Paged Dr. Carolynne Edouardoth.   Dr. Carolynne Edouardoth returned call. Reviewed pt status and meds. VO to change Morphine order to 1-4 mg IV every hour prn for pain. Will place order and notify pt of plan.

## 2017-02-11 NOTE — Progress Notes (Addendum)
Pt reports lower abdominal cramping and bloody stool. Assessed stool  and abdomen. Paged PA covering for Dr. Maisie Fushomas. Ordered hyoscyamine from pharmacy. Will continue to monitor pt.  Received call back from Will, PA. Discussed above and pt status. VO for Q4 BP through the night and labs in morning.   Will relay plan to NT and pt and continue to monitor pt.

## 2017-02-11 NOTE — Progress Notes (Signed)
CAlled pt having some cramps and some blood with stool earlier.  BP stable.  H/H stable this AM.  Will check BP q4h and recheck CBC in AM.  Call for drop in BP.

## 2017-02-11 NOTE — Consult Note (Signed)
Stopped by to see patient post op and tell her the WOC team will discharge her from our service.  Pt in excellent spirits. We will not follow, but will remain available to this patient, to nursing, and the medical and/or surgical teams. Please re-consult if we need to assist further.   Barnett HatterMelinda Hope Holst, RN-C, WTA-C, [facility] Wound Treatment Associate

## 2017-02-12 LAB — BASIC METABOLIC PANEL
Anion gap: 7 (ref 5–15)
BUN: 12 mg/dL (ref 6–20)
CO2: 25 mmol/L (ref 22–32)
Calcium: 8.4 mg/dL — ABNORMAL LOW (ref 8.9–10.3)
Chloride: 105 mmol/L (ref 101–111)
Creatinine, Ser: 0.76 mg/dL (ref 0.44–1.00)
GFR calc Af Amer: >60 mL/min (ref >60)
GFR calc non Af Amer: >60 mL/min (ref >60)
Glucose, Bld: 91 mg/dL (ref 65–99)
Potassium: 4 mmol/L (ref 3.5–5.1)
Sodium: 137 mmol/L (ref 135–145)

## 2017-02-12 LAB — CBC
HCT: 35.2 % — ABNORMAL LOW (ref 36.0–46.0)
Hemoglobin: 12 g/dL (ref 12.0–15.0)
MCH: 29.6 pg (ref 26.0–34.0)
MCHC: 34.1 g/dL (ref 30.0–36.0)
MCV: 86.9 fL (ref 78.0–100.0)
Platelets: 243 10*3/uL (ref 150–400)
RBC: 4.05 MIL/uL (ref 3.87–5.11)
RDW: 13.1 % (ref 11.5–15.5)
WBC: 10.4 10*3/uL (ref 4.0–10.5)

## 2017-02-12 MED ORDER — OXYCODONE HCL 5 MG PO TABS
5.0000 mg | ORAL_TABLET | Freq: Four times a day (QID) | ORAL | Status: DC | PRN
  Administered 2017-02-12 – 2017-02-13 (×4): 5 mg via ORAL
  Filled 2017-02-12 (×5): qty 1

## 2017-02-12 MED ORDER — HYDROCODONE-ACETAMINOPHEN 5-325 MG PO TABS
1.0000 | ORAL_TABLET | ORAL | Status: DC | PRN
  Administered 2017-02-12 – 2017-02-13 (×4): 1 via ORAL
  Administered 2017-02-13 – 2017-02-14 (×3): 2 via ORAL
  Filled 2017-02-12 (×2): qty 1
  Filled 2017-02-12: qty 2
  Filled 2017-02-12: qty 1
  Filled 2017-02-12: qty 2
  Filled 2017-02-12: qty 1
  Filled 2017-02-12: qty 2

## 2017-02-12 NOTE — Progress Notes (Signed)
2 Days Post-Op Rob Sigmoidectomy Subjective: Feels more sore this am.  Min nausea.  Had a bloody BM yesterday  Objective: Vital signs in last 24 hours: Temp:  [97.7 F (36.5 C)-98.7 F (37.1 C)] 97.8 F (36.6 C) (08/03 0557) Pulse Rate:  [56-68] 68 (08/03 0557) Resp:  [16-17] 16 (08/03 0557) BP: (113-131)/(67-81) 125/67 (08/03 0557) SpO2:  [100 %] 100 % (08/03 0557)   Intake/Output from previous day: 08/02 0701 - 08/03 0700 In: 2374.7 [P.O.:480; I.V.:1894.7] Out: 1000 [Urine:1000] Intake/Output this shift: No intake/output data recorded.   General appearance: alert and cooperative GI: normal findings: soft, non-distended  Incision: no significant drainage  Lab Results:   Recent Labs  02/11/17 0525 02/12/17 0530  WBC 17.5* 10.4  HGB 12.5 12.0  HCT 35.8* 35.2*  PLT 272 243   BMET  Recent Labs  02/11/17 0525 02/12/17 0530  NA 138 137  K 4.1 4.0  CL 107 105  CO2 26 25  GLUCOSE 123* 91  BUN 10 12  CREATININE 0.76 0.76  CALCIUM 8.5* 8.4*   PT/INR No results for input(s): LABPROT, INR in the last 72 hours. ABG No results for input(s): PHART, HCO3 in the last 72 hours.  Invalid input(s): PCO2, PO2  MEDS, Scheduled . celecoxib  200 mg Oral BID  . clonazePAM  0.25 mg Oral QHS  . enoxaparin (LOVENOX) injection  40 mg Subcutaneous Q24H  . pantoprazole  20 mg Oral Daily  . propranolol ER  60 mg Oral Daily  . saccharomyces boulardii  250 mg Oral BID    Studies/Results: No results found.  Assessment: s/p Procedure(s): XI ROBOT ASSISTED SIGMOIDECTOMY Patient Active Problem List   Diagnosis Date Noted  . Diverticular disease 02/10/2017    Expected post op course  Plan: Advance diet to soft foods  Ambulate SL MIV Vicodin for pain, oxycodone for breakthrough Hopefully home tomorrow    LOS: 2 days     .Vanita PandaAlicia C Dakotah Heiman, MD [facility], [place] [phone]   02/12/2017 7:55 AM

## 2017-02-12 NOTE — Discharge Instructions (Signed)

## 2017-02-13 LAB — CBC
HCT: 34.5 % — ABNORMAL LOW (ref 36.0–46.0)
Hemoglobin: 12 g/dL (ref 12.0–15.0)
MCH: 30 pg (ref 26.0–34.0)
MCHC: 34.8 g/dL (ref 30.0–36.0)
MCV: 86.3 fL (ref 78.0–100.0)
Platelets: 234 10*3/uL (ref 150–400)
RBC: 4 MIL/uL (ref 3.87–5.11)
RDW: 12.8 % (ref 11.5–15.5)
WBC: 9.2 10*3/uL (ref 4.0–10.5)

## 2017-02-13 LAB — BASIC METABOLIC PANEL
Anion gap: 6 (ref 5–15)
BUN: 12 mg/dL (ref 6–20)
CO2: 28 mmol/L (ref 22–32)
Calcium: 8.5 mg/dL — ABNORMAL LOW (ref 8.9–10.3)
Chloride: 107 mmol/L (ref 101–111)
Creatinine, Ser: 0.77 mg/dL (ref 0.44–1.00)
GFR calc Af Amer: >60 mL/min (ref >60)
GFR calc non Af Amer: >60 mL/min (ref >60)
Glucose, Bld: 84 mg/dL (ref 65–99)
Potassium: 4.5 mmol/L (ref 3.5–5.1)
Sodium: 141 mmol/L (ref 135–145)

## 2017-02-14 MED ORDER — ONDANSETRON HCL 4 MG PO TABS: 4.0000 mg | ORAL_TABLET | Freq: Four times a day (QID) | ORAL | 0 refills | Status: DC | PRN

## 2017-02-14 MED ORDER — HYDROCODONE-ACETAMINOPHEN 5-325 MG PO TABS: 1.0000 | ORAL_TABLET | Freq: Four times a day (QID) | ORAL | 0 refills | Status: DC | PRN

## 2017-02-14 NOTE — Discharge Summary (Signed)
Physician Discharge Summary  Patient ID:  [name] [name]  MRN: [id number]  DOB/AGE: 01-01-1986 30 y.o.  Admit date: 02/10/2017 Discharge date: 02/14/2017  Discharge Diagnoses:  1.  Endometriosis of sigmoid colon 2.  Obesity   Active Problems:   Diverticular disease  Operation: Procedure(s): XI ROBOT ASSISTED SIGMOIDECTOMY on 02/10/2017 - Dr. Maisie Fushomas  Discharged Condition: good  Hospital Course: [name] [name] is an 31 y.o. female whose primary care physician is Joycelyn RuaMeyers, Stephen, MD and who was admitted 02/10/2017 with a chief complaint of recurrent diverticulitis.   She was brought to the operating room on 02/10/2017 and underwent  Robot assisted sigmoid colectomy.  She has done well from surgery.  She has had some nausea. But she is afebrile, her pain is controlled, and she is ready to go home.   The discharge instructions were reviewed with the patient.  Consults: None  Significant Diagnostic Studies: Results for orders placed or performed during the hospital encounter of 02/10/17  Basic metabolic panel  Result Value Ref Range   Sodium 138 135 - 145 mmol/L   Potassium 4.1 3.5 - 5.1 mmol/L   Chloride 107 101 - 111 mmol/L   CO2 26 22 - 32 mmol/L   Glucose, Bld 123 (H) 65 - 99 mg/dL   BUN 10 6 - 20 mg/dL   Creatinine, Ser 9.560.76 0.44 - 1.00 mg/dL   Calcium 8.5 (L) 8.9 - 10.3 mg/dL   GFR calc non Af Amer >60 >60 mL/min   GFR calc Af Amer >60 >60 mL/min   Anion gap 5 5 - 15  CBC  Result Value Ref Range   WBC 17.5 (H) 4.0 - 10.5 K/uL   RBC 4.19 3.87 - 5.11 MIL/uL   Hemoglobin 12.5 12.0 - 15.0 g/dL   HCT 38.735.8 (L) 56.436.0 - 33.246.0 %   MCV 85.4 78.0 - 100.0 fL   MCH 29.8 26.0 - 34.0 pg   MCHC 34.9 30.0 - 36.0 g/dL   RDW 95.112.5 88.411.5 - 16.615.5 %   Platelets 272 150 - 400 K/uL  Basic metabolic panel  Result Value Ref Range   Sodium 137 135 - 145 mmol/L   Potassium 4.0 3.5 - 5.1 mmol/L   Chloride 105 101 - 111 mmol/L   CO2 25 22 - 32 mmol/L   Glucose, Bld 91 65 - 99 mg/dL   BUN 12 6 -  20 mg/dL   Creatinine, Ser 0.630.76 0.44 - 1.00 mg/dL   Calcium 8.4 (L) 8.9 - 10.3 mg/dL   GFR calc non Af Amer >60 >60 mL/min   GFR calc Af Amer >60 >60 mL/min   Anion gap 7 5 - 15  CBC  Result Value Ref Range   WBC 10.4 4.0 - 10.5 K/uL   RBC 4.05 3.87 - 5.11 MIL/uL   Hemoglobin 12.0 12.0 - 15.0 g/dL   HCT 01.635.2 (L) 01.036.0 - 93.246.0 %   MCV 86.9 78.0 - 100.0 fL   MCH 29.6 26.0 - 34.0 pg   MCHC 34.1 30.0 - 36.0 g/dL   RDW 35.513.1 73.211.5 - 20.215.5 %   Platelets 243 150 - 400 K/uL  Basic metabolic panel  Result Value Ref Range   Sodium 141 135 - 145 mmol/L   Potassium 4.5 3.5 - 5.1 mmol/L   Chloride 107 101 - 111 mmol/L   CO2 28 22 - 32 mmol/L   Glucose, Bld 84 65 - 99 mg/dL   BUN 12 6 - 20 mg/dL   Creatinine, Ser 5.420.77  0.44 - 1.00 mg/dL   Calcium 8.5 (L) 8.9 - 10.3 mg/dL   GFR calc non Af Amer >60 >60 mL/min   GFR calc Af Amer >60 >60 mL/min   Anion gap 6 5 - 15  CBC  Result Value Ref Range   WBC 9.2 4.0 - 10.5 K/uL   RBC 4.00 3.87 - 5.11 MIL/uL   Hemoglobin 12.0 12.0 - 15.0 g/dL   HCT 78.2 (L) 95.6 - 21.3 %   MCV 86.3 78.0 - 100.0 fL   MCH 30.0 26.0 - 34.0 pg   MCHC 34.8 30.0 - 36.0 g/dL   RDW 08.6 57.8 - 46.9 %   Platelets 234 150 - 400 K/uL    No results found.  Discharge Exam:  Vitals:   02/13/17 2120 02/14/17 0620  BP: 135/71 120/68  Pulse: 71 75  Resp: 18 18  Temp: 98.5 F (36.9 C) 98.5 F (36.9 C)    General: WN obese WF who is alert and generally healthy appearing.  Lungs: Clear to auscultation and symmetric breath sounds. Heart:  RRR. No murmur or rub. Abdomen: Soft. No mass. No hernia. Has bowel sounds.        Incisions look good.  Discharge Medications:   Allergies as of 02/14/2017      Reactions   Tylenol With Codeine #3 [acetaminophen-codeine] Palpitations   Betadine [povidone Iodine] Rash      Medication List    TAKE these medications   acetaminophen 500 MG tablet Commonly known as:  TYLENOL Take 1,000 mg by mouth every 6 (six) hours as needed for  moderate pain.   HYDROcodone-acetaminophen 5-325 MG tablet Commonly known as:  NORCO/VICODIN Take 1-2 tablets by mouth every 6 (six) hours as needed for moderate pain or severe pain.   hyoscyamine 0.125 MG Tbdp disintergrating tablet Commonly known as:  ANASPAZ Place 0.125 mg under the tongue every 4 (four) hours as needed.   KLONOPIN 0.5 MG tablet Generic drug:  clonazePAM Take 0.25 mg by mouth at bedtime as needed.   levalbuterol 45 MCG/ACT inhaler Commonly known as:  XOPENEX HFA Inhale 2 puffs into the lungs every 4 (four) hours as needed for wheezing.   metoCLOPramide 10 MG tablet Commonly known as:  REGLAN Take 1 tablet (10 mg total) by mouth every 6 (six) hours as needed for nausea or vomiting.   naproxen 500 MG tablet Commonly known as:  NAPROSYN Take 1 tablet (500 mg total) by mouth 2 (two) times daily. What changed:  when to take this  reasons to take this   ondansetron 4 MG tablet Commonly known as:  ZOFRAN Take 1 tablet (4 mg total) by mouth every 6 (six) hours as needed for nausea.   pantoprazole 20 MG tablet Commonly known as:  PROTONIX Take 1 tablet (20 mg total) by mouth daily.   promethazine 25 MG tablet Commonly known as:  PHENERGAN Take 25 mg by mouth every 6 (six) hours as needed for nausea or vomiting.   propranolol ER 60 MG 24 hr capsule Commonly known as:  INDERAL LA Take 1 capsule by mouth daily.   SUMAtriptan 100 MG tablet Commonly known as:  IMITREX Take 100 mg by mouth every 2 (two) hours as needed for migraine. May repeat in 2 hours if headache persists or recurs.   traMADol 50 MG tablet Commonly known as:  ULTRAM Take 50 mg by mouth every 6 (six) hours as needed.       Disposition: 01-Home or Self Care  Discharge Instructions    Diet - low sodium heart healthy    Complete by:  As directed    Increase activity slowly    Complete by:  As directed       Follow-up Information    Romie Leveehomas, Alicia, MD. Schedule an appointment as  soon as possible for a visit in 2 week(s).   Specialty:  General Surgery Contact information: [address] [place] [id number] [phone]            Signed: Ovidio Kinavid Clotile Whittington, M.D., [facility] Office:  [phone]  02/14/2017, 8:18 AM

## 2017-02-14 NOTE — Progress Notes (Signed)
Pt was discharged home today. Instructions were reviewed with patient, and questions were answered. Pt was taken to main entrance via wheelchair by NT.  

## 2017-02-15 MED FILL — ONDANSETRON HCL 4 MG TABLET: 4 | 3 days supply | Qty: 15 | Fill #0

## 2017-02-16 MED FILL — oxyCODONE HCL 5 MG TABS: 5 | 7 days supply | Qty: 20 | Fill #0

## 2017-02-22 ENCOUNTER — Other Ambulatory Visit: Payer: Self-pay | Admitting: *Deleted

## 2017-02-22 NOTE — Patient Outreach (Signed)
[facility]) Care Management  02/22/2017  Charlyne PetrinKristy L Lashley 1986/03/25 [id number]   Subjective: Telephone call to patient's home  / mobile number, no answer, left HIPAA compliant voicemail message, and requested call back.   Objective: Per chart review, patient hospitalized on 02/10/17 - 02/14/17  for XI ROBOT ASSISTED PARTIAL COLECTOMY.  Patient has a history of colitis, diverticulitis, migraine, and asthma.    Assessment: Received [facility] Preoperative Call follow up referral on 01/26/17.  Preoperative call completed on 01/29/17, and transition of care follow up pending patient contact.    Plan: RNCM will call patient for 2nd telephone outreach attempt, transition of care follow up, within 10 business days if no return call.    Venora Kautzman H. Gardiner Barefootooper RN, BSN, CCM [facility] Care [facility] Telephonic CM Phone: [phone] Fax: [phone]

## 2017-02-23 ENCOUNTER — Other Ambulatory Visit: Payer: Self-pay | Admitting: *Deleted

## 2017-02-23 ENCOUNTER — Encounter: Payer: Self-pay | Admitting: *Deleted

## 2017-02-23 MED FILL — traMADol HCL 50 MG TABS: 50 | 5 days supply | Qty: 20 | Fill #0

## 2017-02-23 NOTE — Patient Outreach (Addendum)
[facility]) Care Management  02/23/2017  [name] PetrinKristy L [name] 12/21/1985 [id number]   Subjective: Received voicemail message from patient, states returning call, and requested call back. Telephone call to patient's home / mobile number, spoke with patient, and HIPAA verified.   Discussed [facility] Transition of care follow up, patient voiced understanding, and is in agreement to follow up.  States she remember speaking with this RNCM in the past. Patient states she is doing alright, eating some solid food as tolerated, pain medicine changed today, and will start new medicine later this afternoon once received from the pharmacy.   States the pain is more than anticipated due to the new endometriosis diagnosis, and the multiple adhesions. RNCM educated patient on seeking education on endometriosis, discussing new diagnosis with  OB/GYN MD, constipation management,  and pain management strategies.  Patient voiced understanding of education, is in agreement, and states she will follow up.  States she has a follow up appointment with surgeon on 03/09/17.   [facility] benefits discussed on 01/29/17 preoperative call and patient states no additional questions at this time. Patient states she does not have any education material, transition of care, care coordination, disease management, disease monitoring, transportation, community resource, or pharmacy needs at this time. States she is very appreciative of the follow up and is in agreement to receive [facility] information.    Objective: Per chart review, patient scheduled to be admitted 02/10/17 for XI ROBOT ASSISTED PARTIAL COLECTOMY.  Patient has a history of colitis, diverticulitis, migraine, and asthma.    Assessment: Received [facility] Preoperative Call follow up referral on 01/26/17.  Preoperative call completed, and transition of care follow up completed, and will proceed with case closure.    Plan: RNCM will send  patient successful outreach letter, [facility] pamphlet, and magnet. RNCM will send case closure due to follow up completed / no care management needs request to Iverson AlaminLaura Greeson at [facility].     Kassius Battiste H. Gardiner Barefootooper RN, BSN, CCM [facility] Care Management [facility] Telephonic CM Phone: [phone] Fax: [phone]

## 2017-03-09 MED FILL — PANTOPRAZOLE SOD DR 20 MG T: 20 | 30 days supply | Qty: 30 | Fill #2

## 2017-03-09 MED FILL — PROPRANOLOL ER 60 MG CAP: 60 | 30 days supply | Qty: 30 | Fill #1

## 2017-03-16 MED FILL — clonazePAM 0.5 MG TABS: 0.5 | 30 days supply | Qty: 30 | Fill #0

## 2017-03-17 MED FILL — RIZATRIPTAN 10 MG ODT: 10 | 15 days supply | Qty: 9 | Fill #1

## 2017-03-18 MED FILL — ZOLPIDEM TARTRATE 5 MG TAB: 5 | 20 days supply | Qty: 20 | Fill #0

## 2017-03-31 DIAGNOSIS — R102 Pelvic and perineal pain: Secondary | ICD-10-CM | POA: Diagnosis not present

## 2017-03-31 DIAGNOSIS — R350 Frequency of micturition: Secondary | ICD-10-CM | POA: Diagnosis not present

## 2017-03-31 DIAGNOSIS — N803 Endometriosis of pelvic peritoneum: Secondary | ICD-10-CM | POA: Diagnosis not present

## 2017-03-31 DIAGNOSIS — Z3040 Encounter for surveillance of contraceptives, unspecified: Secondary | ICD-10-CM | POA: Diagnosis not present

## 2017-03-31 MED FILL — NAPROXEN 500 MG TABLET: 500 | 23 days supply | Qty: 45 | Fill #0

## 2017-04-07 MED FILL — PROPRANOLOL ER 60 MG CAP: 60 | 30 days supply | Qty: 30 | Fill #2

## 2017-04-08 MED FILL — PANTOPRAZOLE SOD DR 20 MG T: 20 | 30 days supply | Qty: 30 | Fill #3

## 2017-04-12 DIAGNOSIS — R109 Unspecified abdominal pain: Secondary | ICD-10-CM | POA: Diagnosis not present

## 2017-04-12 MED FILL — CYCLOBENZAPRINE 5 MG TABLET: 5 | 15 days supply | Qty: 45 | Fill #0

## 2017-04-14 MED FILL — clonazePAM 0.5 MG TABS: 0.5 | 30 days supply | Qty: 30 | Fill #1

## 2017-04-16 MED FILL — LO LOESTRIN FE 1-10 TABLET: 1 MG-10 MCG | 84 days supply | Qty: 84 | Fill #0

## 2017-05-16 MED FILL — CYCLOBENZAPRINE 5 MG TABLET: 5 | 15 days supply | Qty: 45 | Fill #1

## 2017-05-16 MED FILL — clonazePAM 0.5 MG TABS: 0.5 | 30 days supply | Qty: 30 | Fill #2

## 2017-05-16 MED FILL — PANTOPRAZOLE SOD DR 20 MG T: 20 | 30 days supply | Qty: 30 | Fill #4

## 2017-05-16 MED FILL — PROPRANOLOL ER 60 MG CAP: 60 | 30 days supply | Qty: 30 | Fill #3

## 2017-06-02 DIAGNOSIS — G43909 Migraine, unspecified, not intractable, without status migrainosus: Secondary | ICD-10-CM | POA: Diagnosis not present

## 2017-06-02 DIAGNOSIS — R05 Cough: Secondary | ICD-10-CM | POA: Diagnosis not present

## 2017-06-02 DIAGNOSIS — K5790 Diverticulosis of intestine, part unspecified, without perforation or abscess without bleeding: Secondary | ICD-10-CM | POA: Diagnosis not present

## 2017-06-02 DIAGNOSIS — K580 Irritable bowel syndrome with diarrhea: Secondary | ICD-10-CM | POA: Diagnosis not present

## 2017-06-02 DIAGNOSIS — J45909 Unspecified asthma, uncomplicated: Secondary | ICD-10-CM | POA: Diagnosis not present

## 2017-06-02 DIAGNOSIS — F419 Anxiety disorder, unspecified: Secondary | ICD-10-CM | POA: Diagnosis not present

## 2017-06-02 DIAGNOSIS — J069 Acute upper respiratory infection, unspecified: Secondary | ICD-10-CM | POA: Diagnosis not present

## 2017-06-16 MED FILL — NAPROXEN 500 MG TABLET: 500 | 50 days supply | Qty: 100 | Fill #0

## 2017-06-16 MED FILL — clonazePAM 0.5 MG TABS: 0.5 | 30 days supply | Qty: 30 | Fill #3

## 2017-06-16 MED FILL — PANTOPRAZOLE SOD DR 20 MG T: 20 | 30 days supply | Qty: 30 | Fill #0

## 2017-06-17 DIAGNOSIS — H52223 Regular astigmatism, bilateral: Secondary | ICD-10-CM | POA: Diagnosis not present

## 2017-06-17 DIAGNOSIS — H5213 Myopia, bilateral: Secondary | ICD-10-CM | POA: Diagnosis not present

## 2017-06-25 MED FILL — PROPRANOLOL ER 60 MG CAP: 60 | 30 days supply | Qty: 30 | Fill #0

## 2017-06-25 MED FILL — ESCITALOPRAM 20 MG TABLET: 20 | 90 days supply | Qty: 90 | Fill #0

## 2017-07-01 DIAGNOSIS — R102 Pelvic and perineal pain: Secondary | ICD-10-CM | POA: Diagnosis not present

## 2017-07-01 DIAGNOSIS — N83201 Unspecified ovarian cyst, right side: Secondary | ICD-10-CM | POA: Diagnosis not present

## 2017-07-01 DIAGNOSIS — N803 Endometriosis of pelvic peritoneum: Secondary | ICD-10-CM | POA: Diagnosis not present

## 2017-07-01 DIAGNOSIS — N83202 Unspecified ovarian cyst, left side: Secondary | ICD-10-CM | POA: Diagnosis not present

## 2017-07-16 MED FILL — ORILISSA 150 MG TABS: 150 | 28 days supply | Qty: 28 | Fill #0

## 2017-07-31 MED FILL — NAPROXEN 500 MG TABLET: 500 | 50 days supply | Qty: 100 | Fill #1

## 2017-07-31 MED FILL — PROPRANOLOL ER 60 MG CAP: 60 | 30 days supply | Qty: 30 | Fill #1

## 2017-08-02 MED FILL — PANTOPRAZOLE SOD DR 20 MG T: 20 | 30 days supply | Qty: 30 | Fill #1

## 2017-08-02 NOTE — Patient Instructions (Addendum)
Your procedure is scheduled on:  Friday, Feb 8  [facility] at:  6:30 am  Pick up the phone at the desk and dial [phone].  Call this number if you have problems the morning of surgery: [phone].  Remember: Do NOT eat or Do NOT drink clear liquids (including water) after midnight Thursday  Take these medicines the morning of surgery with a SIP OF WATER: Ok to use xopenex inhaler.  Bring inhaler with you on day of surgery.  Do NOT wear jewelry (body piercing), metal hair clips/bobby pins, make-up, or nail polish. Do NOT wear lotions, powders, or perfumes.  You may wear deoderant. Do NOT shave for 48 hours prior to surgery. Do NOT bring valuables to the hospital. Contacts may not be worn into surgery.  Have a responsible adult drive you home and stay with you for 24 hours after your procedure.  Home with Malachi ProFiance Justin cell [phone] or Mother Fannie KneeSue cell [phone].

## 2017-08-05 ENCOUNTER — Ambulatory Visit: Payer: Self-pay | Admitting: Physician Assistant

## 2017-08-05 DIAGNOSIS — J019 Acute sinusitis, unspecified: Secondary | ICD-10-CM | POA: Diagnosis not present

## 2017-08-05 DIAGNOSIS — G43909 Migraine, unspecified, not intractable, without status migrainosus: Secondary | ICD-10-CM | POA: Diagnosis not present

## 2017-08-05 DIAGNOSIS — J069 Acute upper respiratory infection, unspecified: Secondary | ICD-10-CM | POA: Diagnosis not present

## 2017-08-06 DIAGNOSIS — N803 Endometriosis of pelvic peritoneum: Secondary | ICD-10-CM | POA: Diagnosis not present

## 2017-08-09 ENCOUNTER — Encounter (HOSPITAL_COMMUNITY): Payer: Self-pay

## 2017-08-09 ENCOUNTER — Other Ambulatory Visit: Payer: Self-pay

## 2017-08-09 ENCOUNTER — Encounter (HOSPITAL_COMMUNITY)
Admission: RE | Admit: 2017-08-09 | Discharge: 2017-08-09 | Disposition: A | Discharge status: Home / Self Care | Payer: 59 | Source: Ambulatory Visit | Attending: Obstetrics and Gynecology | Admitting: Obstetrics and Gynecology

## 2017-08-09 DIAGNOSIS — Z01812 Encounter for preprocedural laboratory examination: Secondary | ICD-10-CM | POA: Diagnosis not present

## 2017-08-09 HISTORY — DX: Unspecified osteoarthritis, unspecified site: M19.90

## 2017-08-09 HISTORY — DX: Depression, unspecified: F32.A

## 2017-08-09 HISTORY — DX: Major depressive disorder, single episode, unspecified: F32.9

## 2017-08-09 HISTORY — DX: Other seasonal allergic rhinitis: J30.2

## 2017-08-09 HISTORY — DX: Anxiety disorder, unspecified: F41.9

## 2017-08-09 LAB — COMPREHENSIVE METABOLIC PANEL
ALT: 36 U/L (ref 14–54)
AST: 25 U/L (ref 15–41)
Albumin: 4.2 g/dL (ref 3.5–5.0)
Alkaline Phosphatase: 63 U/L (ref 38–126)
Anion gap: 6 (ref 5–15)
BUN: 24 mg/dL — ABNORMAL HIGH (ref 6–20)
CO2: 22 mmol/L (ref 22–32)
Calcium: 8.9 mg/dL (ref 8.9–10.3)
Chloride: 111 mmol/L (ref 101–111)
Creatinine, Ser: 0.98 mg/dL (ref 0.44–1.00)
GFR calc Af Amer: >60 mL/min (ref >60)
GFR calc non Af Amer: >60 mL/min (ref >60)
Glucose, Bld: 103 mg/dL — ABNORMAL HIGH (ref 65–99)
Potassium: 3.8 mmol/L (ref 3.5–5.1)
Sodium: 139 mmol/L (ref 135–145)
Total Bilirubin: 0.4 mg/dL (ref 0.3–1.2)
Total Protein: 6.9 g/dL (ref 6.5–8.1)

## 2017-08-09 LAB — CBC
HCT: 37 % (ref 36.0–46.0)
Hemoglobin: 12.9 g/dL (ref 12.0–15.0)
MCH: 30.1 pg (ref 26.0–34.0)
MCHC: 34.9 g/dL (ref 30.0–36.0)
MCV: 86.2 fL (ref 78.0–100.0)
Platelets: 263 10*3/uL (ref 150–400)
RBC: 4.29 MIL/uL (ref 3.87–5.11)
RDW: 12.8 % (ref 11.5–15.5)
WBC: 9.5 10*3/uL (ref 4.0–10.5)

## 2017-08-09 LAB — TYPE AND SCREEN
ABO/RH(D): O POS
Antibody Screen: NEGATIVE

## 2017-08-09 LAB — ABO/RH: ABO/RH(D): O POS

## 2017-08-19 NOTE — H&P (Signed)
[name] PetrinKristy L [name] is an 32 y.o. female G0P0 with endometriosis diagnosed at time of sigmoidectomy for diverticulitis.  Now with persistent ovarian cyst.  D/W pt r/b/a of surgery - inc but not limited to bleeding, infection and damage to surrounding tissue. Also difficulty healing.  Also simple cyst on L ovary.    Pertinent Gynecological History: No LMP recorded. Patient is not currently having periods (Reason: IUD).   G0P0 No abn pap No STD US shows persistent paraovarian cyst and simple cyst on Left ovary IUD in proper placement  Past Medical History:  Diagnosis Date  . Anemia   . Anxiety   . Arthritis    knee  . Asthma   . Back pain    lower back  . Colitis   . Complication of anesthesia   . Depression   . Diverticulitis   . GERD (gastroesophageal reflux disease)   . History of kidney stones    passed stones-no surgery required  . Migraine   . Nerve damage    left femoral nerve  . 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 PONV (postoperative nausea and vomiting)   . Seasonal allergies   . Seizures (HCC)    febrile seizure as a child, no problems as aduilt    Past Surgical History:  Procedure Laterality Date  . BOWEL RESECTION  02/10/2017  . CHOLECYSTECTOMY    . COLONOSCOPY    . 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 KNEE SURGERY     left knee  . UPPER GI ENDOSCOPY    . WISDOM TOOTH EXTRACTION      FH: HTN, CAD, ovarian CA, breast cancer  Social History:  reports that  has never smoked. she has never used smokeless tobacco. She reports that she drinks alcohol. She reports that she does not use drugs.divorced and engaged.RN L&D  Allergies:  Allergies  Allergen Reactions  . Tylenol With Codeine #3 [Acetaminophen-Codeine] Palpitations  . Betadine [Povidone Iodine] Rash    Flexeril, lexapro, Mirena, naproxsyn, Zofran, protonix, propranolol, rizatriptan, ventolin, ambien    Review of Systems  Constitutional: Negative.    HENT: Negative.   Eyes: Negative.   Respiratory: Negative.   Cardiovascular: Negative.   Gastrointestinal: Positive for abdominal pain.  Genitourinary: Negative.   Musculoskeletal: Negative.   Skin: Negative.   Neurological: Negative.   Psychiatric/Behavioral: Negative.     There were no vitals taken for this visit. Physical Exam  Constitutional: She is oriented to person, place, and time. She appears well-developed and well-nourished.  HENT:  Head: Normocephalic and atraumatic.  Cardiovascular: Normal rate and regular rhythm.  Respiratory: Effort normal and breath sounds normal. No respiratory distress. She has no wheezes.  GI: Soft. Bowel sounds are normal. She exhibits no distension. There is no tenderness.  Musculoskeletal: Normal range of motion.  Neurological: She is alert and oriented to person, place, and time.  Skin: Skin is warm and dry.  Psychiatric: She has a normal mood and affect. Her behavior is normal.    US good IUD placement, R paraovarian cyst, L simple cyst  Assessment/Plan: 31yo G0P0 with known endometriosis for operative laparoscopy, possible RSO D/w pt r/b/a of surgery Will proceed  [name] [name] 08/19/2017, 9:28 PM

## 2017-08-19 NOTE — Anesthesia Preprocedure Evaluation (Addendum)
Anesthesia Evaluation  Patient identified by MRN, date of birth, ID band Patient awake    Reviewed: Allergy & Precautions, NPO status , Patient's Chart, lab work & pertinent test results  History of Anesthesia Complications (+) PONV  Airway Mallampati: I  TM Distance: >3 FB Neck ROM: Full    Dental  (+) Dental Advisory Given   Pulmonary asthma ,    breath sounds clear to auscultation       Cardiovascular negative cardio ROS   Rhythm:Regular Rate:Normal     Neuro/Psych  Headaches, Anxiety Depression    GI/Hepatic Neg liver ROS, GERD  Medicated and Controlled,  Endo/Other  Morbid obesity  Renal/GU negative Renal ROS     Musculoskeletal   Abdominal   Peds  Hematology negative hematology ROS (+)   Anesthesia Other Findings   Reproductive/Obstetrics                            Lab Results  Component Value Date   WBC 9.5 08/09/2017   HGB 12.9 08/09/2017   HCT 37.0 08/09/2017   MCV 86.2 08/09/2017   PLT 263 08/09/2017   Lab Results  Component Value Date   CREATININE 0.98 08/09/2017   BUN 24 (H) 08/09/2017   NA 139 08/09/2017   K 3.8 08/09/2017   CL 111 08/09/2017   CO2 22 08/09/2017    Anesthesia Physical Anesthesia Plan  ASA: II  Anesthesia Plan: General   Post-op Pain Management:    Induction: Intravenous  PONV Risk Score and Plan: 4 or greater and Scopolamine patch - Pre-op, Midazolam, Dexamethasone, Ondansetron and Treatment may vary due to age or medical condition  Airway Management Planned: Oral ETT  Additional Equipment:   Intra-op Plan:   Post-operative Plan: Extubation in OR  Informed Consent: I have reviewed the patients History and Physical, chart, labs and discussed the procedure including the risks, benefits and alternatives for the proposed anesthesia with the patient or authorized representative who has indicated his/her understanding and acceptance.    Dental advisory given  Plan Discussed with: CRNA  Anesthesia Plan Comments:        Anesthesia Quick Evaluation

## 2017-08-20 ENCOUNTER — Ambulatory Visit (HOSPITAL_COMMUNITY)
Admission: RE | Admit: 2017-08-20 | Discharge: 2017-08-20 | Disposition: A | Discharge status: Home / Self Care | Payer: 59 | Source: Ambulatory Visit | Attending: Obstetrics and Gynecology | Admitting: Obstetrics and Gynecology

## 2017-08-20 ENCOUNTER — Ambulatory Visit (HOSPITAL_COMMUNITY): Payer: 59 | Admitting: Anesthesiology

## 2017-08-20 ENCOUNTER — Encounter (HOSPITAL_COMMUNITY): Payer: Self-pay | Admitting: Emergency Medicine

## 2017-08-20 ENCOUNTER — Encounter (HOSPITAL_COMMUNITY)
Admission: RE | Disposition: A | Discharge status: Home / Self Care | Payer: Self-pay | Source: Ambulatory Visit | Attending: Obstetrics and Gynecology

## 2017-08-20 DIAGNOSIS — Z9049 Acquired absence of other specified parts of digestive tract: Secondary | ICD-10-CM | POA: Insufficient documentation

## 2017-08-20 DIAGNOSIS — M1710 Unilateral primary osteoarthritis, unspecified knee: Secondary | ICD-10-CM | POA: Insufficient documentation

## 2017-08-20 DIAGNOSIS — J45909 Unspecified asthma, uncomplicated: Secondary | ICD-10-CM | POA: Insufficient documentation

## 2017-08-20 DIAGNOSIS — Z8249 Family history of ischemic heart disease and other diseases of the circulatory system: Secondary | ICD-10-CM | POA: Diagnosis not present

## 2017-08-20 DIAGNOSIS — F329 Major depressive disorder, single episode, unspecified: Secondary | ICD-10-CM | POA: Diagnosis not present

## 2017-08-20 DIAGNOSIS — Z8041 Family history of malignant neoplasm of ovary: Secondary | ICD-10-CM | POA: Insufficient documentation

## 2017-08-20 DIAGNOSIS — Z885 Allergy status to narcotic agent status: Secondary | ICD-10-CM | POA: Insufficient documentation

## 2017-08-20 DIAGNOSIS — N803 Endometriosis of pelvic peritoneum: Secondary | ICD-10-CM | POA: Insufficient documentation

## 2017-08-20 DIAGNOSIS — N838 Other noninflammatory disorders of ovary, fallopian tube and broad ligament: Secondary | ICD-10-CM | POA: Insufficient documentation

## 2017-08-20 DIAGNOSIS — K219 Gastro-esophageal reflux disease without esophagitis: Secondary | ICD-10-CM | POA: Diagnosis not present

## 2017-08-20 DIAGNOSIS — Z888 Allergy status to other drugs, medicaments and biological substances status: Secondary | ICD-10-CM | POA: Insufficient documentation

## 2017-08-20 DIAGNOSIS — N83292 Other ovarian cyst, left side: Secondary | ICD-10-CM | POA: Insufficient documentation

## 2017-08-20 DIAGNOSIS — N83202 Unspecified ovarian cyst, left side: Secondary | ICD-10-CM | POA: Diagnosis not present

## 2017-08-20 DIAGNOSIS — Z6841 Body Mass Index (BMI) 40.0 and over, adult: Secondary | ICD-10-CM | POA: Diagnosis not present

## 2017-08-20 DIAGNOSIS — N809 Endometriosis, unspecified: Secondary | ICD-10-CM | POA: Diagnosis not present

## 2017-08-20 DIAGNOSIS — R102 Pelvic and perineal pain: Secondary | ICD-10-CM | POA: Diagnosis not present

## 2017-08-20 DIAGNOSIS — Z79899 Other long term (current) drug therapy: Secondary | ICD-10-CM | POA: Diagnosis not present

## 2017-08-20 DIAGNOSIS — Z886 Allergy status to analgesic agent status: Secondary | ICD-10-CM | POA: Diagnosis not present

## 2017-08-20 DIAGNOSIS — Z8719 Personal history of other diseases of the digestive system: Secondary | ICD-10-CM | POA: Insufficient documentation

## 2017-08-20 DIAGNOSIS — F419 Anxiety disorder, unspecified: Secondary | ICD-10-CM | POA: Insufficient documentation

## 2017-08-20 DIAGNOSIS — N736 Female pelvic peritoneal adhesions (postinfective): Secondary | ICD-10-CM | POA: Diagnosis not present

## 2017-08-20 DIAGNOSIS — N83291 Other ovarian cyst, right side: Secondary | ICD-10-CM | POA: Diagnosis not present

## 2017-08-20 HISTORY — DX: Endometriosis, unspecified: N80.9

## 2017-08-20 HISTORY — PX: LAPAROSCOPIC LYSIS OF ADHESIONS: SHX5905

## 2017-08-20 HISTORY — PX: LAPAROSCOPY: SHX197

## 2017-08-20 LAB — PREGNANCY, URINE: Preg Test, Ur: NEGATIVE

## 2017-08-20 SURGERY — LAPAROSCOPY, DIAGNOSTIC
Anesthesia: General | Site: Abdomen | Laterality: Right

## 2017-08-20 MED ORDER — BUPIVACAINE HCL (PF) 0.25 % IJ SOLN
INTRAMUSCULAR | Status: DC | PRN
  Administered 2017-08-20: 21 mL

## 2017-08-20 MED ORDER — PROMETHAZINE HCL 25 MG/ML IJ SOLN
INTRAMUSCULAR | Status: AC
  Administered 2017-08-20: 6.25 mg via INTRAVENOUS
  Filled 2017-08-20: qty 1

## 2017-08-20 MED ORDER — SCOPOLAMINE 1 MG/3DAYS TD PT72
MEDICATED_PATCH | TRANSDERMAL | Status: AC
  Administered 2017-08-20: 1.5 mg via TRANSDERMAL
  Filled 2017-08-20: qty 1

## 2017-08-20 MED ORDER — EPHEDRINE SULFATE 50 MG/ML IJ SOLN
INTRAMUSCULAR | Status: DC | PRN
  Administered 2017-08-20: 10 mg via INTRAVENOUS
  Administered 2017-08-20 (×2): 5 mg via INTRAVENOUS

## 2017-08-20 MED ORDER — MIDAZOLAM HCL 2 MG/2ML IJ SOLN
INTRAMUSCULAR | Status: AC
  Filled 2017-08-20: qty 2

## 2017-08-20 MED ORDER — PROPOFOL 10 MG/ML IV BOLUS
INTRAVENOUS | Status: AC
  Filled 2017-08-20: qty 20

## 2017-08-20 MED ORDER — ROCURONIUM BROMIDE 100 MG/10ML IV SOLN
INTRAVENOUS | Status: AC
  Filled 2017-08-20: qty 1

## 2017-08-20 MED ORDER — SUGAMMADEX SODIUM 500 MG/5ML IV SOLN
INTRAVENOUS | Status: AC
  Filled 2017-08-20: qty 5

## 2017-08-20 MED ORDER — KETOROLAC TROMETHAMINE 30 MG/ML IJ SOLN
INTRAMUSCULAR | Status: DC | PRN
  Administered 2017-08-20: 30 mg via INTRAVENOUS

## 2017-08-20 MED ORDER — PROMETHAZINE HCL 25 MG/ML IJ SOLN
6.2500 mg | INTRAMUSCULAR | Status: DC | PRN
  Administered 2017-08-20: 6.25 mg via INTRAVENOUS

## 2017-08-20 MED ORDER — HYDROMORPHONE HCL 1 MG/ML IJ SOLN
INTRAMUSCULAR | Status: AC
  Filled 2017-08-20: qty 1

## 2017-08-20 MED ORDER — SODIUM CHLORIDE 0.9 % IR SOLN
Status: DC | PRN
  Administered 2017-08-20: 3000 mL

## 2017-08-20 MED ORDER — NAPROXEN 500 MG PO TABS: 500.0000 mg | ORAL_TABLET | Freq: Two times a day (BID) | ORAL | 1 refills | Status: DC | PRN

## 2017-08-20 MED ORDER — PROPOFOL 10 MG/ML IV BOLUS
INTRAVENOUS | Status: DC | PRN
  Administered 2017-08-20: 200 mg via INTRAVENOUS

## 2017-08-20 MED ORDER — HYDROMORPHONE HCL 1 MG/ML IJ SOLN
0.2500 mg | INTRAMUSCULAR | Status: DC | PRN
  Administered 2017-08-20 (×4): 0.5 mg via INTRAVENOUS

## 2017-08-20 MED ORDER — SODIUM CHLORIDE 0.9 % IJ SOLN
INTRAMUSCULAR | Status: AC
  Filled 2017-08-20: qty 10

## 2017-08-20 MED ORDER — LACTATED RINGERS IV SOLN
INTRAVENOUS | Status: DC
  Administered 2017-08-20 (×3): via INTRAVENOUS

## 2017-08-20 MED ORDER — SCOPOLAMINE 1 MG/3DAYS TD PT72
1.0000 | MEDICATED_PATCH | Freq: Once | TRANSDERMAL | Status: DC
  Administered 2017-08-20: 1.5 mg via TRANSDERMAL

## 2017-08-20 MED ORDER — KETOROLAC TROMETHAMINE 30 MG/ML IJ SOLN: 30.0000 mg | Freq: Once | INTRAMUSCULAR | Status: DC | PRN

## 2017-08-20 MED ORDER — LIDOCAINE HCL (CARDIAC) 20 MG/ML IV SOLN
INTRAVENOUS | Status: DC | PRN
  Administered 2017-08-20: 100 mg via INTRAVENOUS

## 2017-08-20 MED ORDER — ROCURONIUM BROMIDE 100 MG/10ML IV SOLN
INTRAVENOUS | Status: DC | PRN
  Administered 2017-08-20 (×3): 10 mg via INTRAVENOUS
  Administered 2017-08-20: 60 mg via INTRAVENOUS

## 2017-08-20 MED ORDER — DEXAMETHASONE SODIUM PHOSPHATE 10 MG/ML IJ SOLN
INTRAMUSCULAR | Status: AC
  Filled 2017-08-20: qty 1

## 2017-08-20 MED ORDER — SUGAMMADEX SODIUM 200 MG/2ML IV SOLN
INTRAVENOUS | Status: DC | PRN
  Administered 2017-08-20: 210 mg via INTRAVENOUS

## 2017-08-20 MED ORDER — ONDANSETRON HCL 4 MG/2ML IJ SOLN
INTRAMUSCULAR | Status: AC
  Filled 2017-08-20: qty 2

## 2017-08-20 MED ORDER — ONDANSETRON HCL 4 MG/2ML IJ SOLN
INTRAMUSCULAR | Status: DC | PRN
  Administered 2017-08-20: 4 mg via INTRAVENOUS

## 2017-08-20 MED ORDER — BUPIVACAINE HCL (PF) 0.25 % IJ SOLN
INTRAMUSCULAR | Status: AC
  Filled 2017-08-20: qty 30

## 2017-08-20 MED ORDER — HYDROMORPHONE HCL 1 MG/ML IJ SOLN
INTRAMUSCULAR | Status: DC | PRN
  Administered 2017-08-20: 1 mg via INTRAVENOUS

## 2017-08-20 MED ORDER — HYDROMORPHONE HCL 1 MG/ML IJ SOLN
INTRAMUSCULAR | Status: AC
  Administered 2017-08-20: 0.5 mg via INTRAVENOUS
  Filled 2017-08-20: qty 1

## 2017-08-20 MED ORDER — FENTANYL CITRATE (PF) 250 MCG/5ML IJ SOLN
INTRAMUSCULAR | Status: AC
  Filled 2017-08-20: qty 5

## 2017-08-20 MED ORDER — MIDAZOLAM HCL 5 MG/5ML IJ SOLN
INTRAMUSCULAR | Status: DC | PRN
  Administered 2017-08-20: 2 mg via INTRAVENOUS

## 2017-08-20 MED ORDER — DEXAMETHASONE SODIUM PHOSPHATE 10 MG/ML IJ SOLN
INTRAMUSCULAR | Status: DC | PRN
  Administered 2017-08-20: 10 mg via INTRAVENOUS

## 2017-08-20 MED ORDER — OXYCODONE HCL 5 MG PO TABS: 5.0000 mg | ORAL_TABLET | Freq: Four times a day (QID) | ORAL | 0 refills | Status: DC | PRN

## 2017-08-20 MED ORDER — FENTANYL CITRATE (PF) 100 MCG/2ML IJ SOLN
INTRAMUSCULAR | Status: DC | PRN
  Administered 2017-08-20 (×2): 50 ug via INTRAVENOUS
  Administered 2017-08-20: 150 ug via INTRAVENOUS

## 2017-08-20 MED ORDER — EPHEDRINE 5 MG/ML INJ
INTRAVENOUS | Status: AC
  Filled 2017-08-20: qty 10

## 2017-08-20 MED ORDER — LIDOCAINE HCL (CARDIAC) 20 MG/ML IV SOLN
INTRAVENOUS | Status: AC
  Filled 2017-08-20: qty 5

## 2017-08-20 MED ORDER — KETOROLAC TROMETHAMINE 30 MG/ML IJ SOLN
INTRAMUSCULAR | Status: AC
  Filled 2017-08-20: qty 1

## 2017-08-20 MED FILL — oxyCODONE HCL 5 MG TABS: 5 | 5 days supply | Qty: 20 | Fill #0

## 2017-08-20 SURGICAL SUPPLY — 32 items
BARRIER ADHS 3X4 INTERCEED (GAUZE/BANDAGES/DRESSINGS) ×4 IMPLANT
CABLE HIGH FREQUENCY MONO STRZ (ELECTRODE) IMPLANT
CHLORAPREP W/TINT 26ML (MISCELLANEOUS) ×4 IMPLANT
DERMABOND ADVANCED (GAUZE/BANDAGES/DRESSINGS) ×1
DERMABOND ADVANCED .7 DNX12 (GAUZE/BANDAGES/DRESSINGS) ×3 IMPLANT
DRSG OPSITE POSTOP 3X4 (GAUZE/BANDAGES/DRESSINGS) ×4 IMPLANT
DURAPREP 26ML APPLICATOR (WOUND CARE) IMPLANT
GLOVE BIO SURGEON STRL SZ 6.5 (GLOVE) ×4 IMPLANT
GLOVE BIOGEL PI IND STRL 7.0 (GLOVE) ×6 IMPLANT
GLOVE BIOGEL PI INDICATOR 7.0 (GLOVE) ×2
GOWN STRL REUS W/TWL LRG LVL3 (GOWN DISPOSABLE) ×8 IMPLANT
NEEDLE INSUFFLATION 120MM (ENDOMECHANICALS) ×4 IMPLANT
NS IRRIG 1000ML POUR BTL (IV SOLUTION) ×4 IMPLANT
PACK LAPAROSCOPY BASIN (CUSTOM PROCEDURE TRAY) ×4 IMPLANT
PACK TRENDGUARD 450 HYBRID PRO (MISCELLANEOUS) ×3 IMPLANT
PACK TRENDGUARD 600 HYBRD PROC (MISCELLANEOUS) IMPLANT
POUCH SPECIMEN RETRIEVAL 10MM (ENDOMECHANICALS) IMPLANT
PROTECTOR NERVE ULNAR (MISCELLANEOUS) ×8 IMPLANT
SET IRRIG TUBING LAPAROSCOPIC (IRRIGATION / IRRIGATOR) ×4 IMPLANT
SHEARS HARMONIC ACE PLUS 36CM (ENDOMECHANICALS) ×4 IMPLANT
SLEEVE XCEL OPT CAN 5 100 (ENDOMECHANICALS) ×4 IMPLANT
SUT VICRYL 0 UR6 27IN ABS (SUTURE) ×4 IMPLANT
SUT VICRYL 4-0 PS2 18IN ABS (SUTURE) ×4 IMPLANT
TOWEL OR 17X24 6PK STRL BLUE (TOWEL DISPOSABLE) ×8 IMPLANT
TRAY FOLEY BAG SILVER LF 14FR (CATHETERS) IMPLANT
TRAY FOLEY CATH SILVER 14FR (SET/KITS/TRAYS/PACK) ×4 IMPLANT
TRENDGUARD 450 HYBRID PRO PACK (MISCELLANEOUS) ×4
TRENDGUARD 600 HYBRID PROC PK (MISCELLANEOUS)
TROCAR BALLN 12MMX100 BLUNT (TROCAR) ×4 IMPLANT
TROCAR XCEL NON-BLD 11X100MML (ENDOMECHANICALS) IMPLANT
TROCAR XCEL NON-BLD 5MMX100MML (ENDOMECHANICALS) ×4 IMPLANT
WARMER LAPAROSCOPE (MISCELLANEOUS) ×4 IMPLANT

## 2017-08-20 NOTE — Transfer of Care (Signed)
Immediate Anesthesia Transfer of Care Note  Patient: [name] PetrinKristy L Lashley  Procedure(s) Performed: LAPAROSCOPY OPERATIVE, LEFT OVARIAN CYSTECTOMY, RIGHT PARATUBAL CYSTECTOMY (N/A Abdomen) EXTENSIVE LAPAROSCOPIC LYSIS OF ADHESIONS (N/A Abdomen)  Patient Location: PACU  Anesthesia Type:General  Level of Consciousness: awake and sedated  Airway & Oxygen Therapy: Patient Spontanous Breathing and Patient connected to nasal cannula oxygen  Post-op Assessment: Report given to RN and Post -op Vital signs reviewed and stable  Post vital signs: Reviewed and stable  Last Vitals:  Vitals:   08/20/17 0631  BP: 121/81  Pulse: 67  Resp: 16  Temp: 36.9 C  SpO2: 98%    Last Pain:  Vitals:   08/20/17 0631  TempSrc: Oral  PainSc: 4       Patients Stated Pain Goal: 5 (08/20/17 0631)  Complications: No apparent anesthesia complications

## 2017-08-20 NOTE — Brief Op Note (Signed)
08/20/2017  10:10 AM  PATIENT:  Charlyne PetrinKristy L Lashley  32 y.o. female  PRE-OPERATIVE DIAGNOSIS:  endometriosis of pelvis, pain in pelvis  POST-OPERATIVE DIAGNOSIS:  endometriosis of pelvis, pain in pelvis  PROCEDURE:  Procedure(s): LAPAROSCOPY OPERATIVE, LEFT OVARIAN CYSTECTOMY, RIGHT PARATUBAL CYSTECTOMY (N/A) EXTENSIVE LAPAROSCOPIC LYSIS OF ADHESIONS (N/A)  SURGEON:  Surgeon(s) and Role:    * Bovard-Stuckert, Doreen Garretson, MD - Primary  ASSISTANTS: Sumer, Tracie RNFA   ANESTHESIA:   local and general  EBL:  50 mL   BLOOD ADMINISTERED:none  DRAINS: Urinary Catheter (Foley) removed before PACU   LOCAL MEDICATIONS USED:  MARCAINE     SPECIMEN:  No Specimen  DISPOSITION OF SPECIMEN:  PATHOLOGY  COUNTS:  YES  TOURNIQUET:  * No tourniquets in log *  DICTATION: .Other Dictation: Dictation Number [id number]  PLAN OF CARE: Discharge to home after PACU  PATIENT DISPOSITION:  PACU - hemodynamically stable.   Delay start of Pharmacological VTE agent (>24hrs) due to surgical blood loss or risk of bleeding: not applicable

## 2017-08-20 NOTE — Anesthesia Postprocedure Evaluation (Signed)
Anesthesia Post Note  Patient: Charlyne PetrinKristy L Lashley  Procedure(s) Performed: LAPAROSCOPY OPERATIVE, LEFT OVARIAN CYSTECTOMY, RIGHT PARATUBAL CYSTECTOMY (N/A Abdomen) EXTENSIVE LAPAROSCOPIC LYSIS OF ADHESIONS (N/A Abdomen)     Patient location during evaluation: PACU Anesthesia Type: General Level of consciousness: awake and alert Pain management: pain level controlled Vital Signs Assessment: post-procedure vital signs reviewed and stable Respiratory status: spontaneous breathing, nonlabored ventilation, respiratory function stable and patient connected to nasal cannula oxygen Cardiovascular status: blood pressure returned to baseline and stable Postop Assessment: no apparent nausea or vomiting Anesthetic complications: no    Last Vitals:  Vitals:   08/20/17 1215 08/20/17 1230  BP: 119/69 (!) 121/58  Pulse: 64 65  Resp: 16 16  Temp:    SpO2: 96% 97%    Last Pain:  Vitals:   08/20/17 1145  TempSrc:   PainSc: 4    Pain Goal: Patients Stated Pain Goal: 5 (08/20/17 1145)               Kennieth RadFitzgerald, Mane Consolo E

## 2017-08-20 NOTE — Interval H&P Note (Signed)
History and Physical Interval Note:  08/20/2017 7:25 AM  [name] [name]  has presented today for surgery, with the diagnosis of endometriosis of pelvis, pain in pelvis  The various methods of treatment have been discussed with the patient and family. After consideration of risks, benefits and other options for treatment, the patient has consented to  Procedure(s) with comments: LAPAROSCOPY DIAGNOSTIC (N/A) - [name] to RNFA, [name] not scheduled to work this day, also just 30mins added to the time of the primary code to cover the poss procedures LAPAROSCOPIC OOPHORECTOMY (Right) FULGURATION OF LESION (N/A) as a surgical intervention .  The patient's history has been reviewed, patient examined, no change in status, stable for surgery.  I have reviewed the patient's chart and labs.  Questions were answered to the patient's satisfaction.     [name]

## 2017-08-20 NOTE — OR Nursing (Signed)
Updated Family Per Dr Ellyn HackBovard @ [address]0913am [name] [name], [place]

## 2017-08-20 NOTE — Anesthesia Procedure Notes (Signed)
Procedure Name: Intubation Date/Time: 08/20/2017 8:01 AM Performed by: Junious SilkGilbert, Lasonya Hubner, CRNA Pre-anesthesia Checklist: Patient identified, Emergency Drugs available, Suction available, Patient being monitored and Timeout performed Patient Re-evaluated:Patient Re-evaluated prior to induction Oxygen Delivery Method: Circle system utilized Preoxygenation: Pre-oxygenation with 100% oxygen Induction Type: IV induction Ventilation: Mask ventilation without difficulty Laryngoscope Size: Miller and 2 Grade View: Grade I Tube type: Oral Number of attempts: 1 Airway Equipment and Method: Stylet Placement Confirmation: ETT inserted through vocal cords under direct vision,  positive ETCO2,  CO2 detector and breath sounds checked- equal and bilateral Secured at: 21 cm Tube secured with: Tape Dental Injury: Teeth and Oropharynx as per pre-operative assessment

## 2017-08-20 NOTE — Discharge Instructions (Signed)
Laparoscopic Lysis of Abdominal Adhesions, Care After  Can take ibuprofen/ motrin/advil at 4PM Drink extra water next 48 hours Can use heating pad to upper shoulders and abd Refer to this sheet in the next few weeks. These instructions provide you with information about caring for yourself after your procedure. Your health care provider may also give you more specific instructions. Your treatment has been planned according to current medical practices, but problems sometimes occur. Call your health care provider if you have any problems or questions after your procedure. What can I expect after the procedure? After your procedure, it is common to have some pain around the incision. Follow these instructions at home:  Take medicines only as directed by your health care provider.  You may need to start by eating only a little at a time. Start with liquids. As your appetite improves, gradually return to eating solid foods.  Do not lift anything that is heavier than 10 lb (4.5 kg) for four weeks.  Return to your normal activities as directed by your health care provider. Ask your health care provider what activities are safe for you.  There are many different ways to close and cover an incision, including stitches (sutures), skin glue, and adhesive strips. Follow instructions from your health care provider about: ? Incision care. ? Bandage (dressing) changes and removal. ? Incision closure removal.  Check your incisions every day for signs of infection. Watch for: ? Redness, swelling, or pain. ? Fluid , blood, or pus. Contact a health care provider if:  You develop a fever or chills.  You have redness, swelling, or pain at the site of your incisions.  You have fluid, blood, or pus coming from your incisions.  There is a bad smell coming from your incisions or the dressing.  Your pain gets worse.  You have a cough.  You have nausea and vomiting that does not go away after 3  hours. Get help right away if:  You develop severe pain in your abdomen or your chest.  You develop shortness of breath.  You develop nausea or vomiting that is severe or keeps coming back. This information is not intended to replace advice given to you by your health care provider. Make sure you discuss any questions you have with your health care provider. Document Released: 11/13/2014 Document Revised: 12/05/2015 Document Reviewed: 06/25/2014 Elsevier Interactive Patient Education  [employer]2018 [employer]

## 2017-08-21 ENCOUNTER — Encounter (HOSPITAL_COMMUNITY): Payer: Self-pay | Admitting: Obstetrics and Gynecology

## 2017-08-23 MED FILL — HYDROCODON-APAP 5-325: 5-325 | 2 days supply | Qty: 15 | Fill #0

## 2017-08-25 NOTE — Op Note (Signed)
NAMFransisco Beau:  [name], [name]              ACCOUNT NO.:  [id number]  MEDICAL RECORD NO.:  [id number]  LOCATION:                                 FACILITY:  PHYSICIAN:  Sherron MondayJody Bovard, MD             DATE OF BIRTH:  DATE OF PROCEDURE:  08/20/2017 DATE OF DISCHARGE:  08/20/2017                              OPERATIVE REPORT   PREOPERATIVE DIAGNOSIS:  Endometriosis of pelvis, pain in the pelvis, ovarian cysts on ultrasound.  POSTOPERATIVE DIAGNOSIS:  Endometriosis of pelvis, pain in the pelvis, ovarian cysts on ultrasound.  PROCEDURE:  Operative laparoscopy, left ovarian cystectomy, right paratubal cystectomy, extensive laparoscopic lysis of adhesions.  SURGEON:  Sherron MondayJody Bovard, MD  ASSISTANT:  Georges Lynchracey Sumner, RNFA.  ESTIMATED BLOOD LOSS:  50 mL.  IV FLUIDS AND URINE OUPUT:  Per Anesthesia notes.  COMPLICATIONS:  None.  PATHOLOGY:  None.  DESCRIPTION OF PROCEDURE:  The patient presented following a sigmoidectomy for diverticulitis.  At the time of this procedure was noted to have extensive endometriosis in the pelvis.  Pathology confirming this.  Risks, benefits, and alternatives of the surgical procedure were discussed with both her and her partner prior to the procedure including, but not limited to, bleeding, infection, damage to surrounding organs, as well as trouble healing as well as inability to control pelvic pain or pressure.  On the day of surgery, she was transferred to the operating room, placed on the table in supine position with legs in Yellofin stirrups.  General anesthesia was induced and found to be adequate.  She was then prepped and draped in the normal sterile fashion.  Using an open-sided speculum, a Hulka manipulator was placed.  Foley catheter was also placed.  Gloves and gowns were changed. Attention was turned to the abdominal portion of the case and approximately 1 cm infraumbilical incision was made.  Using open technique, the peritoneum was entered and a 10 mm  umbilical port was placed under direct visualization.  The pneumoperitoneum was then obtained.  Accessory ports were placed in the right and left under direct visualization after verification of location of vessels.  A pelvic survey was performed revealing uterus that was scarred into the pelvis with omental adhesions as well as peritoneal adhesions and ovaries adhered in the pelvis.  No other extensive scarring was noted. There were some omental peritoneal adhesions in the upper abdomen. Initially using a Harmonic Scalpel and blunt dissection, the adhesions between the right ovary and the uterus were lysed attempting to restore normal right adnexal anatomy.  The ovary was lysed from peritoneal adhesions and following lysis of these adhesions, it is much more normally mobile.  The uterosacral ligaments were also identified.  The ovary on the left was noted to be adhesed.  An ovarian cyst was removed in the attempt to lyse the adhesions surrounding this ovary.  The fallopian tube adhesions were also lysed from the peritoneum again attempting to restore normal anatomy.  This ovary was still noted to be adhesed; however, planes were unable to be identified.  Interceed was placed at the uterosacral ligaments and behind the ovarian fossa on the right.  The uterine adhesions to  the peritoneum had also been lysed restoring somewhat normal anatomy.  The patient tolerated the procedure well.  Sponge, lap, and needle counts were correct x2 per operating room staff.  Following the termination of the procedure, the pneumoperitoneum was evacuated.  The accessory ports were removed under direct visualization.  Gas was evacuated from the abdomen and the umbilical port was closed with the 0 Vicryl that had been used to outline the placement of Hasson.  Deep suture also placed, 4-0 Vicryl was placed in a subcuticular fashion at all 3 ports.  Dermabond was also applied.  The patient tolerated the procedure  well.  Sponge, lap, and needle count was correct x2.     Sherron Monday, MD     JB/MEDQ  D:  08/24/2017  T:  08/25/2017  Job:  [id number]

## 2017-09-06 MED FILL — LEVONOR-ETH ESTRAD 0.1-0.02: 0.1-20 | 84 days supply | Qty: 112 | Fill #0

## 2017-09-13 MED FILL — PANTOPRAZOLE SOD DR 20 MG T: 20 | 30 days supply | Qty: 30 | Fill #2

## 2017-09-13 MED FILL — PROPRANOLOL ER 60 MG CAP: 60 | 30 days supply | Qty: 30 | Fill #2

## 2017-09-16 MED FILL — ESCITALOPRAM 20 MG TABLET: 20 | 90 days supply | Qty: 90 | Fill #1

## 2017-09-23 ENCOUNTER — Telehealth: Payer: 59 | Admitting: Physician Assistant

## 2017-09-23 DIAGNOSIS — M549 Dorsalgia, unspecified: Secondary | ICD-10-CM

## 2017-09-23 DIAGNOSIS — J329 Chronic sinusitis, unspecified: Secondary | ICD-10-CM | POA: Diagnosis not present

## 2017-09-23 DIAGNOSIS — B9789 Other viral agents as the cause of diseases classified elsewhere: Secondary | ICD-10-CM

## 2017-09-23 MED ORDER — FLUTICASONE PROPIONATE 50 MCG/ACT NA SUSP: 2.0000 | Freq: Every day | NASAL | 0 refills | Status: AC

## 2017-09-23 NOTE — Progress Notes (Signed)
We are sorry that you are not feeling well.  Here is how we plan to help!  Based on what you have shared with me it looks like you have sinusitis.  Sinusitis is inflammation and infection in the sinus cavities of the head.  Based on your presentation I believe you most likely have Acute Viral Sinusitis.This is an infection most likely caused by a virus. There is not specific treatment for viral sinusitis other than to help you with the symptoms until the infection runs its course.  You may use an oral decongestant such as Mucinex D or if you have glaucoma or high blood pressure use plain Mucinex. Saline nasal spray help and can safely be used as often as needed for congestion, I have prescribed: Fluticasone nasal spray two sprays in each nostril once a day   For the ongoing back pain, you need to be seen in person for examination.  Some authorities believe that zinc sprays or the use of Echinacea may shorten the course of your symptoms.  Sinus infections are not as easily transmitted as other respiratory infection, however we still recommend that you avoid close contact with loved ones, especially the very young and elderly.  Remember to wash your hands thoroughly throughout the day as this is the number one way to prevent the spread of infection!  Home Care:  Only take medications as instructed by your medical team.  Do not take these medications with alcohol.  A steam or ultrasonic humidifier can help congestion.  You can place a towel over your head and breathe in the steam from hot water coming from a faucet.  Avoid close contacts especially the very young and the elderly.  Cover your mouth when you cough or sneeze.  Always remember to wash your hands.  Get Help Right Away If:  You develop worsening fever or sinus pain.  You develop a severe head ache or visual changes.  Your symptoms persist after you have completed your treatment plan.  Make sure you  Understand these  instructions.  Will watch your condition.  Will get help right away if you are not doing well or get worse.  Your e-visit answers were reviewed by a board certified advanced clinical practitioner to complete your personal care plan.  Depending on the condition, your plan could have included both over the counter or prescription medications.  If there is a problem please reply  once you have received a response from your provider.  Your safety is important to us.  If you have drug allergies check your prescription carefully.    You can use MyChart to ask questions about today's visit, request a non-urgent call back, or ask for a work or school excuse for 24 hours related to this e-Visit. If it has been greater than 24 hours you will need to follow up with your provider, or enter a new e-Visit to address those concerns.  You will get an e-mail in the next two days asking about your experience.  I hope that your e-visit has been valuable and will speed your recovery. Thank you for using e-visits.

## 2017-09-26 ENCOUNTER — Telehealth: Payer: 59 | Admitting: Family

## 2017-09-26 DIAGNOSIS — H109 Unspecified conjunctivitis: Secondary | ICD-10-CM | POA: Diagnosis not present

## 2017-09-26 DIAGNOSIS — R509 Fever, unspecified: Secondary | ICD-10-CM | POA: Diagnosis not present

## 2017-09-26 DIAGNOSIS — H5789 Other specified disorders of eye and adnexa: Secondary | ICD-10-CM | POA: Diagnosis not present

## 2017-09-26 DIAGNOSIS — J018 Other acute sinusitis: Secondary | ICD-10-CM | POA: Diagnosis not present

## 2017-09-26 DIAGNOSIS — J329 Chronic sinusitis, unspecified: Secondary | ICD-10-CM

## 2017-09-26 DIAGNOSIS — R6889 Other general symptoms and signs: Secondary | ICD-10-CM | POA: Diagnosis not present

## 2017-09-26 NOTE — Progress Notes (Signed)
E visit for Flu like symptoms   We are sorry that you are not feeling well.  Here is how we plan to help! Based on what you have shared with me it looks like you may have a respiratory virus that may be influenza.  Influenza or "the flu" is   an infection caused by a respiratory virus. The flu virus is highly contagious and persons who did not receive their yearly flu vaccination may "catch" the flu from close contact.  We have anti-viral medications to treat the viruses that cause this infection. They are not a "cure" and only shorten the course of the infection. These prescriptions are most effective when they are given within the first 2 days of "flu" symptoms. Antiviral medication are indicated if you have a high risk of complications from the flu. You should  also consider an antiviral medication if you are in close contact with someone who is at risk. These medications can help patients avoid complications from the flu  but have side effects that you should know. Possible side effects from Tamiflu or oseltamivir include nausea, vomiting, diarrhea, dizziness, headaches, eye redness, sleep problems or other respiratory symptoms. You should not take Tamiflu if you have an allergy to oseltamivir or any to the ingredients in Tamiflu.  Based upon your symptoms and potential risk factors I recommend that you follow the flu symptoms recommendation that I have listed below.  ANYONE WHO HAS FLU SYMPTOMS SHOULD: . Stay home. The flu is highly contagious and going out or to work exposes others! . Be sure to drink plenty of fluids. Water is fine as well as fruit juices, sodas and electrolyte beverages. You may want to stay away from caffeine or alcohol. If you are nauseated, try taking small sips of liquids. How do you know if you are getting enough fluid? Your urine should be a pale yellow or almost colorless. . Get rest. . Taking a steamy shower or using a humidifier may help nasal congestion and ease  sore throat pain. Using a saline nasal spray works much the same way. . Cough drops, hard candies and sore throat lozenges may ease your cough. . Line up a caregiver. Have someone check on you regularly.   GET HELP RIGHT AWAY IF: . You cannot keep down liquids or your medications. . You become short of breath . Your fell like you are going to pass out or loose consciousness. . Your symptoms persist after you have completed your treatment plan MAKE SURE YOU   Understand these instructions.  Will watch your condition.  Will get help right away if you are not doing well or get worse.  Your e-visit answers were reviewed by a board certified advanced clinical practitioner to complete your personal care plan.  Depending on the condition, your plan could have included both over the counter or prescription medications.  If there is a problem please reply  once you have received a response from your provider.  Your safety is important to us.  If you have drug allergies check your prescription carefully.    You can use MyChart to ask questions about today's visit, request a non-urgent call back, or ask for a work or school excuse for 24 hours related to this e-Visit. If it has been greater than 24 hours you will need to follow up with your provider, or enter a new e-Visit to address those concerns.  You will get an e-mail in the next two days asking about   your experience.  I hope that your e-visit has been valuable and will speed your recovery. Thank you for using e-visits.   

## 2017-10-01 DIAGNOSIS — H109 Unspecified conjunctivitis: Secondary | ICD-10-CM | POA: Diagnosis not present

## 2017-10-01 DIAGNOSIS — H5789 Other specified disorders of eye and adnexa: Secondary | ICD-10-CM | POA: Diagnosis not present

## 2017-10-01 DIAGNOSIS — R05 Cough: Secondary | ICD-10-CM | POA: Diagnosis not present

## 2017-10-01 DIAGNOSIS — J018 Other acute sinusitis: Secondary | ICD-10-CM | POA: Diagnosis not present

## 2017-10-01 MED FILL — clonazePAM 0.5 MG TABS: 0.5 | 30 days supply | Qty: 30 | Fill #0

## 2017-10-07 DIAGNOSIS — H16141 Punctate keratitis, right eye: Secondary | ICD-10-CM | POA: Diagnosis not present

## 2017-10-14 DIAGNOSIS — H16143 Punctate keratitis, bilateral: Secondary | ICD-10-CM | POA: Diagnosis not present

## 2017-10-20 ENCOUNTER — Encounter: Payer: Self-pay | Admitting: Physician Assistant

## 2017-10-22 DIAGNOSIS — N803 Endometriosis of pelvic peritoneum: Secondary | ICD-10-CM | POA: Diagnosis not present

## 2017-10-22 DIAGNOSIS — Z30432 Encounter for removal of intrauterine contraceptive device: Secondary | ICD-10-CM | POA: Diagnosis not present

## 2017-10-28 MED FILL — AZITHROMYCIN 250 MG TABS: 250 | 5 days supply | Qty: 6 | Fill #0

## 2017-10-28 MED FILL — PROPRANOLOL ER 60 MG CAP: 60 | 30 days supply | Qty: 30 | Fill #3

## 2017-10-28 MED FILL — PANTOPRAZOLE SOD DR 20 MG T: 20 | 30 days supply | Qty: 30 | Fill #3

## 2017-10-28 MED FILL — METHYLPREDNISOLONE 4 MG TAB: 4 | 6 days supply | Qty: 21 | Fill #0

## 2017-10-29 MED FILL — clonazePAM 0.5 MG TABS: 0.5 | 30 days supply | Qty: 30 | Fill #1

## 2017-12-13 MED FILL — clonazePAM 0.5 MG TABS: 0.5 | 30 days supply | Qty: 30 | Fill #2

## 2017-12-13 MED FILL — PANTOPRAZOLE SOD DR 20 MG T: 20 | 30 days supply | Qty: 30 | Fill #4

## 2017-12-13 MED FILL — PROPRANOLOL ER 60 MG CAP: 60 | 30 days supply | Qty: 30 | Fill #4

## 2017-12-22 MED FILL — CYCLOBENZAPRINE 10 MG TAB: 10 | 15 days supply | Qty: 45 | Fill #0

## 2018-01-05 DIAGNOSIS — K580 Irritable bowel syndrome with diarrhea: Secondary | ICD-10-CM | POA: Diagnosis not present

## 2018-01-05 DIAGNOSIS — Z13220 Encounter for screening for lipoid disorders: Secondary | ICD-10-CM | POA: Diagnosis not present

## 2018-01-05 DIAGNOSIS — Z0000 Encounter for general adult medical examination without abnormal findings: Secondary | ICD-10-CM | POA: Diagnosis not present

## 2018-01-05 DIAGNOSIS — F419 Anxiety disorder, unspecified: Secondary | ICD-10-CM | POA: Diagnosis not present

## 2018-01-05 DIAGNOSIS — G43909 Migraine, unspecified, not intractable, without status migrainosus: Secondary | ICD-10-CM | POA: Diagnosis not present

## 2018-01-05 DIAGNOSIS — K5790 Diverticulosis of intestine, part unspecified, without perforation or abscess without bleeding: Secondary | ICD-10-CM | POA: Diagnosis not present

## 2018-01-05 DIAGNOSIS — Z1329 Encounter for screening for other suspected endocrine disorder: Secondary | ICD-10-CM | POA: Diagnosis not present

## 2018-01-05 DIAGNOSIS — J45909 Unspecified asthma, uncomplicated: Secondary | ICD-10-CM | POA: Diagnosis not present

## 2018-01-05 DIAGNOSIS — Z130 Encounter for screening for diseases of the blood and blood-forming organs and certain disorders involving the immune mechanism: Secondary | ICD-10-CM | POA: Diagnosis not present

## 2018-01-05 MED FILL — MONTELUKAST SOD 10 MG TAB: 10 | 90 days supply | Qty: 90 | Fill #0

## 2018-01-06 MED FILL — PANTOPRAZOLE SOD DR 20 MG T: 20 | 30 days supply | Qty: 30 | Fill #0

## 2018-01-31 MED FILL — PROPRANOLOL ER 60 MG CAP: 60 | 30 days supply | Qty: 30 | Fill #5

## 2018-01-31 MED FILL — clonazePAM 0.5 MG TABS: 0.5 | 60 days supply | Qty: 30 | Fill #0

## 2018-01-31 MED FILL — NAPROXEN 500 MG TABLET: 500 | 22 days supply | Qty: 45 | Fill #0

## 2018-02-10 MED FILL — ESCITALOPRAM 10 MG TABLET: 10 | 90 days supply | Qty: 90 | Fill #0

## 2018-02-10 MED FILL — SUMAtriptan SUCCINATE 100 M: 100 | 30 days supply | Qty: 9 | Fill #0

## 2018-03-30 MED FILL — clonazePAM 0.5 MG TABS: 0.5 | 60 days supply | Qty: 30 | Fill #1

## 2018-03-30 MED FILL — MONTELUKAST SOD 10 MG TAB: 10 | 90 days supply | Qty: 90 | Fill #1

## 2018-03-30 MED FILL — SUMAtriptan SUCCINATE 100 M: 100 | 30 days supply | Qty: 9 | Fill #1

## 2018-03-30 MED FILL — PANTOPRAZOLE SOD DR 20 MG T: 20 | 30 days supply | Qty: 30 | Fill #1

## 2018-03-30 MED FILL — PROPRANOLOL HCL ER 60 MG CP: 60 | 90 days supply | Qty: 90 | Fill #0

## 2018-03-30 MED FILL — NAPROXEN 500 MG TABLET: 500 | 22 days supply | Qty: 45 | Fill #1

## 2018-04-25 ENCOUNTER — Telehealth: Payer: 59 | Admitting: Physician Assistant

## 2018-04-25 DIAGNOSIS — J018 Other acute sinusitis: Secondary | ICD-10-CM | POA: Diagnosis not present

## 2018-04-25 MED ORDER — AMOXICILLIN-POT CLAVULANATE 875-125 MG PO TABS: 1.0000 | ORAL_TABLET | Freq: Two times a day (BID) | ORAL | 0 refills | Status: DC

## 2018-04-25 MED ORDER — FLUTICASONE PROPIONATE 50 MCG/ACT NA SUSP: 2.0000 | Freq: Every day | NASAL | 6 refills | Status: DC

## 2018-04-25 NOTE — Progress Notes (Signed)
We are sorry that you are not feeling well.  Here is how we plan to help!  Based on what you have shared with me it looks like you have sinusitis.  Sinusitis is inflammation and infection in the sinus cavities of the head.  Based on your presentation I believe you most likely have Acute Bacterial Sinusitis.  This is an infection caused by bacteria and is treated with antibiotics. I have prescribed Augmentin 875mg /125mg  one tablet twice daily with food, for 10 days. Also, a steroid nasal spray, Flonase two sprays in each nostril once daily, has been prescribed. You may use an oral decongestant such as Mucinex D or if you have glaucoma or high blood pressure use plain Mucinex. Saline nasal spray help and can safely be used as often as needed for congestion.  If you develop worsening sinus pain, fever or notice severe headache and vision changes, or if symptoms are not better after completion of antibiotic, please schedule an appointment with a health care provider.    Sinus infections are not as easily transmitted as other respiratory infection, however we still recommend that you avoid close contact with loved ones, especially the very young and elderly.  Remember to wash your hands thoroughly throughout the day as this is the number one way to prevent the spread of infection!  Home Care:  Only take medications as instructed by your medical team.  Complete the entire course of an antibiotic.  Do not take these medications with alcohol.  A steam or ultrasonic humidifier can help congestion.  You can place a towel over your head and breathe in the steam from hot water coming from a faucet.  Avoid close contacts especially the very young and the elderly.  Cover your mouth when you cough or sneeze.  Always remember to wash your hands.  Get Help Right Away If:  You develop worsening fever or sinus pain.  You develop a severe head ache or visual changes.  Your symptoms persist after you have  completed your treatment plan.  Make sure you  Understand these instructions.  Will watch your condition.  Will get help right away if you are not doing well or get worse.  Your e-visit answers were reviewed by a board certified advanced clinical practitioner to complete your personal care plan.  Depending on the condition, your plan could have included both over the counter or prescription medications.  If there is a problem please reply  once you have received a response from your provider.  Your safety is important to [place].  If you have drug allergies check your prescription carefully.    You can use MyChart to ask questions about today's visit, request a non-urgent call back, or ask for a work or school excuse for 24 hours related to this e-Visit. If it has been greater than 24 hours you will need to follow up with your provider, or enter a new e-Visit to address those concerns.  You will get an e-mail in the next two days asking about your experience.  I hope that your e-visit has been valuable and will speed your recovery. Thank you for using e-visits.

## 2018-04-27 MED FILL — PANTOPRAZOLE SOD DR 20 MG T: 20 | 30 days supply | Qty: 30 | Fill #2 | Status: TO

## 2018-05-04 MED FILL — ESCITALOPRAM 10 MG TABLET: 10 | 90 days supply | Qty: 90 | Fill #1 | Status: TO

## 2018-05-13 ENCOUNTER — Telehealth: Payer: 59 | Admitting: Family

## 2018-05-13 DIAGNOSIS — R6889 Other general symptoms and signs: Secondary | ICD-10-CM | POA: Diagnosis not present

## 2018-05-13 DIAGNOSIS — J121 Respiratory syncytial virus pneumonia: Secondary | ICD-10-CM | POA: Diagnosis not present

## 2018-05-13 MED ORDER — BENZONATATE 100 MG PO CAPS: 100.0000 mg | ORAL_CAPSULE | Freq: Three times a day (TID) | ORAL | 0 refills | Status: DC | PRN

## 2018-05-13 MED ORDER — OSELTAMIVIR PHOSPHATE 75 MG PO CAPS: 75.0000 mg | ORAL_CAPSULE | Freq: Two times a day (BID) | ORAL | 0 refills | Status: DC

## 2018-05-13 NOTE — Progress Notes (Signed)
Thank you for the details you included in the comment boxes. Those details are very helpful in determining the best course of treatment for you and help [place] to provide the best care. Given your long symptom list and working at [employer], you do have a high chance of flu exposure and contracting influenza. See plan below. I will also send Tessalon perles 100mg , take 1-2 every 8 hours as needed for cough.  E visit for Flu like symptoms   We are sorry that you are not feeling well.  Here is how we plan to help! Based on what you have shared with me it looks like you may have a respiratory virus that may be influenza.  Influenza or "the flu" is   an infection caused by a respiratory virus. The flu virus is highly contagious and persons who did not receive their yearly flu vaccination may "catch" the flu from close contact.  We have anti-viral medications to treat the viruses that cause this infection. They are not a "cure" and only shorten the course of the infection. These prescriptions are most effective when they are given within the first 2 days of "flu" symptoms. Antiviral medication are indicated if you have a high risk of complications from the flu. You should  also consider an antiviral medication if you are in close contact with someone who is at risk. These medications can help patients avoid complications from the flu  but have side effects that you should know. Possible side effects from Tamiflu or oseltamivir include nausea, vomiting, diarrhea, dizziness, headaches, eye redness, sleep problems or other respiratory symptoms. You should not take Tamiflu if you have an allergy to oseltamivir or any to the ingredients in Tamiflu.  Based upon your symptoms and potential risk factors I have prescribed Oseltamivir (Tamiflu).  It has been sent to your designated pharmacy.  You will take one 75 mg capsule orally twice a day for the next 5 days.  ANYONE WHO HAS FLU SYMPTOMS SHOULD: . Stay home. The flu is  highly contagious and going out or to work exposes others! . Be sure to drink plenty of fluids. Water is fine as well as fruit juices, sodas and electrolyte beverages. You may want to stay away from caffeine or alcohol. If you are nauseated, try taking small sips of liquids. How do you know if you are getting enough fluid? Your urine should be a pale yellow or almost colorless. . Get rest. . Taking a steamy shower or using a humidifier may help nasal congestion and ease sore throat pain. Using a saline nasal spray works much the same way. . Cough drops, hard candies and sore throat lozenges may ease your cough. . Line up a caregiver. Have someone check on you regularly.   GET HELP RIGHT AWAY IF: . You cannot keep down liquids or your medications. . You become short of breath . Your fell like you are going to pass out or loose consciousness. . Your symptoms persist after you have completed your treatment plan MAKE SURE YOU   Understand these instructions.  Will watch your condition.  Will get help right away if you are not doing well or get worse.  Your e-visit answers were reviewed by a board certified advanced clinical practitioner to complete your personal care plan.  Depending on the condition, your plan could have included both over the counter or prescription medications.  If there is a problem please reply  once you have received a response from  your provider.  Your safety is important to [place].  If you have drug allergies check your prescription carefully.    You can use MyChart to ask questions about today's visit, request a non-urgent call back, or ask for a work or school excuse for 24 hours related to this e-Visit. If it has been greater than 24 hours you will need to follow up with your provider, or enter a new e-Visit to address those concerns.  You will get an e-mail in the next two days asking about your experience.  I hope that your e-visit has been valuable and will speed  your recovery. Thank you for using e-visits.

## 2018-05-15 DIAGNOSIS — R509 Fever, unspecified: Secondary | ICD-10-CM | POA: Diagnosis not present

## 2018-05-15 DIAGNOSIS — J029 Acute pharyngitis, unspecified: Secondary | ICD-10-CM | POA: Diagnosis not present

## 2018-05-15 DIAGNOSIS — J209 Acute bronchitis, unspecified: Secondary | ICD-10-CM | POA: Diagnosis not present

## 2018-05-15 DIAGNOSIS — N915 Oligomenorrhea, unspecified: Secondary | ICD-10-CM | POA: Diagnosis not present

## 2018-05-15 DIAGNOSIS — R05 Cough: Secondary | ICD-10-CM | POA: Diagnosis not present

## 2018-05-25 MED FILL — clonazePAM 1 MG TABS: 1 | 30 days supply | Qty: 30 | Fill #0

## 2018-07-07 DIAGNOSIS — Z124 Encounter for screening for malignant neoplasm of cervix: Secondary | ICD-10-CM | POA: Diagnosis not present

## 2018-07-07 DIAGNOSIS — N803 Endometriosis of pelvic peritoneum: Secondary | ICD-10-CM | POA: Diagnosis not present

## 2018-07-07 DIAGNOSIS — Z6841 Body Mass Index (BMI) 40.0 and over, adult: Secondary | ICD-10-CM | POA: Diagnosis not present

## 2018-07-07 DIAGNOSIS — N970 Female infertility associated with anovulation: Secondary | ICD-10-CM | POA: Diagnosis not present

## 2018-07-07 DIAGNOSIS — Z01419 Encounter for gynecological examination (general) (routine) without abnormal findings: Secondary | ICD-10-CM | POA: Diagnosis not present

## 2018-07-07 DIAGNOSIS — Z130 Encounter for screening for diseases of the blood and blood-forming organs and certain disorders involving the immune mechanism: Secondary | ICD-10-CM | POA: Diagnosis not present

## 2018-07-07 DIAGNOSIS — N926 Irregular menstruation, unspecified: Secondary | ICD-10-CM | POA: Diagnosis not present

## 2018-07-07 DIAGNOSIS — Z1389 Encounter for screening for other disorder: Secondary | ICD-10-CM | POA: Diagnosis not present

## 2018-07-08 DIAGNOSIS — Z1151 Encounter for screening for human papillomavirus (HPV): Secondary | ICD-10-CM | POA: Diagnosis not present

## 2018-07-08 DIAGNOSIS — Z124 Encounter for screening for malignant neoplasm of cervix: Secondary | ICD-10-CM | POA: Diagnosis not present

## 2018-07-08 DIAGNOSIS — R87610 Atypical squamous cells of undetermined significance on cytologic smear of cervix (ASC-US): Secondary | ICD-10-CM | POA: Diagnosis not present

## 2019-03-31 ENCOUNTER — Institutional Professional Consult (permissible substitution): Payer: 59 | Admitting: Physician Assistant

## 2019-03-31 ENCOUNTER — Encounter: Payer: Self-pay | Admitting: Physician Assistant

## 2019-03-31 ENCOUNTER — Telehealth (INDEPENDENT_AMBULATORY_CARE_PROVIDER_SITE_OTHER): Payer: 59 | Admitting: Physician Assistant

## 2019-03-31 ENCOUNTER — Other Ambulatory Visit: Payer: Self-pay

## 2019-03-31 DIAGNOSIS — G43009 Migraine without aura, not intractable, without status migrainosus: Secondary | ICD-10-CM | POA: Diagnosis not present

## 2019-03-31 MED ORDER — NURTEC 75 MG PO TBDP: 75.0000 mg | ORAL_TABLET | ORAL | 11 refills | Status: DC | PRN

## 2019-03-31 MED ORDER — CYCLOBENZAPRINE HCL 10 MG PO TABS: 10.0000 mg | ORAL_TABLET | Freq: Three times a day (TID) | ORAL | 3 refills | Status: DC | PRN

## 2019-03-31 MED ORDER — PROMETHAZINE HCL 25 MG PO TABS: 25.0000 mg | ORAL_TABLET | Freq: Four times a day (QID) | ORAL | 1 refills | Status: DC | PRN

## 2019-03-31 NOTE — Progress Notes (Signed)
TELEHEALTH VIRTUAL GYNECOLOGY VISIT ENCOUNTER NOTE  I connected with [name] [name] on 03/31/19 at  9:30 AM EDT by telephone at home and verified that I am speaking with the correct person using two identifiers.   I discussed the limitations, risks, security and privacy concerns of performing an evaluation and management service by telephone and the availability of in person appointments. I also discussed with the patient that there may be a patient responsible charge related to this service. The patient expressed understanding and agreed to proceed.   History:  [name] [name] is a 33 y.o.  female being evaluated today for headache.  She was diagnosed with migraine 5 years ago but they have gotten worse.  Her PCP had her on propanolol for prevention which was helpful.  She stopped the propanolol to try for pregnancy.  She has been trying for over a year without success.   She also has PCOS and endometriosis.  She is taking metformin and Femerra.    Headaches get to be severe, located frontal and occiput/neck, bilateral.  There is pulsating and movement makes it worse.  They can last several days.  Lights and noises make it worse as well as smells.  There is nausea and occasional vomiting.  No warning.  There is blurred vision during the headache.   She is having a headache every week lasting a day or less but then there is a full blown migraine 2-3 times per month.    Tylenol is not helpful. Fioricet did not help.  She sometimes gets IV fluids at work to help.  She is a Engineer, civil (consulting)nurse.   Benadryl + ibuprofen works better for acute migraine.  Benadryl does not cause sedation.  She has used Imitrex and maxalt in the past.  Imitrex caused sedation and did not help.  Maxalt just did not help.     Past Medical History:  Diagnosis Date  . Anemia   . Anxiety   . Arthritis    knee  . Asthma   . Back pain    lower back  . Colitis   . Complication of anesthesia   . Depression   . Diverticulitis    . Endometriosis 08/20/2017  . GERD (gastroesophageal reflux disease)   . History of kidney stones    passed stones-no surgery required  . Migraine   . Nerve damage    left femoral nerve  . 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 PONV (postoperative nausea and vomiting)   . Seasonal allergies   . Seizures (HCC)    febrile seizure as a child, no problems as aduilt   Past Surgical History:  Procedure Laterality Date  . BOWEL RESECTION  02/10/2017  . CHOLECYSTECTOMY    . COLONOSCOPY    . 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 KNEE SURGERY     left knee  . LAPAROSCOPIC LYSIS OF ADHESIONS N/A 08/20/2017   Procedure: EXTENSIVE LAPAROSCOPIC LYSIS OF ADHESIONS;  Surgeon: Sherian ReinBovard-Stuckert, Jody, MD;  Location: [facility];  Service: Gynecology;  Laterality: N/A;  . LAPAROSCOPY N/A 08/20/2017   Procedure: LAPAROSCOPY OPERATIVE, LEFT OVARIAN CYSTECTOMY, RIGHT PARATUBAL CYSTECTOMY;  Surgeon: Sherian ReinBovard-Stuckert, Jody, MD;  Location: [facility];  Service: Gynecology;  Laterality: N/A;  . UPPER GI ENDOSCOPY    . WISDOM TOOTH EXTRACTION     The following portions of the patient's history were reviewed and updated as appropriate: allergies, current medications, past  family history, past medical history, past social history, past surgical history and problem list.   Review of Systems:  Pertinent items noted in HPI and remainder of comprehensive ROS otherwise negative.  Physical Exam:   General:  Alert, oriented and cooperative.   Mental Status: Normal mood and affect perceived. Normal judgment and thought content.  Physical exam deferred due to nature of the encounter  Labs and Imaging No results found for this or any previous visit (from the past 336 hour(s)). No results found.    Assessment and Plan:   1. Migraine without aura and without status migrainosus, not intractable         Nurtec for migraine - when she is certain she is not pregnant - education  provided/sample and savings card left with reception for pt to pick up at her convenience Flexeril - use for HA/muscle spasm prn - sedation precautions - okay to use even during pregnancy Phenergan for rescue Benadryl also is okay for use to treat the migraine Lifestyle discussed.  I do believe that working a regular schedule without occasional nights, would help overall.   Pt advised there are other acute treatments we can use, especially in office, if needed.   F/u PRN   I discussed the assessment and treatment plan with the patient. The patient was provided an opportunity to ask questions and all were answered. The patient agreed with the plan and demonstrated an understanding of the instructions.   The patient was advised to call back or seek an in-person evaluation/go to the ED if the symptoms worsen or if the condition fails to improve as anticipated.  I provided 25 minutes of non-face-to-face time during this encounter.

## 2019-03-31 NOTE — Patient Instructions (Signed)

## 2019-08-11 MED ORDER — REYVOW 100 MG PO TABS: 100.0000 mg | ORAL_TABLET | ORAL | 1 refills | Status: DC | PRN

## 2019-08-11 NOTE — Telephone Encounter (Signed)
Pt on day 4 of a migraine.  She has tried all of the medications previously prescribed.  No relief.  She has no one at home that can drive her to the office for an injection/appt/sample/etc.  Options discussed.  Will try Reyvow for rescue.  Side effects discussed.  She agrees to not drive for 8 hours.  Safety discussed.  Rx sent to pharmacy.   RTC prn

## 2019-08-14 ENCOUNTER — Telehealth: Payer: Self-pay | Admitting: *Deleted

## 2019-08-14 ENCOUNTER — Encounter: Payer: Self-pay | Admitting: *Deleted

## 2019-08-14 MED ORDER — CHLORPROMAZINE HCL 25 MG PO TABS: 25.0000 mg | ORAL_TABLET | ORAL | 1 refills | Status: DC | PRN

## 2019-08-14 NOTE — Telephone Encounter (Signed)
Pt left a message that the Reyvow was not covered. Spoke with Nada Maclachlan PA-C, and will send in new RX.

## 2019-10-10 ENCOUNTER — Other Ambulatory Visit: Payer: Self-pay | Admitting: Obstetrics and Gynecology

## 2019-10-20 ENCOUNTER — Other Ambulatory Visit: Payer: Self-pay | Admitting: Obstetrics and Gynecology

## 2019-10-20 DIAGNOSIS — Z3169 Encounter for other general counseling and advice on procreation: Secondary | ICD-10-CM

## 2019-10-23 ENCOUNTER — Encounter: Payer: Self-pay | Admitting: Radiology

## 2019-10-23 ENCOUNTER — Ambulatory Visit
Admission: RE | Admit: 2019-10-23 | Discharge: 2019-10-23 | Disposition: A | Discharge status: Home / Self Care | Payer: 59 | Source: Ambulatory Visit | Attending: Obstetrics and Gynecology | Admitting: Obstetrics and Gynecology

## 2019-10-23 DIAGNOSIS — Z3169 Encounter for other general counseling and advice on procreation: Secondary | ICD-10-CM

## 2019-10-24 ENCOUNTER — Encounter: Payer: Self-pay | Admitting: Physician Assistant

## 2019-10-27 ENCOUNTER — Other Ambulatory Visit: Payer: Self-pay | Admitting: Physician Assistant

## 2020-04-16 ENCOUNTER — Other Ambulatory Visit: Payer: Self-pay | Admitting: Physician Assistant

## 2020-04-23 ENCOUNTER — Telehealth: Payer: 59 | Admitting: Physician Assistant

## 2020-04-23 DIAGNOSIS — L03019 Cellulitis of unspecified finger: Secondary | ICD-10-CM | POA: Diagnosis not present

## 2020-04-23 MED ORDER — CEPHALEXIN 500 MG PO CAPS: 500.0000 mg | ORAL_CAPSULE | Freq: Four times a day (QID) | ORAL | 0 refills | Status: AC

## 2020-04-23 NOTE — Progress Notes (Signed)
E Visit for Cellulitis  We are sorry that you are not feeling well. Here is how we plan to help!  Based on what you shared with me it looks like you have cellulitis.  Cellulitis looks like areas of skin redness, swelling, and warmth; it develops as a result of bacteria entering under the skin. Little red spots and/or bleeding can be seen in skin, and tiny surface sacs containing fluid can occur. Fever can be present. Cellulitis is almost always on one side of a body, and the lower limbs are the most common site of involvement.   I have prescribed:  Keflex 500mg  take one by mouth four times a day for 5 days    It is hard to tell without seeing you in person if there is also a fungal infection happening. I would continue the over the counter fungal cream twice daily. If no improvement please see a provider in person for further evaluation  HOME CARE:  . Take your medications as ordered and take all of them, even if the skin irritation appears to be healing.   GET HELP RIGHT AWAY IF:  . Symptoms that don't begin to go away within 48 hours. . Severe redness persists or worsens . If the area turns color, spreads or swells. . If it blisters and opens, develops yellow-brown crust or bleeds. . You develop a fever or chills. . If the pain increases or becomes unbearable.  . Are unable to keep fluids and food down.  MAKE SURE YOU    Understand these instructions.  Will watch your condition.  Will get help right away if you are not doing well or get worse.  Thank you for choosing an e-visit. Your e-visit answers were reviewed by a board certified advanced clinical practitioner to complete your personal care plan. Depending upon the condition, your plan could have included both over the counter or prescription medications. Please review your pharmacy choice. Make sure the pharmacy is open so you can pick up prescription now. If there is a problem, you may contact your provider through and have the prescription routed to another pharmacy. Your safety is important to [employer]. If you have drug allergies check your prescription carefully.  For the next 24 hours you can use MyChart to ask questions about today's visit, request a non-urgent call back, or ask for a work or school excuse. You will get an email in the next two days asking about your experience. I hope that your e-visit has been valuable and will speed your recovery.   6 minutes spent charting

## 2020-05-02 ENCOUNTER — Other Ambulatory Visit (HOSPITAL_COMMUNITY): Payer: Self-pay | Admitting: General Surgery

## 2020-05-02 DIAGNOSIS — L301 Dyshidrosis [pompholyx]: Secondary | ICD-10-CM | POA: Diagnosis not present

## 2020-05-02 MED FILL — hydrOXYzine HCL 10 MG TABS: 10 | 30 days supply | Qty: 30 | Fill #0

## 2020-05-02 MED FILL — CLOBETASOL PROPIONATE 0.05: 0.05 | 30 days supply | Qty: 60 | Fill #0

## 2020-06-03 DIAGNOSIS — G43909 Migraine, unspecified, not intractable, without status migrainosus: Secondary | ICD-10-CM | POA: Diagnosis not present

## 2020-08-05 DIAGNOSIS — Z049 Encounter for examination and observation for unspecified reason: Secondary | ICD-10-CM | POA: Diagnosis not present

## 2020-08-05 DIAGNOSIS — G43839 Menstrual migraine, intractable, without status migrainosus: Secondary | ICD-10-CM | POA: Diagnosis not present

## 2020-08-05 DIAGNOSIS — G43719 Chronic migraine without aura, intractable, without status migrainosus: Secondary | ICD-10-CM | POA: Diagnosis not present

## 2020-08-06 DIAGNOSIS — G43839 Menstrual migraine, intractable, without status migrainosus: Secondary | ICD-10-CM | POA: Diagnosis not present

## 2020-08-06 DIAGNOSIS — M791 Myalgia, unspecified site: Secondary | ICD-10-CM | POA: Diagnosis not present

## 2020-08-06 DIAGNOSIS — M542 Cervicalgia: Secondary | ICD-10-CM | POA: Diagnosis not present

## 2020-08-06 DIAGNOSIS — G43719 Chronic migraine without aura, intractable, without status migrainosus: Secondary | ICD-10-CM | POA: Diagnosis not present

## 2020-08-20 DIAGNOSIS — M791 Myalgia, unspecified site: Secondary | ICD-10-CM | POA: Diagnosis not present

## 2020-08-20 DIAGNOSIS — M542 Cervicalgia: Secondary | ICD-10-CM | POA: Diagnosis not present

## 2020-08-20 DIAGNOSIS — G43719 Chronic migraine without aura, intractable, without status migrainosus: Secondary | ICD-10-CM | POA: Diagnosis not present

## 2020-08-20 DIAGNOSIS — G43839 Menstrual migraine, intractable, without status migrainosus: Secondary | ICD-10-CM | POA: Diagnosis not present

## 2020-08-27 DIAGNOSIS — E669 Obesity, unspecified: Secondary | ICD-10-CM | POA: Diagnosis not present

## 2020-08-27 DIAGNOSIS — Z319 Encounter for procreative management, unspecified: Secondary | ICD-10-CM | POA: Diagnosis not present

## 2020-08-27 DIAGNOSIS — Z3141 Encounter for fertility testing: Secondary | ICD-10-CM | POA: Diagnosis not present

## 2020-09-02 DIAGNOSIS — Z3202 Encounter for pregnancy test, result negative: Secondary | ICD-10-CM | POA: Diagnosis not present

## 2020-09-02 DIAGNOSIS — Z3141 Encounter for fertility testing: Secondary | ICD-10-CM | POA: Diagnosis not present

## 2020-09-03 DIAGNOSIS — G43909 Migraine, unspecified, not intractable, without status migrainosus: Secondary | ICD-10-CM | POA: Diagnosis not present

## 2020-09-03 DIAGNOSIS — I10 Essential (primary) hypertension: Secondary | ICD-10-CM | POA: Diagnosis not present

## 2020-09-05 ENCOUNTER — Other Ambulatory Visit (HOSPITAL_COMMUNITY): Payer: Self-pay | Admitting: Nurse Practitioner

## 2020-09-05 MED FILL — BUTORPHANOL 10 MG/ML SPRAY: 10 | 5 days supply | Qty: 3 | Fill #0

## 2020-09-06 ENCOUNTER — Other Ambulatory Visit (HOSPITAL_COMMUNITY): Payer: Self-pay | Admitting: Nurse Practitioner

## 2020-09-06 DIAGNOSIS — Z6841 Body Mass Index (BMI) 40.0 and over, adult: Secondary | ICD-10-CM | POA: Diagnosis not present

## 2020-09-10 MED FILL — SAXENDA 18 MG/3 ML PEN: 18 | 30 days supply | Qty: 15 | Fill #0

## 2020-09-10 MED FILL — UNIFINE PENTIPS 6MM 31G: 31G X 6 MM | 90 days supply | Qty: 100 | Fill #0

## 2020-09-12 DIAGNOSIS — M791 Myalgia, unspecified site: Secondary | ICD-10-CM | POA: Diagnosis not present

## 2020-09-12 DIAGNOSIS — G43839 Menstrual migraine, intractable, without status migrainosus: Secondary | ICD-10-CM | POA: Diagnosis not present

## 2020-09-12 DIAGNOSIS — M542 Cervicalgia: Secondary | ICD-10-CM | POA: Diagnosis not present

## 2020-09-12 DIAGNOSIS — G43719 Chronic migraine without aura, intractable, without status migrainosus: Secondary | ICD-10-CM | POA: Diagnosis not present

## 2020-09-26 DIAGNOSIS — G43839 Menstrual migraine, intractable, without status migrainosus: Secondary | ICD-10-CM | POA: Diagnosis not present

## 2020-09-26 DIAGNOSIS — G43719 Chronic migraine without aura, intractable, without status migrainosus: Secondary | ICD-10-CM | POA: Diagnosis not present

## 2020-09-26 DIAGNOSIS — M791 Myalgia, unspecified site: Secondary | ICD-10-CM | POA: Diagnosis not present

## 2020-09-26 DIAGNOSIS — M542 Cervicalgia: Secondary | ICD-10-CM | POA: Diagnosis not present

## 2020-09-30 ENCOUNTER — Other Ambulatory Visit (HOSPITAL_COMMUNITY): Payer: Self-pay | Admitting: Nurse Practitioner

## 2020-09-30 DIAGNOSIS — E669 Obesity, unspecified: Secondary | ICD-10-CM | POA: Diagnosis not present

## 2020-09-30 DIAGNOSIS — Z6841 Body Mass Index (BMI) 40.0 and over, adult: Secondary | ICD-10-CM | POA: Diagnosis not present

## 2020-10-04 MED FILL — SAXENDA 18 MG/3 ML PEN: 18 | 30 days supply | Qty: 15 | Fill #0

## 2020-10-08 ENCOUNTER — Telehealth: Payer: 59 | Admitting: Emergency Medicine

## 2020-10-08 DIAGNOSIS — R0602 Shortness of breath: Secondary | ICD-10-CM

## 2020-10-08 DIAGNOSIS — R059 Cough, unspecified: Secondary | ICD-10-CM

## 2020-10-08 NOTE — Progress Notes (Signed)
Your oxygen level is a bit too low for me to feel comfortable managing your symptoms through an e-visit.  Given the cough and congestion, you might need to have an x-ray to ensure no consolidation is forming in your lungs.  I'd recommend that you go to one of our urgent cares for possible imaging and for further treatment which MIGHT include additional nebulizer treatments, steroids, and antibiotics.   NOTE: If you entered your credit card information for this eVisit, you will not be charged. You may see a "hold" on your card for the $35 but that hold will drop off and you will not have a charge processed.   If you are having a true medical emergency please call 911.      For an urgent face to face visit, [facility] has five urgent care centers for your convenience:     [facility] at [facility] Directions [phone] [address] [place], [place] [address] . 10 am - 6pm Monday - Friday    [facility]) Get Driving Directions [phone] [address] [place], [place] [address] . 10 am to 8 pm Monday-Friday . 12 pm to 8 pm [facility] at [facility] Get Driving Directions [phone] [address], [address] [place], [place] [address] . 8 am to 8 pm Monday-Friday . 9 am to 6 pm Saturday . 11 am to 6 pm Sunday     [facility] at [facility] Get Driving Directions  [phone] [address] [place], [place] [address] . 8 am to 8 pm Monday-Friday . 8 am to 4 pm [facility] Urgent Care at [facility] Directions [phone] [address] [place], [place] [address] . 12 pm to 6 pm Monday-Friday      Your e-visit answers were reviewed by a board certified advanced clinical practitioner to complete your personal care plan.  Thank you for using e-Visits.    Approximately 5 minutes was  used in reviewing the patient's chart, questionnaire, prescribing medications, and documentation.

## 2020-10-09 ENCOUNTER — Telehealth: Payer: 59 | Admitting: Emergency Medicine

## 2020-10-09 DIAGNOSIS — J069 Acute upper respiratory infection, unspecified: Secondary | ICD-10-CM

## 2020-10-09 MED ORDER — IPRATROPIUM BROMIDE 0.03 % NA SOLN: NASAL | 12 refills | Status: AC

## 2020-10-09 MED ORDER — BENZONATATE 100 MG PO CAPS: 100.0000 mg | ORAL_CAPSULE | Freq: Three times a day (TID) | ORAL | 0 refills | Status: DC | PRN

## 2020-10-09 NOTE — Progress Notes (Signed)
We are sorry you are not feeling well.  Here is how we plan to help!  Based on what you have shared with me, it looks like you may have a viral upper respiratory infection.  Upper respiratory infections are caused by a large number of viruses; however, rhinovirus is the most common cause.   Symptoms vary from person to person, with common symptoms including sore throat, cough, fatigue or lack of energy and feeling of general discomfort.  A low-grade fever of up to 100.4 may present, but is often uncommon.  Symptoms vary however, and are closely related to a person's age or underlying illnesses.  The most common symptoms associated with an upper respiratory infection are nasal discharge or congestion, cough, sneezing, headache and pressure in the ears and face.  These symptoms usually persist for about 3 to 10 days, but can last up to 2 weeks.  It is important to know that upper respiratory infections do not cause serious illness or complications in most cases.    Upper respiratory infections can be transmitted from person to person, with the most common method of transmission being a person's hands.  The virus is able to live on the skin and can infect other persons for up to 2 hours after direct contact.  Also, these can be transmitted when someone coughs or sneezes; thus, it is important to cover the mouth to reduce this risk.  To keep the spread of the illness at bay, good hand hygiene is very important.  This is an infection that is most likely caused by a virus. There are no specific treatments other than to help you with the symptoms until the infection runs its course.  We are sorry you are not feeling well.  Here is how we plan to help!   For nasal congestion, you may use an oral decongestants such as Mucinex D or if you have glaucoma or high blood pressure use plain Mucinex.  Saline nasal spray or nasal drops can help and can safely be used as often as needed for congestion.  For your congestion,  I have prescribed Ipratropium Bromide nasal spray 0.03% two sprays in each nostril 2-3 times a day  If you do not have a history of heart disease, hypertension, diabetes or thyroid disease, prostate/bladder issues or glaucoma, you may also use Sudafed to treat nasal congestion.  It is highly recommended that you consult with a pharmacist or your primary care physician to ensure this medication is safe for you to take.     If you have a cough, you may use cough suppressants such as Delsym and Robitussin.  If you have glaucoma or high blood pressure, you can also use Coricidin HBP.   For cough I have prescribed for you A prescription cough medication called Tessalon Perles 100 mg. You may take 1-2 capsules every 8 hours as needed for cough  If you have a sore or scratchy throat, use a saltwater gargle-  to  teaspoon of salt dissolved in a 4-ounce to 8-ounce glass of warm water.  Gargle the solution for approximately 15-30 seconds and then spit.  It is important not to swallow the solution.  You can also use throat lozenges/cough drops and Chloraseptic spray to help with throat pain or discomfort.  Warm or cold liquids can also be helpful in relieving throat pain.  For headache, pain or general discomfort, you can use Ibuprofen or Tylenol as directed.   Some authorities believe that zinc sprays or   the use of Echinacea may shorten the course of your symptoms.   HOME CARE . Only take medications as instructed by your medical team. . Be sure to drink plenty of fluids. Water is fine as well as fruit juices, sodas and electrolyte beverages. You may want to stay away from caffeine or alcohol. If you are nauseated, try taking small sips of liquids. How do you know if you are getting enough fluid? Your urine should be a pale yellow or almost colorless. . Get rest. . Taking a steamy shower or using a humidifier may help nasal congestion and ease sore throat pain. You can place a towel over your head and  breathe in the steam from hot water coming from a faucet. . Using a saline nasal spray works much the same way. . Cough drops, hard candies and sore throat lozenges may ease your cough. . Avoid close contacts especially the very young and the elderly . Cover your mouth if you cough or sneeze . Always remember to wash your hands.   GET HELP RIGHT AWAY IF: . You develop worsening fever. . If your symptoms do not improve within 10 days . You develop yellow or green discharge from your nose over 3 days. . You have coughing fits . You develop a severe head ache or visual changes. . You develop shortness of breath, difficulty breathing or start having chest pain . Your symptoms persist after you have completed your treatment plan  MAKE SURE YOU   Understand these instructions.  Will watch your condition.  Will get help right away if you are not doing well or get worse.  Your e-visit answers were reviewed by a board certified advanced clinical practitioner to complete your personal care plan. Depending upon the condition, your plan could have included both over the counter or prescription medications. Please review your pharmacy choice. If there is a problem, you may call our nursing hot line at and have the prescription routed to another pharmacy. Your safety is important to us. If you have drug allergies check your prescription carefully.   You can use MyChart to ask questions about today's visit, request a non-urgent call back, or ask for a work or school excuse for 24 hours related to this e-Visit. If it has been greater than 24 hours you will need to follow up with your provider, or enter a new e-Visit to address those concerns. You will get an e-mail in the next two days asking about your experience.  I hope that your e-visit has been valuable and will speed your recovery. Thank you for using e-visits.   Approximately 5 minutes was spent documenting and reviewing patient's  chart.    

## 2020-10-15 ENCOUNTER — Encounter (HOSPITAL_BASED_OUTPATIENT_CLINIC_OR_DEPARTMENT_OTHER): Payer: Self-pay

## 2020-10-15 ENCOUNTER — Other Ambulatory Visit: Payer: Self-pay

## 2020-10-15 ENCOUNTER — Emergency Department (HOSPITAL_BASED_OUTPATIENT_CLINIC_OR_DEPARTMENT_OTHER)
Admission: EM | Admit: 2020-10-15 | Discharge: 2020-10-15 | Disposition: A | Discharge status: Home / Self Care | Payer: 59 | Source: Home / Self Care | Attending: Emergency Medicine | Admitting: Emergency Medicine

## 2020-10-15 DIAGNOSIS — T782XXA Anaphylactic shock, unspecified, initial encounter: Secondary | ICD-10-CM | POA: Diagnosis not present

## 2020-10-15 DIAGNOSIS — R069 Unspecified abnormalities of breathing: Secondary | ICD-10-CM | POA: Diagnosis not present

## 2020-10-15 DIAGNOSIS — Z888 Allergy status to other drugs, medicaments and biological substances status: Secondary | ICD-10-CM | POA: Diagnosis not present

## 2020-10-15 DIAGNOSIS — I10 Essential (primary) hypertension: Secondary | ICD-10-CM | POA: Diagnosis not present

## 2020-10-15 DIAGNOSIS — F419 Anxiety disorder, unspecified: Secondary | ICD-10-CM | POA: Diagnosis not present

## 2020-10-15 DIAGNOSIS — J45909 Unspecified asthma, uncomplicated: Secondary | ICD-10-CM | POA: Insufficient documentation

## 2020-10-15 DIAGNOSIS — Z79899 Other long term (current) drug therapy: Secondary | ICD-10-CM | POA: Diagnosis not present

## 2020-10-15 DIAGNOSIS — Z885 Allergy status to narcotic agent status: Secondary | ICD-10-CM | POA: Diagnosis not present

## 2020-10-15 DIAGNOSIS — K219 Gastro-esophageal reflux disease without esophagitis: Secondary | ICD-10-CM | POA: Diagnosis not present

## 2020-10-15 DIAGNOSIS — R4702 Dysphasia: Secondary | ICD-10-CM | POA: Diagnosis not present

## 2020-10-15 DIAGNOSIS — F32A Depression, unspecified: Secondary | ICD-10-CM | POA: Diagnosis not present

## 2020-10-15 DIAGNOSIS — Z6841 Body Mass Index (BMI) 40.0 and over, adult: Secondary | ICD-10-CM | POA: Diagnosis not present

## 2020-10-15 DIAGNOSIS — R21 Rash and other nonspecific skin eruption: Secondary | ICD-10-CM | POA: Insufficient documentation

## 2020-10-15 DIAGNOSIS — R0602 Shortness of breath: Secondary | ICD-10-CM | POA: Insufficient documentation

## 2020-10-15 DIAGNOSIS — R0789 Other chest pain: Secondary | ICD-10-CM | POA: Insufficient documentation

## 2020-10-15 DIAGNOSIS — G43009 Migraine without aura, not intractable, without status migrainosus: Secondary | ICD-10-CM | POA: Diagnosis not present

## 2020-10-15 DIAGNOSIS — T7840XA Allergy, unspecified, initial encounter: Secondary | ICD-10-CM

## 2020-10-15 DIAGNOSIS — Z9102 Food additives allergy status: Secondary | ICD-10-CM | POA: Diagnosis not present

## 2020-10-15 DIAGNOSIS — T620X1A Toxic effect of ingested mushrooms, accidental (unintentional), initial encounter: Secondary | ICD-10-CM | POA: Insufficient documentation

## 2020-10-15 DIAGNOSIS — R059 Cough, unspecified: Secondary | ICD-10-CM | POA: Diagnosis not present

## 2020-10-15 DIAGNOSIS — Z20822 Contact with and (suspected) exposure to covid-19: Secondary | ICD-10-CM | POA: Diagnosis not present

## 2020-10-15 DIAGNOSIS — R9431 Abnormal electrocardiogram [ECG] [EKG]: Secondary | ICD-10-CM | POA: Diagnosis not present

## 2020-10-15 DIAGNOSIS — J45901 Unspecified asthma with (acute) exacerbation: Secondary | ICD-10-CM | POA: Diagnosis not present

## 2020-10-15 HISTORY — DX: Ulcerative colitis, unspecified, without complications: K51.90

## 2020-10-15 MED ORDER — DEXAMETHASONE SODIUM PHOSPHATE 10 MG/ML IJ SOLN
10.0000 mg | Freq: Once | INTRAMUSCULAR | Status: AC
  Administered 2020-10-15: 10 mg via INTRAVENOUS
  Filled 2020-10-15: qty 1

## 2020-10-15 MED ORDER — EPINEPHRINE 0.3 MG/0.3ML IJ SOAJ
INTRAMUSCULAR | Status: AC
  Administered 2020-10-15: 0.3 mg via INTRAMUSCULAR
  Filled 2020-10-15: qty 0.3

## 2020-10-15 MED ORDER — EPINEPHRINE 0.3 MG/0.3ML IJ SOAJ: 0.3000 mg | Freq: Once | INTRAMUSCULAR | Status: AC

## 2020-10-15 MED ORDER — FAMOTIDINE IN NACL 20-0.9 MG/50ML-% IV SOLN
INTRAVENOUS | Status: AC
  Administered 2020-10-15: 20 mg via INTRAVENOUS
  Filled 2020-10-15: qty 50

## 2020-10-15 MED ORDER — PREDNISONE 10 MG PO TABS: ORAL_TABLET | ORAL | 0 refills | Status: DC

## 2020-10-15 MED ORDER — FAMOTIDINE IN NACL 20-0.9 MG/50ML-% IV SOLN: 20.0000 mg | Freq: Once | INTRAVENOUS | Status: DC

## 2020-10-15 MED ORDER — IPRATROPIUM-ALBUTEROL 0.5-2.5 (3) MG/3ML IN SOLN
3.0000 mL | Freq: Once | RESPIRATORY_TRACT | Status: AC
  Administered 2020-10-15: 3 mL via RESPIRATORY_TRACT
  Filled 2020-10-15: qty 3

## 2020-10-15 MED ORDER — EPINEPHRINE 0.3 MG/0.3ML IJ SOAJ: 0.3000 mg | INTRAMUSCULAR | 2 refills | Status: AC | PRN

## 2020-10-15 NOTE — ED Notes (Signed)
Following Duoneb administration, pt reports feeling better, chest is more open and able to take a deep breath.

## 2020-10-15 NOTE — ED Notes (Signed)
Epi pen administered at this time

## 2020-10-15 NOTE — ED Provider Notes (Signed)
[facility] Provider [facility]   CSN: [id number] Arrival date & time: 10/15/20  [id number]     History Chief Complaint  Patient presents with  . Shortness of Breath  . Allergic Reaction    Abbott PaoKristy Batiz is a 35 y.o. female.  Patient presents to the emergency department with rash, itching, wheezing and shortness of breath.  Patient reports that she ate a sub and thinks she is having allergic reaction.  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  Patient has taken a total of 75 mg of Benadryl at home without much improvement.  She reports that her wheezing has worsened, she has used an inhaler and a nebulizer without significant improvement as well.        Past Medical History:  Diagnosis Date  . Anemia   . Anxiety   . Arthritis    knee  . Asthma   . Back pain    lower back  . Colitis   . Complication of anesthesia   . Depression   . Diverticulitis   . Endometriosis 08/20/2017  . GERD (gastroesophageal reflux disease)   . History of kidney stones    passed stones-no surgery required  . Migraine   . Nerve damage    left femoral nerve  . 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 PONV (postoperative nausea and vomiting)   . Seasonal allergies   . Seizures (HCC)    febrile seizure as a child, no problems as aduilt  . Ulcerative colitis [facility](HCC)     Patient Active Problem List   Diagnosis Date Noted  . Migraine without aura and without status migrainosus, not intractable 03/31/2019  . Endometriosis 08/20/2017  . Diverticular disease 02/10/2017    Past Surgical History:  Procedure Laterality Date  . BOWEL RESECTION  02/10/2017  . BOWEL RESECTION    . CHOLECYSTECTOMY    . COLONOSCOPY    . 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 KNEE SURGERY     left knee  . LAPAROSCOPIC LYSIS OF ADHESIONS N/A 08/20/2017   Procedure: EXTENSIVE LAPAROSCOPIC LYSIS OF ADHESIONS;  Surgeon: Sherian ReinBovard-Stuckert,  Jody, MD;  Location: [facility];  Service: Gynecology;  Laterality: N/A;  . LAPAROSCOPY N/A 08/20/2017   Procedure: LAPAROSCOPY OPERATIVE, LEFT OVARIAN CYSTECTOMY, RIGHT PARATUBAL CYSTECTOMY;  Surgeon: Sherian ReinBovard-Stuckert, Jody, MD;  Location: [facility];  Service: Gynecology;  Laterality: N/A;  . UPPER GI ENDOSCOPY    . WISDOM TOOTH EXTRACTION       OB History   No obstetric history on file.     History reviewed. No pertinent family history.  Social History   Tobacco Use  . Smoking status: Never Smoker  . Smokeless tobacco: Never Used  Vaping Use  . Vaping Use: Never used  Substance Use Topics  . Alcohol use: Yes    Comment: socially  . Drug use: No    Home Medications Prior to Admission medications   Medication Sig Start Date End Date Taking? Authorizing Provider  EPINEPHrine 0.3 mg/0.3 mL IJ SOAJ injection Inject 0.3 mg into the muscle as needed for anaphylaxis. 10/15/20  Yes Yailine Ballard, Canary Brimhristopher J, MD  predniSONE (DELTASONE) 10 MG tablet Take 6 tablets (60 mg total) by mouth daily for 1 day, THEN 5 tablets (50 mg total) daily for 1 day, THEN 4 tablets (40 mg total) daily for 1 day, THEN 3 tablets (30 mg total) daily for 1  day, THEN 2 tablets (20 mg total) daily for 1 day, THEN 1 tablet (10 mg total) daily for 1 day. 10/15/20 10/21/20 Yes Endi Lagman, Canary Brim, MD  acetaminophen (TYLENOL) 500 MG tablet Take 1,000 mg by mouth every 6 (six) hours as needed for moderate pain.    [provider]  amoxicillin-clavulanate (AUGMENTIN) 875-125 MG tablet Take 1 tablet by mouth 2 (two) times daily. 04/25/18   Demetrio Lapping, PA-C  benzonatate (TESSALON) 100 MG capsule Take 1-2 capsules (100-200 mg total) by mouth 3 (three) times daily as needed for cough. 10/09/20   Lurene Shadow, PA-C  butorphanol (STADOL) 10 MG/ML nasal spray PLACE 1 SPRAY IN ONE NOSTRIL AS DIRECTED EVERY 6 HOURS IF NEEDED 09/05/20 03/04/21  Mitzi Hansen, NP  chlorproMAZINE (THORAZINE) 25 MG tablet Take 1 tablet (25 mg total)  by mouth as needed (take one tablet as needed for Headache). 08/14/19   Glyn Ade, Scot Jun, PA-C  clobetasol ointment (TEMOVATE) 0.05 % APPLY EXTERNALLY TO AFFECTED AREA(S) TWICE DAILY 05/02/20 05/02/21  Axner, Lowanda Foster, PA-C  clonazePAM (KLONOPIN) 0.5 MG tablet Take 0.25 mg by mouth at bedtime as needed.     [provider]  cyclobenzaprine (FLEXERIL) 10 MG tablet Take 1 tablet (10 mg total) by mouth 3 (three) times daily as needed for muscle spasms. 03/31/19   Glyn Ade, Scot Jun, PA-C  diphenhydrAMINE (BENADRYL) 50 MG tablet Take 50 mg by mouth as needed for itching.    [provider]  escitalopram (LEXAPRO) 10 MG tablet Take 10 mg by mouth at bedtime.    [provider]  FIBER ADULT GUMMIES PO Take 2 each by mouth daily.    [provider]  fluticasone (FLONASE) 50 MCG/ACT nasal spray Place 2 sprays into both nostrils daily. 09/23/17   Waldon Merl, PA-C  fluticasone (FLONASE) 50 MCG/ACT nasal spray Place 2 sprays into both nostrils daily. 04/25/18   Demetrio Lapping, PA-C  HYDROcodone-acetaminophen (NORCO/VICODIN) 5-325 MG tablet Take 1 tablet by mouth as needed for moderate pain.    [provider]  hydrOXYzine (ATARAX/VISTARIL) 10 MG tablet TAKE 1 TABLET BY MOUTH BEFORE BEDTIME 05/02/20 05/02/21  Axner, Lowanda Foster, PA-C  Insulin Pen Needle 31G X 6 MM MISC USE AS DIRECTED WITH SAXENDA 09/06/20 09/06/21  Mitzi Hansen, NP  ipratropium (ATROVENT) 0.03 % nasal spray 2 sprays per nostril 2-3 times daily as needed for congestion and post-nasal drainage 10/09/20   Lurene Shadow, PA-C  Lasmiditan Succinate (REYVOW) 100 MG TABS Take 100 mg by mouth as needed. 08/11/19   Teague Chestine Spore, Scot Jun, PA-C  levalbuterol [facility] HFA) 45 MCG/ACT inhaler Inhale 2 puffs into the lungs every 4 (four) hours as needed for wheezing.    [provider]  levonorgestrel (MIRENA) 20 MCG/24HR IUD 1 each by Intrauterine route once.    [provider]   Liraglutide -Weight Management 18 MG/3ML SOPN INJECT 0.6MG  UNDER THE SKIN ONCE/DAY X 7 DAYS, INCREASE BY 0.6 MG WEEKLY TO TARGET DOSE OF 3 MG DAILY 09/30/20 09/30/21  Mitzi Hansen, NP  Liraglutide -Weight Management 18 MG/3ML SOPN INJECT 0.6MG  UNDER THE SKIN ONCE A DAY FOR 7 DAYS INCREASE 0.6 MG WEEKLY TO TARGET DOSE OF 3MG  DAILY 09/06/20 09/06/21  09/08/21, NP  metFORMIN (GLUCOPHAGE) 500 MG tablet Take 1,000 mg by mouth 2 (two) times daily. 01/26/19   [provider]  montelukast (SINGULAIR) 10 MG tablet montelukast 10 mg tablet  TAKE 1 TABLET BY MOUTH DAILY  [provider]  Multiple Vitamins-Minerals (MULTIVITAMIN WITH MINERALS) tablet Take 1 tablet by mouth daily.    [provider]  naproxen (NAPROSYN) 500 MG tablet Take 1 tablet (500 mg total) by mouth 2 (two) times daily as needed for moderate pain. 08/20/17   Bovard-Stuckert, Augusto Gamble, MD  ondansetron (ZOFRAN) 4 MG tablet Take 1 tablet (4 mg total) by mouth every 6 (six) hours as needed for nausea. 02/14/17   Ovidio Kin, MD  oseltamivir (TAMIFLU) 75 MG capsule Take 1 capsule (75 mg total) by mouth 2 (two) times daily. 05/13/18   Withrow, Everardo All, FNP  oxyCODONE (ROXICODONE) 5 MG immediate release tablet Take 1 tablet (5 mg total) by mouth every 6 (six) hours as needed for severe pain. 08/20/17   Bovard-Stuckert, Augusto Gamble, MD  pantoprazole (PROTONIX) 20 MG tablet Take 1 tablet (20 mg total) by mouth daily. Patient taking differently: Take 20 mg by mouth at bedtime.  05/04/15   Barrett Henle, PA-C  promethazine (PHENERGAN) 25 MG tablet TAKE 1 TABLET (25 MG TOTAL) BY MOUTH EVERY 6 (SIX) HOURS AS NEEDED FOR NAUSEA OR VOMITING. 11/01/19   Glyn Ade, Scot Jun, PA-C  propranolol ER (INDERAL LA) 60 MG 24 hr capsule Take 1 capsule by mouth at bedtime.  01/28/17   [provider]  Rimegepant Sulfate (NURTEC) 75 MG TBDP Take 75 mg by mouth as needed (migraine). 03/31/19   Glyn Ade, Scot Jun, PA-C  rizatriptan  (MAXALT) 10 MG tablet Take 10 mg by mouth as needed for migraine. May repeat in 2 hours if needed     [provider]    Allergies    Tylenol with codeine #3 [acetaminophen-codeine] and Betadine [povidone iodine]  Review of Systems   Review of Systems  Respiratory: Positive for shortness of breath and wheezing.   Skin: Positive for rash.  All other systems reviewed and are negative.   Physical Exam Updated Vital Signs BP (!) 113/93   Pulse 88   Temp 98.7 F (37.1 C)   Resp 20   LMP 10/01/2020   SpO2 96%   Physical Exam Vitals and nursing note reviewed.  Constitutional:      General: She is not in acute distress.    Appearance: Normal appearance. She is well-developed.  HENT:     Head: Normocephalic and atraumatic.     Right Ear: Hearing normal.     Left Ear: Hearing normal.     Nose: Nose normal.  Eyes:     Conjunctiva/sclera: Conjunctivae normal.     Pupils: Pupils are equal, round, and reactive to light.  Cardiovascular:     Rate and Rhythm: Regular rhythm.     Heart sounds: S1 normal and S2 normal. No murmur heard. No friction rub. No gallop.   Pulmonary:     Effort: Pulmonary effort is normal. Tachypnea present. No respiratory distress.     Breath sounds: Normal breath sounds.  Chest:     Chest wall: No tenderness.  Abdominal:     General: Bowel sounds are normal.     Palpations: Abdomen is soft.     Tenderness: There is no abdominal tenderness. There is no guarding or rebound. Negative signs include Murphy's sign and McBurney's sign.     Hernia: No hernia is present.  Musculoskeletal:        General: Normal range of motion.     Cervical back: Normal range of motion and neck supple.  Skin:    General: Skin is warm and  dry.     Findings: Rash present.  Neurological:     Mental Status: She is alert and oriented to person, place, and time.     GCS: GCS eye subscore is 4. GCS verbal subscore is 5. GCS motor subscore is 6.     Cranial Nerves: No  cranial nerve deficit.     Sensory: No sensory deficit.     Coordination: Coordination normal.  Psychiatric:        Speech: Speech normal.        Behavior: Behavior normal.        Thought Content: Thought content normal.     ED Results / Procedures / Treatments   Labs (all labs ordered are listed, but only abnormal results are displayed) Labs Reviewed - No data to display  EKG None  Radiology No results found.  Procedures Procedures   Medications Ordered in ED Medications  famotidine (PEPCID) IVPB 20 mg premix (20 mg Intravenous Not Given 10/15/20 0246)  dexamethasone (DECADRON) injection 10 mg (10 mg Intravenous Given 10/15/20 0244)  EPINEPHrine (EPI-PEN) injection 0.3 mg (0.3 mg Intramuscular Given 10/15/20 0243)  famotidine (PEPCID) 20-0.9 MG/50ML-% IVPB (  Stopped 10/15/20 0314)  ipratropium-albuterol (DUONEB) 0.5-2.5 (3) MG/3ML nebulizer solution 3 mL (3 mLs Nebulization Given 10/15/20 8242)    ED Course  I have reviewed the triage vital signs and the nursing notes.  Pertinent labs & imaging results that were available during my care of the patient were reviewed by me and considered in my medical decision making (see chart for details).    MDM Rules/Calculators/A&P                          Patient presents to the emergency department for evaluation of allergic reaction.  Patient thinks she was exposed to mushrooms which she has a known allergy to.  She is experiencing tightness in her chest, shortness of breath, wheezing and rash.  She did take 75 mg of Benadryl before coming to the ER and used albuterol but is still fairly symptomatic.  Patient therefore administered subcu epi and has been monitored.  She is doing well.  Wheezing has resolved with an additional nebulizer treatment.  Rash is resolving.  Will discharge with scheduled dose of antihistamine and based on her previous asthma and wheezing, will also prescribe tapering dose of prednisone.  Final Clinical Impression(s)  / ED Diagnoses Final diagnoses:  Allergic reaction, initial encounter    Rx / DC Orders ED Discharge Orders         Ordered    predniSONE (DELTASONE) 10 MG tablet        10/15/20 0433    EPINEPHrine 0.3 mg/0.3 mL IJ SOAJ injection  As needed        10/15/20 0433           Gilda Crease, MD 10/15/20 [phone]

## 2020-10-15 NOTE — ED Triage Notes (Signed)
Patient arrives with SOB. Patient states she is allergic to mushrooms and ate a sub she thinks may have had mushrooms on it. Patient took 75mg  of benadryl before arrival.

## 2020-10-15 NOTE — ED Notes (Signed)
Patient still feeling tight in chest and wheezing. RT and provider notified. See orders.

## 2020-10-15 NOTE — ED Notes (Signed)
Ambulated to BR, gait steady. States," I feel much better"

## 2020-10-17 ENCOUNTER — Inpatient Hospital Stay (HOSPITAL_BASED_OUTPATIENT_CLINIC_OR_DEPARTMENT_OTHER)
Admission: EM | Admit: 2020-10-17 | Discharge: 2020-10-19 | DRG: 916 | Disposition: A | Discharge status: Home / Self Care | Payer: 59 | Attending: Internal Medicine | Admitting: Internal Medicine

## 2020-10-17 ENCOUNTER — Encounter (HOSPITAL_BASED_OUTPATIENT_CLINIC_OR_DEPARTMENT_OTHER): Payer: Self-pay

## 2020-10-17 ENCOUNTER — Emergency Department (HOSPITAL_BASED_OUTPATIENT_CLINIC_OR_DEPARTMENT_OTHER): Payer: 59

## 2020-10-17 ENCOUNTER — Other Ambulatory Visit: Payer: Self-pay

## 2020-10-17 DIAGNOSIS — J45901 Unspecified asthma with (acute) exacerbation: Secondary | ICD-10-CM | POA: Diagnosis present

## 2020-10-17 DIAGNOSIS — Z888 Allergy status to other drugs, medicaments and biological substances status: Secondary | ICD-10-CM

## 2020-10-17 DIAGNOSIS — R9431 Abnormal electrocardiogram [ECG] [EKG]: Secondary | ICD-10-CM | POA: Diagnosis not present

## 2020-10-17 DIAGNOSIS — F32A Depression, unspecified: Secondary | ICD-10-CM | POA: Diagnosis not present

## 2020-10-17 DIAGNOSIS — F419 Anxiety disorder, unspecified: Secondary | ICD-10-CM

## 2020-10-17 DIAGNOSIS — Z885 Allergy status to narcotic agent status: Secondary | ICD-10-CM | POA: Diagnosis not present

## 2020-10-17 DIAGNOSIS — E66813 Obesity, class 3: Secondary | ICD-10-CM

## 2020-10-17 DIAGNOSIS — G43009 Migraine without aura, not intractable, without status migrainosus: Secondary | ICD-10-CM | POA: Diagnosis present

## 2020-10-17 DIAGNOSIS — Z79899 Other long term (current) drug therapy: Secondary | ICD-10-CM

## 2020-10-17 DIAGNOSIS — Z9102 Food additives allergy status: Secondary | ICD-10-CM

## 2020-10-17 DIAGNOSIS — R059 Cough, unspecified: Secondary | ICD-10-CM | POA: Diagnosis not present

## 2020-10-17 DIAGNOSIS — K219 Gastro-esophageal reflux disease without esophagitis: Secondary | ICD-10-CM | POA: Diagnosis present

## 2020-10-17 DIAGNOSIS — I10 Essential (primary) hypertension: Secondary | ICD-10-CM | POA: Diagnosis present

## 2020-10-17 DIAGNOSIS — T782XXA Anaphylactic shock, unspecified, initial encounter: Principal | ICD-10-CM | POA: Diagnosis present

## 2020-10-17 DIAGNOSIS — Z6841 Body Mass Index (BMI) 40.0 and over, adult: Secondary | ICD-10-CM

## 2020-10-17 DIAGNOSIS — T7840XA Allergy, unspecified, initial encounter: Secondary | ICD-10-CM | POA: Diagnosis present

## 2020-10-17 DIAGNOSIS — R0602 Shortness of breath: Secondary | ICD-10-CM | POA: Diagnosis not present

## 2020-10-17 DIAGNOSIS — Z20822 Contact with and (suspected) exposure to covid-19: Secondary | ICD-10-CM | POA: Diagnosis not present

## 2020-10-17 LAB — RESP PANEL BY RT-PCR (FLU A&B, COVID) ARPGX2
Influenza A by PCR: NEGATIVE
Influenza B by PCR: NEGATIVE
SARS Coronavirus 2 by RT PCR: NEGATIVE

## 2020-10-17 LAB — CBC WITH DIFFERENTIAL/PLATELET
Abs Immature Granulocytes: 0.29 10*3/uL — ABNORMAL HIGH (ref 0.00–0.07)
Basophils Absolute: 0 10*3/uL (ref 0.0–0.1)
Basophils Relative: 0 %
Eosinophils Absolute: 0 10*3/uL (ref 0.0–0.5)
Eosinophils Relative: 0 %
HCT: 39.4 % (ref 36.0–46.0)
Hemoglobin: 13.5 g/dL (ref 12.0–15.0)
Immature Granulocytes: 2 %
Lymphocytes Relative: 6 %
Lymphs Abs: 1.2 10*3/uL (ref 0.7–4.0)
MCH: 29.1 pg (ref 26.0–34.0)
MCHC: 34.3 g/dL (ref 30.0–36.0)
MCV: 84.9 fL (ref 80.0–100.0)
Monocytes Absolute: 0.3 10*3/uL (ref 0.1–1.0)
Monocytes Relative: 2 %
Neutro Abs: 17.2 10*3/uL — ABNORMAL HIGH (ref 1.7–7.7)
Neutrophils Relative %: 90 %
Platelets: 357 10*3/uL (ref 150–400)
RBC: 4.64 MIL/uL (ref 3.87–5.11)
RDW: 12.8 % (ref 11.5–15.5)
WBC: 19.1 10*3/uL — ABNORMAL HIGH (ref 4.0–10.5)
nRBC: 0 % (ref 0.0–0.2)

## 2020-10-17 LAB — BASIC METABOLIC PANEL
Anion gap: 9 (ref 5–15)
BUN: 17 mg/dL (ref 6–20)
CO2: 20 mmol/L — ABNORMAL LOW (ref 22–32)
Calcium: 8.7 mg/dL — ABNORMAL LOW (ref 8.9–10.3)
Chloride: 108 mmol/L (ref 98–111)
Creatinine, Ser: 0.82 mg/dL (ref 0.44–1.00)
GFR, Estimated: >60 mL/min (ref >60)
Glucose, Bld: 143 mg/dL — ABNORMAL HIGH (ref 70–99)
Potassium: 3.9 mmol/L (ref 3.5–5.1)
Sodium: 137 mmol/L (ref 135–145)

## 2020-10-17 LAB — HCG, SERUM, QUALITATIVE: Preg, Serum: NEGATIVE

## 2020-10-17 MED ORDER — IPRATROPIUM BROMIDE 0.02 % IN SOLN
0.5000 mg | RESPIRATORY_TRACT | Status: DC
  Administered 2020-10-17 – 2020-10-19 (×10): 0.5 mg via RESPIRATORY_TRACT
  Filled 2020-10-17 (×10): qty 2.5

## 2020-10-17 MED ORDER — CHLORPROMAZINE HCL 25 MG PO TABS
25.0000 mg | ORAL_TABLET | Freq: Every day | ORAL | Status: DC | PRN
  Administered 2020-10-17: 25 mg via ORAL
  Filled 2020-10-17 (×2): qty 1

## 2020-10-17 MED ORDER — IPRATROPIUM-ALBUTEROL 0.5-2.5 (3) MG/3ML IN SOLN
3.0000 mL | Freq: Once | RESPIRATORY_TRACT | Status: AC
  Administered 2020-10-17: 3 mL via RESPIRATORY_TRACT
  Filled 2020-10-17: qty 3

## 2020-10-17 MED ORDER — ENOXAPARIN SODIUM 40 MG/0.4ML ~~LOC~~ SOLN
40.0000 mg | SUBCUTANEOUS | Status: DC
  Administered 2020-10-17 – 2020-10-18 (×2): 40 mg via SUBCUTANEOUS
  Filled 2020-10-17 (×2): qty 0.4

## 2020-10-17 MED ORDER — PANTOPRAZOLE SODIUM 20 MG PO TBEC
20.0000 mg | DELAYED_RELEASE_TABLET | Freq: Every day | ORAL | Status: DC
  Administered 2020-10-18: 20 mg via ORAL
  Filled 2020-10-17: qty 1

## 2020-10-17 MED ORDER — ALBUTEROL (5 MG/ML) CONTINUOUS INHALATION SOLN: 2.5000 mg/h | INHALATION_SOLUTION | Freq: Once | RESPIRATORY_TRACT | Status: DC

## 2020-10-17 MED ORDER — PANTOPRAZOLE SODIUM 20 MG PO TBEC
20.0000 mg | DELAYED_RELEASE_TABLET | Freq: Every day | ORAL | Status: DC
  Filled 2020-10-17: qty 1

## 2020-10-17 MED ORDER — LEVALBUTEROL HCL 1.25 MG/0.5ML IN NEBU
1.2500 mg | INHALATION_SOLUTION | RESPIRATORY_TRACT | Status: DC
  Administered 2020-10-17 – 2020-10-19 (×10): 1.25 mg via RESPIRATORY_TRACT
  Filled 2020-10-17 (×12): qty 0.5

## 2020-10-17 MED ORDER — CLONAZEPAM 0.125 MG PO TBDP
0.2500 mg | ORAL_TABLET | Freq: Every evening | ORAL | Status: DC | PRN
  Administered 2020-10-18: 0.25 mg via ORAL
  Filled 2020-10-17: qty 2

## 2020-10-17 MED ORDER — LABETALOL HCL 100 MG PO TABS
200.0000 mg | ORAL_TABLET | Freq: Two times a day (BID) | ORAL | Status: DC
  Administered 2020-10-17 – 2020-10-19 (×4): 200 mg via ORAL
  Filled 2020-10-17 (×4): qty 2

## 2020-10-17 MED ORDER — ESCITALOPRAM OXALATE 20 MG PO TABS
20.0000 mg | ORAL_TABLET | Freq: Every day | ORAL | Status: DC
  Administered 2020-10-18 – 2020-10-19 (×2): 20 mg via ORAL
  Filled 2020-10-17 (×2): qty 1

## 2020-10-17 MED ORDER — METHYLPREDNISOLONE SODIUM SUCC 125 MG IJ SOLR: 60.0000 mg | Freq: Every day | INTRAMUSCULAR | Status: DC

## 2020-10-17 MED ORDER — BENZONATATE 100 MG PO CAPS
200.0000 mg | ORAL_CAPSULE | Freq: Three times a day (TID) | ORAL | Status: DC | PRN
  Administered 2020-10-17 – 2020-10-19 (×4): 200 mg via ORAL
  Filled 2020-10-17 (×4): qty 2

## 2020-10-17 MED ORDER — ALBUTEROL (5 MG/ML) CONTINUOUS INHALATION SOLN
10.0000 mg/h | INHALATION_SOLUTION | Freq: Once | RESPIRATORY_TRACT | Status: AC
  Administered 2020-10-17: 10 mg/h via RESPIRATORY_TRACT
  Filled 2020-10-17: qty 20

## 2020-10-17 MED ORDER — METHYLPREDNISOLONE SODIUM SUCC 125 MG IJ SOLR
60.0000 mg | Freq: Two times a day (BID) | INTRAMUSCULAR | Status: DC
  Administered 2020-10-17 – 2020-10-19 (×4): 60 mg via INTRAVENOUS
  Filled 2020-10-17 (×4): qty 2

## 2020-10-17 NOTE — Progress Notes (Signed)
BP 135/61 (BP Location: Right Arm)   Pulse (!) 103   Temp 99.2 F (37.3 C) (Oral)   Resp 18   Ht 5\' 1"  (1.549 m)   Wt 104.3 kg   LMP 09/26/2020   SpO2 100%   BMI 43.46 kg/m   35 y/o with PMH of allergic reaction came in to the ED 2 days ago with rash, wheezing and SOB that responded to steroids, benadryl and albuterol and epi pen, d/c home on benadryl comes back complaining of SOB and wheezing sat 100% on RA able to swallow. Cont on steroids, benadryl and albuterol and [facility] ED, place in obsevration admitted to telemetry.

## 2020-10-17 NOTE — ED Triage Notes (Signed)
Pt arrived via EMS from [facility] after having an allergic reaction. Was given an epi injection IM and 2 doses of SQ Adrenaline.  0930, pt given Benadryl, Solu-medrol, and pepcid. And 800 cc of fluid.   VSS, oxygen 98% on RA. Still experiencing some coughing and SOB. On cardiac monitor.

## 2020-10-17 NOTE — H&P (Signed)
History and Physical    [name] [name] FAO:[id number] DOB: [birth year] DOA: 10/17/2020  PCP: Joycelyn Rua, MD  Patient coming from: [facility] ED   I have personally briefly reviewed patient's old medical records in [facility]  Chief Complaint: increasing shortness of breath, hoarseness, chest pressure  HPI: [name] [name] is a 35 y.o. female with medical history significant for Migraine, asthma, HTN, GERD, IBS, ulcerative colitis, Anxiety/depression who presents from [facility] ED to [facility] for anaphylactic allergic reaction.   She was evaluated in the ED on 10/15/20 for rash, itching, wheezing and shortness of breath after eating a sub that potentially had mushrooms which is a known allergen. She was given IM Epi, Decadron, Pepcid and duo-neb in ED with improvement and discharged home with steroid taper and Epi-pen. Also taking daily Zyrtec.  She presented to urgent care today for allergic reaction again with chest tightness, hoarseness of voice, shortness of breath and rash. Nothing new other than using Raid in her lawn this morning. Denies any new medications. She gave herself auto-injector of Epi at home, received another at urgent along with  Solu-Medrol, antihistamines Benadryl and Pepcid, and 2 g of magnesium prior to arrival to ED. In the ED, she was given an additional Epi,  duo-neb and continuous albuterol. Continuous albuterol later discontinued due to tachycardia and pt anxiety.    WBC of 19. CBC, BMP unremarkable. CXR negative.   On arrival to [facility], patient was hemodynamically stable but continues to have hoarseness of voice and states that she is having some increasing chest tightness and shortness of breath.  Denies any difficulty swallowing or throat closing sensation.  Patient is hungry and is requesting food.  Also wanted something for cough.  Review of Systems: Constitutional: No Weight Change, No Fever ENT/Mouth: No sore throat, No Rhinorrhea Eyes: No Eye Pain, No  Vision Changes Cardiovascular: + Chest Pain, +SOB, No Dyspnea on Exertion,No Edema, No Palpitations Respiratory: + Cough, No Sputum, + Wheezing,+Dyspnea  Gastrointestinal: No Nausea, No Vomiting, No Diarrhea, No Constipation, No Pain Genitourinary: no Urinary Incontinence Musculoskeletal: No Arthralgias, No Myalgias Skin: No Skin Lesions, + Pruritus, Neuro: no Weakness, No Numbness Psych: No Anxiety/Panic, No Depression, no decrease appetite Heme/Lymph: No Bruising, No Bleeding  Past Medical History:  Diagnosis Date  . Anemia   . Anxiety   . Arthritis    knee  . Asthma   . Back pain    lower back  . Colitis   . Complication of anesthesia   . Depression   . Diverticulitis   . Endometriosis 08/20/2017  . GERD (gastroesophageal reflux disease)   . History of kidney stones    passed stones-no surgery required  . Migraine   . Nerve damage    left femoral nerve  . 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 PONV (postoperative nausea and vomiting)   . Seasonal allergies   . Seizures (HCC)    febrile seizure as a child, no problems as aduilt  . Ulcerative colitis [facility])     Past Surgical History:  Procedure Laterality Date  . BOWEL RESECTION  02/10/2017  . BOWEL RESECTION    . CHOLECYSTECTOMY    . COLONOSCOPY    . 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 KNEE SURGERY     left knee  . LAPAROSCOPIC LYSIS OF ADHESIONS N/A 08/20/2017   Procedure: EXTENSIVE LAPAROSCOPIC LYSIS OF ADHESIONS;  Surgeon: Sherian Rein, MD;  Location: [facility] ORS;  Service: Gynecology;  Laterality: N/A;  . LAPAROSCOPY N/A 08/20/2017   Procedure: LAPAROSCOPY OPERATIVE, LEFT OVARIAN CYSTECTOMY, RIGHT PARATUBAL CYSTECTOMY;  Surgeon: Sherian Rein, MD;  Location: [facility] ORS;  Service: Gynecology;  Laterality: N/A;  . UPPER GI ENDOSCOPY    . WISDOM TOOTH EXTRACTION       reports that she has never smoked. She has never used smokeless tobacco. She reports current alcohol  use. She reports that she does not use drugs. Social History  Allergies  Allergen Reactions  . Mushroom Extract Complex Anaphylaxis  . Tylenol With Codeine #3 [Acetaminophen-Codeine] Palpitations  . Oxycodone     Other reaction(s): Flushing  . Betadine [Povidone Iodine] Rash    History reviewed. No pertinent family history.   Prior to Admission medications   Medication Sig Start Date End Date Taking? Authorizing Provider  acetaminophen (TYLENOL) 500 MG tablet Take 1,000 mg by mouth every 6 (six) hours as needed for headache.   Yes [provider]  benzonatate (TESSALON) 100 MG capsule Take 1-2 capsules (100-200 mg total) by mouth 3 (three) times daily as needed for cough. Patient taking differently: Take 200 mg by mouth 3 (three) times daily as needed for cough. 10/09/20  Yes Phelps, Erin O, PA-C  butorphanol (STADOL) 10 MG/ML nasal spray PLACE 1 SPRAY IN ONE NOSTRIL AS DIRECTED EVERY 6 HOURS IF NEEDED Patient taking differently: Place 1 spray into the nose every 4 (four) hours as needed for migraine. 09/05/20 03/04/21 Yes Mitzi Hansen, NP  cetirizine (ZYRTEC) 10 MG tablet Take 10 mg by mouth daily.   Yes [provider]  chlorproMAZINE (THORAZINE) 25 MG tablet Take 1 tablet (25 mg total) by mouth as needed (take one tablet as needed for Headache). Patient taking differently: Take 25 mg by mouth daily as needed (take one tablet as needed for Headache). 08/14/19  Yes Teague Edwena Blow, PA-C  clobetasol ointment (TEMOVATE) 0.05 % APPLY EXTERNALLY TO AFFECTED AREA(S) TWICE DAILY Patient taking differently: Apply 1 application topically daily as needed (infection). 05/02/20 05/02/21 Yes Axner, Lowanda Foster, PA-C  clonazePAM (KLONOPIN) 0.5 MG tablet Take 0.25 mg by mouth at bedtime as needed for anxiety.   Yes [provider]  cyclobenzaprine (FLEXERIL) 10 MG tablet Take 1 tablet (10 mg total) by mouth 3 (three) times daily as needed for muscle spasms. 03/31/19  Yes  Teague Edwena Blow, PA-C  diphenhydrAMINE (BENADRYL) 50 MG tablet Take 50 mg by mouth daily as needed for itching.   Yes [provider]  EPINEPHrine 0.3 mg/0.3 mL IJ SOAJ injection Inject 0.3 mg into the muscle as needed for anaphylaxis. Patient taking differently: Inject 0.3 mg into the muscle once as needed for anaphylaxis. 10/15/20  Yes Pollina, Canary Brim, MD  escitalopram (LEXAPRO) 20 MG tablet Take 20 mg by mouth daily.   Yes [provider]  hydrOXYzine (ATARAX/VISTARIL) 10 MG tablet TAKE 1 TABLET BY MOUTH BEFORE BEDTIME Patient taking differently: Take 10 mg by mouth every 8 (eight) hours as needed for itching or anxiety. 05/02/20 05/02/21 Yes Axner, Lowanda Foster, PA-C  ibuprofen (ADVIL) 200 MG tablet Take 800 mg by mouth every 6 (six) hours as needed for headache.   Yes [provider]  labetalol (NORMODYNE) 200 MG tablet Take 200 mg by mouth 2 (two) times daily.   Yes [provider]  levalbuterol (XOPENEX HFA) 45 MCG/ACT inhaler Inhale 2 puffs into the lungs every 4 (four) hours as needed for wheezing.   Yes [provider]  Liraglutide -Weight Management 18 MG/3ML SOPN INJECT 0.6MG  UNDER THE SKIN ONCE/DAY X 7 DAYS, INCREASE BY 0.6 MG WEEKLY TO TARGET DOSE OF 3 MG DAILY Patient taking differently: Inject 1.2 mg into the skin daily. 09/30/20 09/30/21 Yes Mitzi Hansen, NP  pantoprazole (PROTONIX) 20 MG tablet Take 1 tablet (20 mg total) by mouth daily. Patient taking differently: Take 20 mg by mouth at bedtime. 05/04/15  Yes Barrett Henle, PA-C  predniSONE (DELTASONE) 10 MG tablet Take 6 tablets (60 mg total) by mouth daily for 1 day, THEN 5 tablets (50 mg total) daily for 1 day, THEN 4 tablets (40 mg total) daily for 1 day, THEN 3 tablets (30 mg total) daily for 1 day, THEN 2 tablets (20 mg total) daily for 1 day, THEN 1 tablet (10 mg total) daily for 1 day. 10/15/20 10/21/20 Yes Pollina, Canary Brim, MD  promethazine (PHENERGAN) 25 MG  tablet TAKE 1 TABLET (25 MG TOTAL) BY MOUTH EVERY 6 (SIX) HOURS AS NEEDED FOR NAUSEA OR VOMITING. 11/01/19  Yes Teague Edwena Blow, PA-C  Rimegepant Sulfate (NURTEC) 75 MG TBDP Take 75 mg by mouth as needed (migraine). Patient taking differently: Take 75 mg by mouth daily as needed (migraine). 03/31/19  Yes Teague Edwena Blow, PA-C  TOSYMRA 10 MG/ACT SOLN Inject 10 mg into the skin daily as needed (migraine). 09/04/20  Yes [provider]  traMADol (ULTRAM) 50 MG tablet Take 50 mg by mouth 2 (two) times daily as needed for moderate pain. 08/20/20  Yes [provider]  amoxicillin-clavulanate (AUGMENTIN) 875-125 MG tablet Take 1 tablet by mouth 2 (two) times daily. Patient not taking: No sig reported 04/25/18   Illa Level M, PA-C  fluticasone [facility]) 50 MCG/ACT nasal spray Place 2 sprays into both nostrils daily. Patient not taking: Reported on 10/17/2020 09/23/17   Waldon Merl, PA-C  fluticasone [facility]) 50 MCG/ACT nasal spray Place 2 sprays into both nostrils daily. Patient not taking: No sig reported 04/25/18   Demetrio Lapping, PA-C  Insulin Pen Needle 31G X 6 MM MISC USE AS DIRECTED WITH SAXENDA 09/06/20 09/06/21  Mitzi Hansen, NP  ipratropium (ATROVENT) 0.03 % nasal spray 2 sprays per nostril 2-3 times daily as needed for congestion and post-nasal drainage Patient not taking: No sig reported 10/09/20   Lurene Shadow, PA-C  Lasmiditan Succinate (REYVOW) 100 MG TABS Take 100 mg by mouth as needed. Patient not taking: No sig reported 08/11/19   Glyn Ade, Scot Jun, PA-C  Liraglutide -Weight Management 18 MG/3ML SOPN INJECT 0.6MG  UNDER THE SKIN ONCE A DAY FOR 7 DAYS INCREASE 0.6 MG WEEKLY TO TARGET DOSE OF 3MG  DAILY Patient not taking: No sig reported 09/06/20 09/06/21  09/08/21, NP  metFORMIN (GLUCOPHAGE) 500 MG tablet Take 1,000 mg by mouth 2 (two) times daily. 01/26/19   [provider]  naproxen (NAPROSYN) 500 MG tablet Take 1 tablet (500 mg total) by  mouth 2 (two) times daily as needed for moderate pain. Patient not taking: No sig reported 08/20/17   Bovard-Stuckert, Jody, MD  ondansetron (ZOFRAN) 4 MG tablet Take 1 tablet (4 mg total) by mouth every 6 (six) hours as needed for nausea. Patient not taking: Reported on 10/17/2020 02/14/17   04/16/17, MD  oxyCODONE (ROXICODONE) 5 MG immediate release tablet Take 1 tablet (5 mg total) by mouth every 6 (six) hours as needed for severe pain. Patient not taking: No sig reported 08/20/17   10/18/17, MD  propranolol ER (INDERAL LA) 60 MG 24 hr capsule Take 1 capsule by mouth at bedtime.  Patient not taking: Reported on 10/17/2020 01/28/17   [provider]    Physical Exam: Vitals:   10/17/20 1558 10/17/20 1700 10/17/20 1800 10/17/20 1850  BP: 135/61 138/88 136/70 131/78  Pulse: (!) 103 90 88 94  Resp: 18 18 (!) 31 18  Temp:    98.8 F (37.1 C)  TempSrc:    Oral  SpO2: 100% 99% 99% 99%  Weight:      Height:        Constitutional: NAD, anxious young female sitting upright in bed Vitals:   10/17/20 1558 10/17/20 1700 10/17/20 1800 10/17/20 1850  BP: 135/61 138/88 136/70 131/78  Pulse: (!) 103 90 88 94  Resp: 18 18 (!) 31 18  Temp:    98.8 F (37.1 C)  TempSrc:    Oral  SpO2: 100% 99% 99% 99%  Weight:      Height:       Eyes: PERRL, lids and conjunctivae normal ENMT: Mucous membranes are moist. Posterior pharynx clear of any exudate or lesions or edema.Normal dentition. No angioedema.  Neck: normal, supple Respiratory: mild faint anterior wheezing with hoarseness of voice.  Shortness of breath when speaking sentences.  No accessory muscle use.  Cardiovascular: Regular rate and rhythm, no murmurs / rubs / gallops. No extremity edema.  Abdomen: no tenderness, no masses palpated.  Bowel sounds positive.  Musculoskeletal: no clubbing / cyanosis. No joint deformity upper and lower extremities. Good ROM, no contractures. Normal muscle tone.  Skin: Erythematous rash on  anterior chest and bilateral bicep region of upper extremity Neurologic: CN 2-12 grossly intact. Sensation intact,  Strength 5/5 in all 4.  Psychiatric: Normal judgment and insight. Alert and oriented x 3. Normal mood.     Labs on Admission: I have personally reviewed following labs and imaging studies  CBC: Recent Labs  Lab 10/17/20 1235  WBC 19.1*  NEUTROABS 17.2*  HGB 13.5  HCT 39.4  MCV 84.9  PLT 357   Basic Metabolic Panel: Recent Labs  Lab 10/17/20 1235  NA 137  K 3.9  CL 108  CO2 20*  GLUCOSE 143*  BUN 17  CREATININE 0.82  CALCIUM 8.7*   GFR: Estimated Creatinine Clearance: 107.4 mL/min (by C-G formula based on SCr of 0.82 mg/dL). Liver Function Tests: No results for input(s): AST, ALT, ALKPHOS, BILITOT, PROT, ALBUMIN in the last 168 hours. No results for input(s): LIPASE, AMYLASE in the last 168 hours. No results for input(s): AMMONIA in the last 168 hours. Coagulation Profile: No results for input(s): INR, PROTIME in the last 168 hours. Cardiac Enzymes: No results for input(s): CKTOTAL, CKMB, CKMBINDEX, TROPONINI in the last 168 hours. BNP (last 3 results) No results for input(s): PROBNP in the last 8760 hours. HbA1C: No results for input(s): HGBA1C in the last 72 hours. CBG: No results for input(s): GLUCAP in the last 168 hours. Lipid Profile: No results for input(s): CHOL, HDL, LDLCALC, TRIG, CHOLHDL, LDLDIRECT in the last 72 hours. Thyroid Function Tests: No results for input(s): TSH, T4TOTAL, FREET4, T3FREE, THYROIDAB in the last 72 hours. Anemia Panel: No results for input(s): VITAMINB12, FOLATE, FERRITIN, TIBC, IRON, RETICCTPCT in the last 72 hours. Urine analysis:    Component Value Date/Time   COLORURINE YELLOW 05/18/2016 1130   APPEARANCEUR CLEAR 05/18/2016 1130   LABSPEC 1.008 05/18/2016 1130   PHURINE 6.5 05/18/2016 1130   GLUCOSEU NEGATIVE 05/18/2016 1130  HGBUR NEGATIVE 05/18/2016 1130   BILIRUBINUR NEGATIVE 05/18/2016 1130    KETONESUR NEGATIVE 05/18/2016 1130   PROTEINUR NEGATIVE 05/18/2016 1130   UROBILINOGEN 0.2 05/04/2015 0830   NITRITE NEGATIVE 05/18/2016 1130   LEUKOCYTESUR TRACE (A) 05/18/2016 1130    Radiological Exams on Admission: DG Chest Portable 1 View  Result Date: 10/17/2020 CLINICAL DATA:  Shortness of breath with cough EXAM: PORTABLE CHEST 1 VIEW COMPARISON:  None. FINDINGS: Lungs are clear. Heart size and pulmonary vascularity are normal. No adenopathy. No bone lesions. IMPRESSION: Lungs clear.  Cardiac silhouette normal. Electronically Signed   By: Bretta BangWilliam  Woodruff III M.D.   On: 10/17/2020 12:38      Assessment/Plan  Biphasic anaphylaxis reaction  Likely also has a component of asthma exacerbation- pt states asthma is well controlled and usually only triggered by seasonal changes Continue BID 60mg  Solu-Medrol, scheduled q6hr Benadryl, scheduled q4hr ipratropium-Xopenex  Epi 0.3mg  PRN, consider glucagon if refractory since patient is on beta-blocker  HTN Continue labetalol  GERD Continue PPI  Anxiety/Depression Continue Lexapro, Clonazepam PRN   Hx of Migraine  Pt has multiple PRN-continue PRN Thorazine for now   Morbid obesity BMI greater than 40 Patient currently on Saxenda -will hold for now  DVT prophylaxis:.Lovenox Code Status: Full Family Communication: Plan discussed with patient, husband, mother at bedside  disposition Plan: Home with observation Consults called:  Admission status: Observation  Level of care: Telemetry  Status is: Observation  The patient remains OBS appropriate and will d/c before 2 midnights.  Dispo: The patient is from: Home              Anticipated d/c is to: Home              Patient currently is not medically stable to d/c.   Difficult to place patient No         Anselm Junglinghing T Anjani Feuerborn DO [facility]   If 7PM-7AM, please contact night-coverage [email or web address]   10/17/2020, 7:49 PM

## 2020-10-17 NOTE — ED Provider Notes (Signed)
[facility] EMERGENCY DEPARTMENT Provider Note   CSN: [id number] Arrival date & time: 10/17/20  1113     History Chief Complaint  Patient presents with  . Allergic Reaction    [name] [name] is a 35 y.o. female who [facility] after evaluation for allergic reaction.  Patient was seen in the emergency department 2 days ago for allergic reaction after accidentally ingesting mushrooms which is a known allergy.  She states that at that time her reaction resolved after administration of IM epi and she has been on prednisone in Zyrtec at home daily since that time.  She states that she was feeling well this morning after she utilized Raid at home to treat her lawn for hands.  Since that time she has experienced chest tightness, lip swelling, throat tightness, hoarseness of voice and loss of voice.  She states that she administered her EpiPen upon arrival to urgent care and EMS was called due to acuity of her presentation.  Prior to arrival to the ED, she was administered Benadryl, Solu-Medrol, oral Pepcid, 2 further doses of epinephrine, and 2 g of magnesium.  She endorses some brief improvement in her symptoms after each dose of epinephrine, and generalized improvement after magnesium.   Level 5 caveat due to acuity of patient's condition on presentation.  I personally reviewed this patient's medical records.  She has history of endometriosis, migraines, ulcerative colitis with bowel resection, seizures, and arthritis.  HPI     Past Medical History:  Diagnosis Date  . Anemia   . Anxiety   . Arthritis    knee  . Asthma   . Back pain    lower back  . Colitis   . Complication of anesthesia   . Depression   . Diverticulitis   . Endometriosis 08/20/2017  . GERD (gastroesophageal reflux disease)   . History of kidney stones    passed stones-no surgery required  . Migraine   . Nerve damage    left femoral nerve  . 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 PONV (postoperative nausea and vomiting)   . Seasonal allergies   . Seizures (HCC)    febrile seizure as a child, no problems as aduilt  . Ulcerative colitis [facility]     Patient Active Problem List   Diagnosis Date Noted  . Allergic reaction 10/17/2020  . Migraine without aura and without status migrainosus, not intractable 03/31/2019  . Endometriosis 08/20/2017  . Diverticular disease 02/10/2017    Past Surgical History:  Procedure Laterality Date  . BOWEL RESECTION  02/10/2017  . BOWEL RESECTION    . CHOLECYSTECTOMY    . COLONOSCOPY    . 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 KNEE SURGERY     left knee  . LAPAROSCOPIC LYSIS OF ADHESIONS N/A 08/20/2017   Procedure: EXTENSIVE LAPAROSCOPIC LYSIS OF ADHESIONS;  Surgeon: Sherian Rein, MD;  Location: [facility];  Service: Gynecology;  Laterality: N/A;  . LAPAROSCOPY N/A 08/20/2017   Procedure: LAPAROSCOPY OPERATIVE, LEFT OVARIAN CYSTECTOMY, RIGHT PARATUBAL CYSTECTOMY;  Surgeon: Sherian Rein, MD;  Location: [facility];  Service: Gynecology;  Laterality: N/A;  . UPPER GI ENDOSCOPY    . WISDOM TOOTH EXTRACTION       OB History   No obstetric history on file.     History reviewed. No pertinent family history.  Social History   Tobacco Use  . Smoking status:  Never Smoker  . Smokeless tobacco: Never Used  Vaping Use  . Vaping Use: Never used  Substance Use Topics  . Alcohol use: Yes    Comment: socially  . Drug use: No    Home Medications Prior to Admission medications   Medication Sig Start Date End Date Taking? Authorizing Provider  acetaminophen (TYLENOL) 500 MG tablet Take 1,000 mg by mouth every 6 (six) hours as needed for moderate pain.    [provider]  amoxicillin-clavulanate (AUGMENTIN) 875-125 MG tablet Take 1 tablet by mouth 2 (two) times daily. 04/25/18   Demetrio Lapping, PA-C  benzonatate (TESSALON) 100 MG capsule Take 1-2 capsules (100-200 mg total)  by mouth 3 (three) times daily as needed for cough. 10/09/20   Lurene Shadow, PA-C  butorphanol (STADOL) 10 MG/ML nasal spray PLACE 1 SPRAY IN ONE NOSTRIL AS DIRECTED EVERY 6 HOURS IF NEEDED 09/05/20 03/04/21  Mitzi Hansen, NP  chlorproMAZINE (THORAZINE) 25 MG tablet Take 1 tablet (25 mg total) by mouth as needed (take one tablet as needed for Headache). 08/14/19   Glyn Ade, Scot Jun, PA-C  clobetasol ointment (TEMOVATE) 0.05 % APPLY EXTERNALLY TO AFFECTED AREA(S) TWICE DAILY 05/02/20 05/02/21  Axner, Lowanda Foster, PA-C  clonazePAM (KLONOPIN) 0.5 MG tablet Take 0.25 mg by mouth at bedtime as needed.     [provider]  cyclobenzaprine (FLEXERIL) 10 MG tablet Take 1 tablet (10 mg total) by mouth 3 (three) times daily as needed for muscle spasms. 03/31/19   Glyn Ade, Scot Jun, PA-C  diphenhydrAMINE (BENADRYL) 50 MG tablet Take 50 mg by mouth as needed for itching.    [provider]  EPINEPHrine 0.3 mg/0.3 mL IJ SOAJ injection Inject 0.3 mg into the muscle as needed for anaphylaxis. 10/15/20   Gilda Crease, MD  escitalopram (LEXAPRO) 10 MG tablet Take 10 mg by mouth at bedtime.    [provider]  FIBER ADULT GUMMIES PO Take 2 each by mouth daily.    [provider]  fluticasone (FLONASE) 50 MCG/ACT nasal spray Place 2 sprays into both nostrils daily. 09/23/17   Waldon Merl, PA-C  fluticasone (FLONASE) 50 MCG/ACT nasal spray Place 2 sprays into both nostrils daily. 04/25/18   Demetrio Lapping, PA-C  HYDROcodone-acetaminophen (NORCO/VICODIN) 5-325 MG tablet Take 1 tablet by mouth as needed for moderate pain.    [provider]  hydrOXYzine (ATARAX/VISTARIL) 10 MG tablet TAKE 1 TABLET BY MOUTH BEFORE BEDTIME 05/02/20 05/02/21  Axner, Lowanda Foster, PA-C  Insulin Pen Needle 31G X 6 MM MISC USE AS DIRECTED WITH SAXENDA 09/06/20 09/06/21  Mitzi Hansen, NP  ipratropium (ATROVENT) 0.03 % nasal spray 2 sprays per nostril 2-3 times daily as needed for congestion  and post-nasal drainage 10/09/20   Lurene Shadow, PA-C  Lasmiditan Succinate (REYVOW) 100 MG TABS Take 100 mg by mouth as needed. 08/11/19   Teague Chestine Spore, Scot Jun, PA-C  levalbuterol [facility] HFA) 45 MCG/ACT inhaler Inhale 2 puffs into the lungs every 4 (four) hours as needed for wheezing.    [provider]  levonorgestrel (MIRENA) 20 MCG/24HR IUD 1 each by Intrauterine route once.    [provider]  Liraglutide -Weight Management 18 MG/3ML SOPN INJECT 0.6MG  UNDER THE SKIN ONCE/DAY X 7 DAYS, INCREASE BY 0.6 MG WEEKLY TO TARGET DOSE OF 3 MG DAILY 09/30/20 09/30/21  Mitzi Hansen, NP  Liraglutide -Weight Management 18 MG/3ML SOPN INJECT 0.6MG  UNDER THE SKIN ONCE A DAY FOR 7 DAYS INCREASE 0.6 MG WEEKLY  TO TARGET DOSE OF  DAILY 09/06/20 09/06/21  Mitzi Hansen, NP  metFORMIN (GLUCOPHAGE) 500 MG tablet Take 1,000 mg by mouth 2 (two) times daily. 01/26/19   [provider]  montelukast (SINGULAIR) 10 MG tablet montelukast 10 mg tablet  TAKE 1 TABLET BY MOUTH DAILY    [provider]  Multiple Vitamins-Minerals (MULTIVITAMIN WITH MINERALS) tablet Take 1 tablet by mouth daily.    [provider]  naproxen (NAPROSYN) 500 MG tablet Take 1 tablet (500 mg total) by mouth 2 (two) times daily as needed for moderate pain. 08/20/17   Bovard-Stuckert, Augusto Gamble, MD  ondansetron (ZOFRAN) 4 MG tablet Take 1 tablet (4 mg total) by mouth every 6 (six) hours as needed for nausea. 02/14/17   Ovidio Kin, MD  oseltamivir (TAMIFLU) 75 MG capsule Take 1 capsule (75 mg total) by mouth 2 (two) times daily. 05/13/18   Withrow, Everardo All, FNP  oxyCODONE (ROXICODONE) 5 MG immediate release tablet Take 1 tablet (5 mg total) by mouth every 6 (six) hours as needed for severe pain. 08/20/17   Bovard-Stuckert, Augusto Gamble, MD  pantoprazole (PROTONIX) 20 MG tablet Take 1 tablet (20 mg total) by mouth daily. Patient taking differently: Take 20 mg by mouth at bedtime.  05/04/15   Barrett Henle, PA-C   predniSONE (DELTASONE) 10 MG tablet Take 6 tablets (60 mg total) by mouth daily for 1 day, THEN 5 tablets (50 mg total) daily for 1 day, THEN 4 tablets (40 mg total) daily for 1 day, THEN 3 tablets (30 mg total) daily for 1 day, THEN 2 tablets (20 mg total) daily for 1 day, THEN 1 tablet (10 mg total) daily for 1 day. 10/15/20 10/21/20  Gilda Crease, MD  promethazine (PHENERGAN) 25 MG tablet TAKE 1 TABLET (25 MG TOTAL) BY MOUTH EVERY 6 (SIX) HOURS AS NEEDED FOR NAUSEA OR VOMITING. 11/01/19   Glyn Ade, Scot Jun, PA-C  propranolol ER (INDERAL LA) 60 MG 24 hr capsule Take 1 capsule by mouth at bedtime.  01/28/17   [provider]  Rimegepant Sulfate (NURTEC) 75 MG TBDP Take 75 mg by mouth as needed (migraine). 03/31/19   Glyn Ade, Scot Jun, PA-C  rizatriptan (MAXALT) 10 MG tablet Take 10 mg by mouth as needed for migraine. May repeat in 2 hours if needed     [provider]    Allergies    Tylenol with codeine #3 [acetaminophen-codeine] and Betadine [povidone iodine]  Review of Systems   Review of Systems  Constitutional: Negative.   HENT: Positive for sore throat, trouble swallowing and voice change.   Respiratory: Positive for cough, chest tightness and shortness of breath.   Cardiovascular: Positive for palpitations. Negative for chest pain and leg swelling.  Gastrointestinal: Negative.   Genitourinary: Negative.   Allergic/Immunologic: Positive for immunocompromised state.       Ulcerative colitis  Neurological: Negative for dizziness, tremors, syncope, weakness, light-headedness and headaches.  Hematological: Negative.   Psychiatric/Behavioral: The patient is nervous/anxious.     Physical Exam Updated Vital Signs BP 135/61 (BP Location: Right Arm)   Pulse (!) 103   Temp 99.2 F (37.3 C) (Oral)   Resp 18   Ht  (1.549 m)   Wt 104.3 kg   LMP 09/26/2020   SpO2 100%   BMI 43.46 kg/m   Physical Exam Vitals and nursing note reviewed.  HENT:      Head: Normocephalic and atraumatic.     Nose: Nose normal.  Mouth/Throat:     Mouth: Mucous membranes are moist.     Tongue: No lesions.     Pharynx: Oropharynx is clear. Uvula midline. No oropharyngeal exudate, posterior oropharyngeal erythema or uvula swelling.     Tonsils: No tonsillar exudate.     Comments: Oropharynx is open and clear, without signs of edema, no uvular deviation Eyes:     General: Lids are normal. Vision grossly intact.        Right eye: No discharge.        Left eye: No discharge.     Extraocular Movements: Extraocular movements intact.     Conjunctiva/sclera: Conjunctivae normal.     Pupils: Pupils are equal, round, and reactive to light.  Neck:     Trachea: Trachea normal.     Comments: Hoarseness of voice, anterior neck tenderness to palpation without crepitus or obvious edema  Cardiovascular:     Rate and Rhythm: Regular rhythm. Tachycardia present.     Pulses: Normal pulses.     Heart sounds: Normal heart sounds. No murmur heard.   Pulmonary:     Effort: Pulmonary effort is normal. Tachypnea and prolonged expiration present. No accessory muscle usage, respiratory distress or retractions.     Breath sounds: Decreased air movement present. Examination of the right-middle field reveals wheezing. Examination of the left-middle field reveals wheezing. Examination of the right-lower field reveals wheezing. Examination of the left-lower field reveals wheezing. Wheezing present. No rales.  Chest:     Chest wall: No mass, lacerations, deformity, swelling, tenderness, crepitus or edema.    Abdominal:     General: Bowel sounds are normal. There is no distension.     Palpations: Abdomen is soft.     Tenderness: There is no abdominal tenderness.  Musculoskeletal:        General: No deformity.     Cervical back: Normal range of motion and neck supple. No edema, rigidity or crepitus. No pain with movement, spinous process tenderness or muscular tenderness.      Right lower leg: No edema.     Left lower leg: No edema.  Lymphadenopathy:     Cervical: No cervical adenopathy.  Skin:    General: Skin is warm and dry.     Capillary Refill: Capillary refill takes less than 2 seconds.     Findings: Rash present. Rash is urticarial.     Comments: Erythematous, urticarial rash over the anterior chest and posterior upper arms bilaterally, resulting in the same after administration of epinephrine by EMS.  Neurological:     General: No focal deficit present.     Mental Status: She is alert and oriented to person, place, and time. Mental status is at baseline.     Cranial Nerves: Cranial nerves are intact.     Sensory: Sensation is intact.     Motor: Motor function is intact.     Gait: Gait is intact.  Psychiatric:        Mood and Affect: Mood normal.     ED Results / Procedures / Treatments   Labs (all labs ordered are listed, but only abnormal results are displayed) Labs Reviewed  CBC WITH DIFFERENTIAL/PLATELET - Abnormal; Notable for the following components:      Result Value   WBC 19.1 (*)    Neutro Abs 17.2 (*)    Abs Immature Granulocytes 0.29 (*)    All other components within normal limits  BASIC METABOLIC PANEL - Abnormal; Notable for the following components:   CO2  20 (*)    Glucose, Bld 143 (*)    Calcium 8.7 (*)    All other components within normal limits  RESP PANEL BY RT-PCR (FLU A&B, COVID) ARPGX2  HCG, SERUM, QUALITATIVE    EKG EKG Interpretation  Date/Time:  Thursday October 17 2020 12:34:46 EDT Ventricular Rate:  91 PR Interval:  143 QRS Duration: 91 QT Interval:  372 QTC Calculation: 458 R Axis:   63 Text Interpretation: Sinus rhythm no acute ST/T changes Confirmed by Pricilla Loveless [phone]) on 10/17/2020 1:33:13 PM   Radiology DG Chest Portable 1 View  Result Date: 10/17/2020 CLINICAL DATA:  Shortness of breath with cough EXAM: PORTABLE CHEST 1 VIEW COMPARISON:  None. FINDINGS: Lungs are clear. Heart size and  pulmonary vascularity are normal. No adenopathy. No bone lesions. IMPRESSION: Lungs clear.  Cardiac silhouette normal. Electronically Signed   By: Bretta Bang III M.D.   On: 10/17/2020 12:38    Procedures Procedures   Medications Ordered in ED Medications  ipratropium-albuterol (DUONEB) 0.5-2.5 (3) MG/3ML nebulizer solution 3 mL (3 mLs Nebulization Given 10/17/20 1206)  albuterol (PROVENTIL,VENTOLIN) solution continuous neb (10 mg/hr Nebulization Given 10/17/20 1355)    ED Course  I have reviewed the triage vital signs and the nursing notes.  Pertinent labs & imaging results that were available during my care of the patient were reviewed by me and considered in my medical decision making (see chart for details).  Clinical Course as of 10/17/20 1632  Thu Oct 17, 2020  1503 Continuous albuterol neb discontinued due to patient experiencing tachycardia and anxiety. [RS]  1609 Consult to hospitalist [facility] Rad[facility]icker [facility], who is agreeable to seeing this patient admitting her to his service.  Appreciate his collaboration care of this patient. [RS]    Clinical Course User Index [RS] Tonji Elliff, Eugene Gavia, PA-C   MDM Rules/Calculators/A&P                         35 year old female presents to emergency department with respiratory distress, chest tightness, tachypnea, and tachycardia concerning for possible biphasic allergic reaction versus acute asthma exacerbation.    Tachypneic and tachycardic on intake, vital signs otherwise unremarkable.  Cardiac exam revealed tachycardia with heart rate in the 120s, pulmonary exam revealed tachypnea with respiratory rate in the mid 20s.  There is wheezing the lung bases bilaterally and decreased breath sounds throughout.  Patient has tenderness palpation of the anterior neck without crepitus or edema, no tracheal deviation.  Oropharynx is clear without sign of airway compromise.  There is erythematous urticarial rash on the chest and upper arms, resolving per  patient.  Patient has already received Solu-Medrol, antihistamines Benadryl and Pepcid, epinephrine x3, and 2 g of magnesium.  We will proceed with DuoNeb and reevaluate.  Patient reevaluated after DuoNeb with mild improvement in her symptoms with persistent chest tightness.  Patient continues to have a wheezing in the lung bases bilaterally.  We will proceed with continuous albuterol nebulizer.  CBC with leukocytosis of 19,000, likely reactive, BMP unremarkable.  Patient is not pregnant, chest x-ray negative for acute cardiopulmonary disease, EKG sinus rhythm.  Continuous albuterol neb discontinued due to patient experiencing tachycardia; she continues to have chest tightness and endorses increased tightness in her throat and tingling in her lips at this time.  She endorses mild improvement in her chest tightness but denies any real relief.  At this time given administration of many medications to try to control this patient's symptoms  without success, feel this patient would benefit from admission to the hospital for further evaluation and management.  Consult placed to hospitalist as above, [facility].  [name] [name] voiced understanding of her medical evaluation and treatment plan.  Each of her questions was answered to her expressed satisfaction.  She is amenable to plan for admission at this time.  This chart was dictated using voice recognition software, Dragon. Despite the best efforts of this provider to proofread and correct errors, errors may still occur which can change documentation meaning.  Final Clinical Impression(s) / ED Diagnoses Final diagnoses:  None    Rx / DC Orders ED Discharge Orders    None       Sherrilee GillesSponseller, Ladarien Beeks R, PA-C 10/17/20 1632    Pricilla LovelessGoldston, Scott, MD 10/18/20 1801

## 2020-10-17 NOTE — ED Notes (Signed)
CAT stopped about 1/2 way through. Patient HR in the 120's and feeling dizzy. Nurse at bedside and I made PA aware. RT to monitor.

## 2020-10-18 DIAGNOSIS — R059 Cough, unspecified: Secondary | ICD-10-CM | POA: Diagnosis not present

## 2020-10-18 DIAGNOSIS — K219 Gastro-esophageal reflux disease without esophagitis: Secondary | ICD-10-CM | POA: Diagnosis not present

## 2020-10-18 DIAGNOSIS — Z888 Allergy status to other drugs, medicaments and biological substances status: Secondary | ICD-10-CM | POA: Diagnosis not present

## 2020-10-18 DIAGNOSIS — Z885 Allergy status to narcotic agent status: Secondary | ICD-10-CM | POA: Diagnosis not present

## 2020-10-18 DIAGNOSIS — T782XXA Anaphylactic shock, unspecified, initial encounter: Secondary | ICD-10-CM | POA: Diagnosis not present

## 2020-10-18 DIAGNOSIS — R9431 Abnormal electrocardiogram [ECG] [EKG]: Secondary | ICD-10-CM | POA: Diagnosis not present

## 2020-10-18 DIAGNOSIS — Z6841 Body Mass Index (BMI) 40.0 and over, adult: Secondary | ICD-10-CM | POA: Diagnosis not present

## 2020-10-18 DIAGNOSIS — T7840XA Allergy, unspecified, initial encounter: Secondary | ICD-10-CM | POA: Diagnosis not present

## 2020-10-18 DIAGNOSIS — F32A Depression, unspecified: Secondary | ICD-10-CM | POA: Diagnosis not present

## 2020-10-18 DIAGNOSIS — F419 Anxiety disorder, unspecified: Secondary | ICD-10-CM | POA: Diagnosis not present

## 2020-10-18 DIAGNOSIS — Z79899 Other long term (current) drug therapy: Secondary | ICD-10-CM | POA: Diagnosis not present

## 2020-10-18 DIAGNOSIS — Z20822 Contact with and (suspected) exposure to covid-19: Secondary | ICD-10-CM | POA: Diagnosis not present

## 2020-10-18 DIAGNOSIS — Z9102 Food additives allergy status: Secondary | ICD-10-CM | POA: Diagnosis not present

## 2020-10-18 DIAGNOSIS — G43009 Migraine without aura, not intractable, without status migrainosus: Secondary | ICD-10-CM | POA: Diagnosis not present

## 2020-10-18 DIAGNOSIS — J45901 Unspecified asthma with (acute) exacerbation: Secondary | ICD-10-CM | POA: Diagnosis not present

## 2020-10-18 DIAGNOSIS — R0602 Shortness of breath: Secondary | ICD-10-CM | POA: Diagnosis not present

## 2020-10-18 DIAGNOSIS — I10 Essential (primary) hypertension: Secondary | ICD-10-CM | POA: Diagnosis present

## 2020-10-18 LAB — BASIC METABOLIC PANEL
Anion gap: 9 (ref 5–15)
BUN: 22 mg/dL — ABNORMAL HIGH (ref 6–20)
CO2: 21 mmol/L — ABNORMAL LOW (ref 22–32)
Calcium: 8.8 mg/dL — ABNORMAL LOW (ref 8.9–10.3)
Chloride: 108 mmol/L (ref 98–111)
Creatinine, Ser: 0.87 mg/dL (ref 0.44–1.00)
GFR, Estimated: >60 mL/min (ref >60)
Glucose, Bld: 157 mg/dL — ABNORMAL HIGH (ref 70–99)
Potassium: 4 mmol/L (ref 3.5–5.1)
Sodium: 138 mmol/L (ref 135–145)

## 2020-10-18 LAB — CBC
HCT: 40.4 % (ref 36.0–46.0)
Hemoglobin: 13.1 g/dL (ref 12.0–15.0)
MCH: 29.2 pg (ref 26.0–34.0)
MCHC: 32.4 g/dL (ref 30.0–36.0)
MCV: 90 fL (ref 80.0–100.0)
Platelets: 333 10*3/uL (ref 150–400)
RBC: 4.49 MIL/uL (ref 3.87–5.11)
RDW: 13.2 % (ref 11.5–15.5)
WBC: 17.1 10*3/uL — ABNORMAL HIGH (ref 4.0–10.5)
nRBC: 0 % (ref 0.0–0.2)

## 2020-10-18 MED ORDER — DIPHENHYDRAMINE HCL 50 MG/ML IJ SOLN
12.5000 mg | Freq: Four times a day (QID) | INTRAMUSCULAR | Status: DC | PRN
  Administered 2020-10-18: 12.5 mg via INTRAVENOUS
  Filled 2020-10-18: qty 1

## 2020-10-18 NOTE — Progress Notes (Signed)
PROGRESS NOTE    Ellinore Merced  CHE:[id number] DOB: [birth year] DOA: 10/17/2020 PCP: Joycelyn Rua, MD    Brief Narrative:  35 y.o. female with medical history significant for Migraine, asthma, HTN, GERD, IBS, ulcerative colitis, Anxiety/depression who presents from [facility] ED to [facility] for anaphylactic allergic reaction.   She was evaluated in the ED on 10/15/20 for rash, itching, wheezing and shortness of breath after eating a sub that potentially had mushrooms which is a known allergen. She was given IM Epi, Decadron, Pepcid and duo-neb in ED with improvement and discharged home with steroid taper and Epi-pen. Also taking daily Zyrtec.  Pt presented with allergic reaction again with chest tightness, hoarseness of voice, shortness of breath and rash  Assessment & Plan:   Principal Problem:   Anaphylactic reaction Active Problems:   Migraine without aura and without status migrainosus, not intractable   Allergic reaction   HTN (hypertension)   GERD (gastroesophageal reflux disease)   Anxiety   Depression   Obesity, Class III, BMI 40-49.9 (morbid obesity) (HCC)  Biphasic anaphylaxis reaction  -Likely also has a component of asthma exacerbation- pt states asthma is well controlled and usually only triggered by seasonal changes -Pt is continued with BID 60mg  Solu-Medrol, Benadryl as needed, scheduled q4hr ipratropium -Cont with Epi 0.3mg  PRN  Asthma exacerbation -Decreased BS in all fields on exam -When seen this AM, patient continued to require 2LNC, visibly appeared sob -Pt reports improvement with neb tx -Would continue scheduled IV steroids and neb tx per above  HTN Continue labetalol as tolerated -BP stable at present  GERD -Continue PPI as tolerated  Anxiety/Depression -Continue with Lexapro, Clonazepam as needed  Hx of Migraine  -Pt has multiple PRN -Pt is continued on PRN Thorazine for now   Morbid obesity BMI greater than 40 Patient had been on Saxenda -will  hold for now  DVT prophylaxis: Lovenox subq Code Status: Full Family Communication: Pt in room, family not at bedside  Status is: Observation  The patient will require care spanning > 2 midnights and should be moved to inpatient because: IV treatments appropriate due to intensity of illness or inability to take PO and Inpatient level of care appropriate due to severity of illness  Dispo: The patient is from: Home              Anticipated d/c is to: Home              Patient currently is not medically stable to d/c.   Difficult to place patient No       Consultants:     Procedures:     Antimicrobials: Anti-infectives (From admission, onward)   None       Subjective: Still feeling sob  Objective: Vitals:   10/18/20 0618 10/18/20 0813 10/18/20 1217 10/18/20 1343  BP: (!) 145/89   (!) 145/100  Pulse: 88   71  Resp: 15   20  Temp: 97.7 F (36.5 C)   98 F (36.7 C)  TempSrc: Oral   Oral  SpO2: 100% 98% 97% 100%  Weight:      Height:       No intake or output data in the 24 hours ending 10/18/20 1347 Filed Weights   10/17/20 1136  Weight: 104.3 kg    Examination: General exam: Awake, laying in bed, in nad Respiratory system: Increased resp effort, decreased BS throughout Cardiovascular system: regular rate, s1, s2 Gastrointestinal system: Soft, nondistended, positive BS Central nervous system: CN2-12 grossly intact,  strength intact Extremities: Perfused, no clubbing Skin: Normal skin turgor, no notable skin lesions seen Psychiatry: Mood normal // no visual hallucinations   Data Reviewed: I have personally reviewed following labs and imaging studies  CBC: Recent Labs  Lab 10/17/20 1235 10/18/20 0611  WBC 19.1* 17.1*  NEUTROABS 17.2*  --   HGB 13.5 13.1  HCT 39.4 40.4  MCV 84.9 90.0  PLT 357 333   Basic Metabolic Panel: Recent Labs  Lab 10/17/20 1235 10/18/20 0611  NA 137 138  K 3.9 4.0  CL 108 108  CO2 20* 21*  GLUCOSE 143* 157*  BUN  17 22*  CREATININE 0.82 0.87  CALCIUM 8.7* 8.8*   GFR: Estimated Creatinine Clearance: 101.3 mL/min (by C-G formula based on SCr of 0.87 mg/dL). Liver Function Tests: No results for input(s): AST, ALT, ALKPHOS, BILITOT, PROT, ALBUMIN in the last 168 hours. No results for input(s): LIPASE, AMYLASE in the last 168 hours. No results for input(s): AMMONIA in the last 168 hours. Coagulation Profile: No results for input(s): INR, PROTIME in the last 168 hours. Cardiac Enzymes: No results for input(s): CKTOTAL, CKMB, CKMBINDEX, TROPONINI in the last 168 hours. BNP (last 3 results) No results for input(s): PROBNP in the last 8760 hours. HbA1C: No results for input(s): HGBA1C in the last 72 hours. CBG: No results for input(s): GLUCAP in the last 168 hours. Lipid Profile: No results for input(s): CHOL, HDL, LDLCALC, TRIG, CHOLHDL, LDLDIRECT in the last 72 hours. Thyroid Function Tests: No results for input(s): TSH, T4TOTAL, FREET4, T3FREE, THYROIDAB in the last 72 hours. Anemia Panel: No results for input(s): VITAMINB12, FOLATE, FERRITIN, TIBC, IRON, RETICCTPCT in the last 72 hours. Sepsis Labs: No results for input(s): PROCALCITON, LATICACIDVEN in the last 168 hours.  Recent Results (from the past 240 hour(s))  Resp Panel by RT-PCR (Flu A&B, Covid) Nasopharyngeal Swab     Status: None   Collection Time: 10/17/20  4:02 PM   Specimen: Nasopharyngeal Swab; Nasopharyngeal(NP) swabs in vial transport medium  Result Value Ref Range Status   SARS Coronavirus 2 by RT PCR NEGATIVE NEGATIVE Final    Comment: (NOTE) SARS-CoV-2 target nucleic acids are NOT DETECTED.  The SARS-CoV-2 RNA is generally detectable in upper respiratory specimens during the acute phase of infection. The lowest concentration of SARS-CoV-2 viral copies this assay can detect is 138 copies/mL. A negative result does not preclude SARS-Cov-2 infection and should not be used as the sole basis for treatment or other patient  management decisions. A negative result may occur with  improper specimen collection/handling, submission of specimen other than nasopharyngeal swab, presence of viral mutation(s) within the areas targeted by this assay, and inadequate number of viral copies(<138 copies/mL). A negative result must be combined with clinical observations, patient history, and epidemiological information. The expected result is Negative.  Fact Sheet for Patients:  BloggerCourse.com  Fact Sheet for Healthcare Providers:  SeriousBroker.it  This test is no t yet approved or cleared by the [place] FDA and  has been authorized for detection and/or diagnosis of SARS-CoV-2 by FDA under an Emergency Use Authorization (EUA). This EUA will remain  in effect (meaning this test can be used) for the duration of the COVID-19 declaration under Section 564(b)(1) of the Act, 21 U.S.C.section 360bbb-3(b)(1), unless the authorization is terminated  or revoked sooner.       Influenza A by PCR NEGATIVE NEGATIVE Final   Influenza B by PCR NEGATIVE NEGATIVE Final    Comment: (NOTE) The Xpert Xpress  SARS-CoV-2/FLU/RSV plus assay is intended as an aid in the diagnosis of influenza from Nasopharyngeal swab specimens and should not be used as a sole basis for treatment. Nasal washings and aspirates are unacceptable for Xpert Xpress SARS-CoV-2/FLU/RSV testing.  Fact Sheet for Patients: BloggerCourse.com  Fact Sheet for Healthcare Providers: SeriousBroker.it  This test is not yet approved or cleared by the [place] FDA and has been authorized for detection and/or diagnosis of SARS-CoV-2 by FDA under an Emergency Use Authorization (EUA). This EUA will remain in effect (meaning this test can be used) for the duration of the COVID-19 declaration under Section 564(b)(1) of the Act, 21 U.S.C. section 360bbb-3(b)(1),  unless the authorization is terminated or revoked.  Performed at [facility], [address], [place], [place] [address]      Radiology Studies: DG Chest Portable 1 View  Result Date: 10/17/2020 CLINICAL DATA:  Shortness of breath with cough EXAM: PORTABLE CHEST 1 VIEW COMPARISON:  None. FINDINGS: Lungs are clear. Heart size and pulmonary vascularity are normal. No adenopathy. No bone lesions. IMPRESSION: Lungs clear.  Cardiac silhouette normal. Electronically Signed   By: Bretta Bang III M.D.   On: 10/17/2020 12:38    Scheduled Meds: . enoxaparin (LOVENOX) injection  40 mg Subcutaneous Q24H  . escitalopram  20 mg Oral Daily  . ipratropium  0.5 mg Nebulization Q4H  . labetalol  200 mg Oral BID  . levalbuterol  1.25 mg Nebulization Q4H  . methylPREDNISolone (SOLU-MEDROL) injection  60 mg Intravenous Q12H  . pantoprazole  20 mg Oral QHS   Continuous Infusions:   LOS: 0 days   Rickey Barbara, MD [facility] Pager On Amion  If 7PM-7AM, please contact night-coverage 10/18/2020, 1:47 PM

## 2020-10-18 NOTE — Progress Notes (Signed)
Patient's husband called to check on patient. Informed husband of patient's progress. Asked when patient would be going home. Informed him that we will know more information about that during the day.

## 2020-10-19 MED ORDER — FAMOTIDINE 20 MG PO TABS: 20.0000 mg | ORAL_TABLET | Freq: Two times a day (BID) | ORAL | 0 refills | Status: DC

## 2020-10-19 MED ORDER — LEVALBUTEROL HCL 1.25 MG/0.5ML IN NEBU
1.2500 mg | INHALATION_SOLUTION | Freq: Three times a day (TID) | RESPIRATORY_TRACT | Status: DC
  Filled 2020-10-19 (×2): qty 0.5

## 2020-10-19 MED ORDER — PREDNISONE 10 MG PO TABS: ORAL_TABLET | ORAL | 0 refills | Status: AC

## 2020-10-19 MED ORDER — HYDROCOD POLST-CPM POLST ER 10-8 MG/5ML PO SUER
5.0000 mL | Freq: Two times a day (BID) | ORAL | Status: DC | PRN
  Administered 2020-10-19: 5 mL via ORAL
  Filled 2020-10-19: qty 5

## 2020-10-19 MED ORDER — IPRATROPIUM BROMIDE 0.02 % IN SOLN
0.5000 mg | Freq: Three times a day (TID) | RESPIRATORY_TRACT | Status: DC
  Filled 2020-10-19: qty 2.5

## 2020-10-19 MED ORDER — LEVALBUTEROL HCL 0.63 MG/3ML IN NEBU: 0.6300 mg | INHALATION_SOLUTION | Freq: Four times a day (QID) | RESPIRATORY_TRACT | Status: DC | PRN

## 2020-10-19 MED ORDER — HYDROCOD POLST-CPM POLST ER 10-8 MG/5ML PO SUER: 5.0000 mL | Freq: Two times a day (BID) | ORAL | 0 refills | Status: DC | PRN

## 2020-10-19 NOTE — Discharge Summary (Signed)
Physician Discharge Summary  [name] [name] WYO:[id number] DOB: 1985/12/16 DOA: 10/17/2020  PCP: Joycelyn Rua, MD  Admit date: 10/17/2020 Discharge date: 10/19/2020  Admitted From: Home Disposition:  Home  Recommendations for Outpatient Follow-up:  1. Follow up with PCP in 1-2 weeks 2. Follow up with Allergist as scheduled   Discharge Condition:Improved CODE STATUS:Full Diet recommendation: Regular   Brief/Interim Summary: 35 y.o.femalewith medical history significant forMigraine, asthma, HTN, GERD, IBS, ulcerative colitis, Anxiety/depressionwho [facility] foranaphylacticallergic reaction.  She was evaluated in the ED on 10/15/20 for rash, itching, wheezing and shortness of breath after eating a sub that potentially had mushrooms which pt had previously assumed was a known allergen. She was given IM Epi, Decadron, Pepcid and duo-neb in ED with improvement and discharged home with steroid taperand Epi-pen.Also taking daily Zyrtec.  Pt presented with allergic reactionagain with chest tightness,hoarseness of voice, shortness of breath and rash  Discharge Diagnoses:  Principal Problem:   Anaphylactic reaction Active Problems:   Migraine without aura and without status migrainosus, not intractable   Allergic reaction   HTN (hypertension)   GERD (gastroesophageal reflux disease)   Anxiety   Depression   Obesity, Class III, BMI 40-49.9 (morbid obesity) (HCC)   Biphasic anaphylaxis reaction  -Likely also has a component of asthma exacerbation- pt states asthma is well controlled andusually only triggered by seasonal changes -Pt was continued with BID 60mg Solu-Medrol, Benadryl as needed, scheduled q4hripratropium -wheezing resolved and pt ambulated in hall without requiring O2 -Pt to complete prednisone taper on d/c, as well as famotidine, PRN benadryl. Pt to continue with antihistamine per home regimen -Have recommended close follow up with Allergist    Asthma exacerbation -Decreased BS in all fields on exam initially -Improved with scheduled IV steroids and scheduled neb treatments  HTN Continued labetalol as tolerated -BP remained stable  GERD -Continue PPI as tolerated  Anxiety/Depression -Continue with Lexapro, Clonazepam as needed  Hx of Migraine -Pt hasmultiplePRN -Pt is continued on PRN Thorazine while in hospital  Morbid obesity BMI greater than 40 Patient had been on Saxenda-recommend continuing to hold at time of d/c until more stable   Discharge Instructions   Allergies as of 10/19/2020      Reactions   Mushroom Extract Complex Anaphylaxis   Tylenol With Codeine #3 [acetaminophen-codeine] Palpitations   Oxycodone    Other reaction(s): Flushing   Betadine [povidone Iodine] Rash      Medication List    STOP taking these medications   amoxicillin-clavulanate 875-125 MG tablet Commonly known as: AUGMENTIN   naproxen 500 MG tablet Commonly known as: NAPROSYN   oxyCODONE 5 MG immediate release tablet Commonly known as: Roxicodone   propranolol ER 60 MG 24 hr capsule Commonly known as: INDERAL LA   Saxenda 18 MG/3ML Sopn Generic drug: Liraglutide -Weight Management     TAKE these medications   acetaminophen 500 MG tablet Commonly known as: TYLENOL Take 1,000 mg by mouth every 6 (six) hours as needed for headache.   benzonatate 100 MG capsule Commonly known as: TESSALON Take 1-2 capsules (100-200 mg total) by mouth 3 (three) times daily as needed for cough. What changed: how much to take   butorphanol 10 MG/ML nasal spray Commonly known as: STADOL PLACE 1 SPRAY IN ONE NOSTRIL AS DIRECTED EVERY 6 HOURS IF NEEDED What changed:   how much to take  how to take this  when to take this  reasons to take this   cetirizine 10 MG tablet Commonly  known as: ZYRTEC Take 10 mg by mouth daily.   chlorproMAZINE 25 MG tablet Commonly known as: THORAZINE Take 1 tablet (25 mg total) by  mouth as needed (take one tablet as needed for Headache). What changed: when to take this   clobetasol ointment 0.05 % Commonly known as: TEMOVATE APPLY EXTERNALLY TO AFFECTED AREA(S) TWICE DAILY What changed:   how much to take  how to take this  when to take this  reasons to take this   clonazePAM 0.5 MG tablet Commonly known as: KLONOPIN Take 0.25 mg by mouth at bedtime as needed for anxiety.   cyclobenzaprine 10 MG tablet Commonly known as: FLEXERIL Take 1 tablet (10 mg total) by mouth 3 (three) times daily as needed for muscle spasms.   diphenhydrAMINE 50 MG tablet Commonly known as: BENADRYL Take 50 mg by mouth daily as needed for itching.   EPINEPHrine 0.3 mg/0.3 mL Soaj injection Commonly known as: EPI-PEN Inject 0.3 mg into the muscle as needed for anaphylaxis. What changed: when to take this   escitalopram 20 MG tablet Commonly known as: LEXAPRO Take 20 mg by mouth daily.   famotidine 20 MG tablet Commonly known as: PEPCID Take 1 tablet (20 mg total) by mouth 2 (two) times daily for 7 days.   fluticasone 50 MCG/ACT nasal spray Commonly known as: FLONASE Place 2 sprays into both nostrils daily. What changed: Another medication with the same name was removed. Continue taking this medication, and follow the directions you see here.   hydrOXYzine 10 MG tablet Commonly known as: ATARAX/VISTARIL TAKE 1 TABLET BY MOUTH BEFORE BEDTIME What changed:   how much to take  how to take this  when to take this  reasons to take this   ibuprofen 200 MG tablet Commonly known as: ADVIL Take 800 mg by mouth every 6 (six) hours as needed for headache.   ipratropium 0.03 % nasal spray Commonly known as: ATROVENT 2 sprays per nostril 2-3 times daily as needed for congestion and post-nasal drainage   labetalol 200 MG tablet Commonly known as: NORMODYNE Take 200 mg by mouth 2 (two) times daily.   levalbuterol 45 MCG/ACT inhaler Commonly known as: XOPENEX  HFA Inhale 2 puffs into the lungs every 4 (four) hours as needed for wheezing.   metFORMIN 500 MG tablet Commonly known as: GLUCOPHAGE Take 1,000 mg by mouth 2 (two) times daily.   Nurtec 75 MG Tbdp Generic drug: Rimegepant Sulfate Take 75 mg by mouth as needed (migraine). What changed: when to take this   ondansetron 4 MG tablet Commonly known as: ZOFRAN Take 1 tablet (4 mg total) by mouth every 6 (six) hours as needed for nausea.   pantoprazole 20 MG tablet Commonly known as: PROTONIX Take 1 tablet (20 mg total) by mouth daily. What changed: when to take this   predniSONE 10 MG tablet Commonly known as: DELTASONE Take 6 tablets (60 mg total) by mouth daily for 2 days, THEN 4 tablets (40 mg total) daily for 2 days, THEN 2 tablets (20 mg total) daily for 2 days, THEN 1 tablet (10 mg total) daily for 2 days, THEN 0.5 tablets (5 mg total) daily for 2 days. Start taking on: October 19, 2020 What changed: See the new instructions.   promethazine 25 MG tablet Commonly known as: PHENERGAN TAKE 1 TABLET (25 MG TOTAL) BY MOUTH EVERY 6 (SIX) HOURS AS NEEDED FOR NAUSEA OR VOMITING.   Reyvow 100 MG Tabs Generic drug: Lasmiditan Succinate Take 100  mg by mouth as needed.   Tosymra 10 MG/ACT Soln Generic drug: SUMAtriptan Inject 10 mg into the skin daily as needed (migraine).   traMADol 50 MG tablet Commonly known as: ULTRAM Take 50 mg by mouth 2 (two) times daily as needed for moderate pain.   Unifine Pentips 31G X 6 MM Misc Generic drug: Insulin Pen Needle USE AS DIRECTED WITH SAXENDA       Follow-up Information    Joycelyn Rua, MD. Schedule an appointment as soon as possible for a visit in 1 week(s).   Specialty: Family Medicine Contact information: [address] [place] [address] [phone]        Asthma, [facility] Schedule an appointment as soon as possible for a visit.   Specialty: Allergy and Immunology Contact information: [address] [place] [address] [phone]              Allergies  Allergen Reactions  . Mushroom Extract Complex Anaphylaxis  . Tylenol With Codeine #3 [Acetaminophen-Codeine] Palpitations  . Oxycodone     Other reaction(s): Flushing  . Betadine [Povidone Iodine] Rash    Procedures/Studies: DG Chest Portable 1 View  Result Date: 10/17/2020 CLINICAL DATA:  Shortness of breath with cough EXAM: PORTABLE CHEST 1 VIEW COMPARISON:  None. FINDINGS: Lungs are clear. Heart size and pulmonary vascularity are normal. No adenopathy. No bone lesions. IMPRESSION: Lungs clear.  Cardiac silhouette normal. Electronically Signed   By: Bretta Bang III M.D.   On: 10/17/2020 12:38     Subjective: Eager to go home today  Discharge Exam: Vitals:   10/19/20 0615 10/19/20 0905  BP: (!) 148/89   Pulse: 62   Resp: 18   Temp: 98.6 F (37 C)   SpO2: 100% 96%   Vitals:   10/18/20 2319 10/19/20 0316 10/19/20 0615 10/19/20 0905  BP:   (!) 148/89   Pulse:   62   Resp:   18   Temp:   98.6 F (37 C)   TempSrc:   Oral   SpO2: 98% 96% 100% 96%  Weight:      Height:        General: Pt is alert, awake, not in acute distress Cardiovascular: RRR, S1/S2 +, no rubs, no gallops Respiratory: CTA bilaterally, no wheezing, no rhonchi Abdominal: Soft, NT, ND, bowel sounds + Extremities: no edema, no cyanosis   The results of significant diagnostics from this hospitalization (including imaging, microbiology, ancillary and laboratory) are listed below for reference.     Microbiology: Recent Results (from the past 240 hour(s))  Resp Panel by RT-PCR (Flu A&B, Covid) Nasopharyngeal Swab     Status: None   Collection Time: 10/17/20  4:02 PM   Specimen: Nasopharyngeal Swab; Nasopharyngeal(NP) swabs in vial transport medium  Result Value Ref Range Status   SARS Coronavirus 2 by RT PCR NEGATIVE NEGATIVE Final    Comment: (NOTE) SARS-CoV-2 target nucleic acids are NOT DETECTED.  The  SARS-CoV-2 RNA is generally detectable in upper respiratory specimens during the acute phase of infection. The lowest concentration of SARS-CoV-2 viral copies this assay can detect is 138 copies/mL. A negative result does not preclude SARS-Cov-2 infection and should not be used as the sole basis for treatment or other patient management decisions. A negative result may occur with  improper specimen collection/handling, submission of specimen other than nasopharyngeal swab, presence of viral mutation(s) within the areas targeted by this assay, and inadequate number of viral copies(<138 copies/mL).  A negative result must be combined with clinical observations, patient history, and epidemiological information. The expected result is Negative.  Fact Sheet for Patients:  BloggerCourse.com  Fact Sheet for Healthcare Providers:  SeriousBroker.it  This test is no t yet approved or cleared by the [place] FDA and  has been authorized for detection and/or diagnosis of SARS-CoV-2 by FDA under an Emergency Use Authorization (EUA). This EUA will remain  in effect (meaning this test can be used) for the duration of the COVID-19 declaration under Section 564(b)(1) of the Act, 21 U.S.C.section 360bbb-3(b)(1), unless the authorization is terminated  or revoked sooner.       Influenza A by PCR NEGATIVE NEGATIVE Final   Influenza B by PCR NEGATIVE NEGATIVE Final    Comment: (NOTE) The Xpert Xpress SARS-CoV-2/FLU/RSV plus assay is intended as an aid in the diagnosis of influenza from Nasopharyngeal swab specimens and should not be used as a sole basis for treatment. Nasal washings and aspirates are unacceptable for Xpert Xpress SARS-CoV-2/FLU/RSV testing.  Fact Sheet for Patients: BloggerCourse.com  Fact Sheet for Healthcare Providers: SeriousBroker.it  This test is not yet approved or  cleared by the [place] FDA and has been authorized for detection and/or diagnosis of SARS-CoV-2 by FDA under an Emergency Use Authorization (EUA). This EUA will remain in effect (meaning this test can be used) for the duration of the COVID-19 declaration under Section 564(b)(1) of the Act, 21 U.S.C. section 360bbb-3(b)(1), unless the authorization is terminated or revoked.  Performed at [facility], [address] [place], [place] [address]      Labs: BNP (last 3 results) No results for input(s): BNP in the last 8760 hours. Basic Metabolic Panel: Recent Labs  Lab 10/17/20 1235 10/18/20 0611  NA 137 138  K 3.9 4.0  CL 108 108  CO2 20* 21*  GLUCOSE 143* 157*  BUN 17 22*  CREATININE 0.82 0.87  CALCIUM 8.7* 8.8*   Liver Function Tests: No results for input(s): AST, ALT, ALKPHOS, BILITOT, PROT, ALBUMIN in the last 168 hours. No results for input(s): LIPASE, AMYLASE in the last 168 hours. No results for input(s): AMMONIA in the last 168 hours. CBC: Recent Labs  Lab 10/17/20 1235 10/18/20 0611  WBC 19.1* 17.1*  NEUTROABS 17.2*  --   HGB 13.5 13.1  HCT 39.4 40.4  MCV 84.9 90.0  PLT 357 333   Cardiac Enzymes: No results for input(s): CKTOTAL, CKMB, CKMBINDEX, TROPONINI in the last 168 hours. BNP: Invalid input(s): POCBNP CBG: No results for input(s): GLUCAP in the last 168 hours. D-Dimer No results for input(s): DDIMER in the last 72 hours. Hgb A1c No results for input(s): HGBA1C in the last 72 hours. Lipid Profile No results for input(s): CHOL, HDL, LDLCALC, TRIG, CHOLHDL, LDLDIRECT in the last 72 hours. Thyroid function studies No results for input(s): TSH, T4TOTAL, T3FREE, THYROIDAB in the last 72 hours.  Invalid input(s): FREET3 Anemia work up No results for input(s): VITAMINB12, FOLATE, FERRITIN, TIBC, IRON, RETICCTPCT in the last 72 hours. Urinalysis    Component Value Date/Time   COLORURINE YELLOW 05/18/2016 1130   APPEARANCEUR  CLEAR 05/18/2016 1130   LABSPEC 1.008 05/18/2016 1130   PHURINE 6.5 05/18/2016 1130   GLUCOSEU NEGATIVE 05/18/2016 1130   HGBUR NEGATIVE 05/18/2016 1130   BILIRUBINUR NEGATIVE 05/18/2016 1130   KETONESUR NEGATIVE 05/18/2016 1130   PROTEINUR NEGATIVE 05/18/2016 1130   UROBILINOGEN 0.2 05/04/2015 0830   NITRITE NEGATIVE 05/18/2016 1130   LEUKOCYTESUR TRACE (A)  05/18/2016 1130   Sepsis Labs Invalid input(s): PROCALCITONIN,  WBC,  LACTICIDVEN Microbiology Recent Results (from the past 240 hour(s))  Resp Panel by RT-PCR (Flu A&B, Covid) Nasopharyngeal Swab     Status: None   Collection Time: 10/17/20  4:02 PM   Specimen: Nasopharyngeal Swab; Nasopharyngeal(NP) swabs in vial transport medium  Result Value Ref Range Status   SARS Coronavirus 2 by RT PCR NEGATIVE NEGATIVE Final    Comment: (NOTE) SARS-CoV-2 target nucleic acids are NOT DETECTED.  The SARS-CoV-2 RNA is generally detectable in upper respiratory specimens during the acute phase of infection. The lowest concentration of SARS-CoV-2 viral copies this assay can detect is 138 copies/mL. A negative result does not preclude SARS-Cov-2 infection and should not be used as the sole basis for treatment or other patient management decisions. A negative result may occur with  improper specimen collection/handling, submission of specimen other than nasopharyngeal swab, presence of viral mutation(s) within the areas targeted by this assay, and inadequate number of viral copies(<138 copies/mL). A negative result must be combined with clinical observations, patient history, and epidemiological information. The expected result is Negative.  Fact Sheet for Patients:  BloggerCourse.comhttps://www.fda.gov/media/152166/download  Fact Sheet for Healthcare Providers:  SeriousBroker.ithttps://www.fda.gov/media/152162/download  This test is no t yet approved or cleared by the [place] FDA and  has been authorized for detection and/or diagnosis of SARS-CoV-2 by FDA  under an Emergency Use Authorization (EUA). This EUA will remain  in effect (meaning this test can be used) for the duration of the COVID-19 declaration under Section 564(b)(1) of the Act, 21 U.S.C.section 360bbb-3(b)(1), unless the authorization is terminated  or revoked sooner.       Influenza A by PCR NEGATIVE NEGATIVE Final   Influenza B by PCR NEGATIVE NEGATIVE Final    Comment: (NOTE) The Xpert Xpress SARS-CoV-2/FLU/RSV plus assay is intended as an aid in the diagnosis of influenza from Nasopharyngeal swab specimens and should not be used as a sole basis for treatment. Nasal washings and aspirates are unacceptable for Xpert Xpress SARS-CoV-2/FLU/RSV testing.  Fact Sheet for Patients: BloggerCourse.comhttps://www.fda.gov/media/152166/download  Fact Sheet for Healthcare Providers: SeriousBroker.ithttps://www.fda.gov/media/152162/download  This test is not yet approved or cleared by the [place] FDA and has been authorized for detection and/or diagnosis of SARS-CoV-2 by FDA under an Emergency Use Authorization (EUA). This EUA will remain in effect (meaning this test can be used) for the duration of the COVID-19 declaration under Section 564(b)(1) of the Act, 21 U.S.C. section 360bbb-3(b)(1), unless the authorization is terminated or revoked.  Performed at [facility], [address] [place], [place] [id number]    Time spent: 30 min  SIGNED:   Rickey BarbaraStephen Meila Berke, MD  [facility] 10/19/2020, 2:28 PM  If 7PM-7AM, please contact night-coverage

## 2020-10-21 ENCOUNTER — Encounter: Payer: Self-pay | Admitting: *Deleted

## 2020-10-21 ENCOUNTER — Other Ambulatory Visit: Payer: Self-pay | Admitting: *Deleted

## 2020-10-21 ENCOUNTER — Other Ambulatory Visit (HOSPITAL_COMMUNITY): Payer: Self-pay

## 2020-10-21 NOTE — Patient Outreach (Signed)
[facility]) Care Management  10/21/2020  [name] [name] 10/07/1985 [id number]   Transition of care call/case closure   Referral received: 10/21/09 Initial outreach:10/19/20 Insurance: [place] [employer]    Subjective: Initial successful telephone call to patient's preferred number in order to complete transition of care assessment; 2 HIPAA identifiers verified. Explained purpose of call and completed transition of care assessment.  [name] [name] states that she is still feeling crummy, tired, but better than she was. She reports still having a cough, hoarse voice, some shortness of breath, using nebulizer treatment as needed with improvement. She reports oxygen saturation being in the 90% range, denies fever. She discussed recent allergic reaction problem related to eating a sub that may have had contact with mushrooms that she is allergic to.   She reports tolerating diet, simple meals, limiting inflammatory foods staying hydrated, she discussed plan to be more careful on foods and ordering when eating out. Discussed follow up appointment with allergist in May.  She denies bowel or bladder problems.  Spouse, and patient mother are assisting with her  recovery. Patient verifies having EpiPen and understands how to use if needed.   Reviewed accessing the following [place] [employer] : She discussed ongoing health issues of Asthma that has been controlled and says she does not need a referral to one of the [place] chronic disease management programs at this time but is familiar with program.  She does  have the hospital indemnity plan, provided contact number to [employer] to file a claim if needed  She  uses a [facility] outpatient pharmacy at  [facility] outpatient pharmacy.       Objective:  [name] [name]  was hospitalized at [facility] 4/7-10/19/20 for Anaphylactic reaction , recent ED visit 4/5 at [facility]  for Allergic reaction.  Comorbidities include: HTN, GERD,  Anxiety Depression, Migraines.  She  was discharged to home on 10/19/20 without the need for home health services or DME.   Assessment:  Patient voices good understanding of all discharge instructions.  See transition of care flowsheet for assessment details. She reports having appointment with Allergist 11/29/20.    Plan:  Reviewed hospital discharge diagnosis of Allergic reaction    and discharge treatment plan using hospital discharge instructions, assessing medication adherence, reviewing problems requiring provider notification, and discussing the importance of follow up with surgeon, primary care provider and/or specialists as directed. Reinforced seeking medical attention sooner for worsening symptoms.   Reviewed [place] healthy lifestyle program information to receive discounted premium for  2023   Step 1: Get  your annual physical  Step 2: Complete your health assessment  Step 3:Identify your current health status and complete the corresponding action step between July 13, 2020 and March 13, 2021.     No ongoing care management needs identified so will close case to [facility] Care Management services and route successful outreach letter with [facility] Care Management pamphlet and 24 Hour Nurse Line Magnet to [employer] Care Management clinical pool to be mailed to patient's home address.  Thanked patient for their services to [facility].  Egbert Garibaldi, RN, BSN  [facility] Care Management,Care Management Coordinator  [phone]- Mobile [phone]- Toll Free Main Office

## 2020-10-22 ENCOUNTER — Other Ambulatory Visit (HOSPITAL_COMMUNITY): Payer: Self-pay

## 2020-10-22 MED ORDER — HYDROCOD POLST-CPM POLST ER 10-8 MG/5ML PO SUER
ORAL | 0 refills | Status: DC
  Filled 2020-10-22: qty 140, 14d supply, fill #0

## 2020-10-23 ENCOUNTER — Other Ambulatory Visit (HOSPITAL_COMMUNITY): Payer: Self-pay

## 2020-10-23 DIAGNOSIS — J45901 Unspecified asthma with (acute) exacerbation: Secondary | ICD-10-CM | POA: Diagnosis not present

## 2020-10-23 MED ORDER — ADVAIR HFA 115-21 MCG/ACT IN AERO
INHALATION_SPRAY | RESPIRATORY_TRACT | 2 refills | Status: DC
  Filled 2020-10-23: qty 12, 30d supply, fill #0

## 2020-10-24 ENCOUNTER — Other Ambulatory Visit (HOSPITAL_COMMUNITY): Payer: Self-pay

## 2020-10-24 DIAGNOSIS — G43839 Menstrual migraine, intractable, without status migrainosus: Secondary | ICD-10-CM | POA: Diagnosis not present

## 2020-10-24 DIAGNOSIS — Z79899 Other long term (current) drug therapy: Secondary | ICD-10-CM | POA: Diagnosis not present

## 2020-10-24 DIAGNOSIS — M791 Myalgia, unspecified site: Secondary | ICD-10-CM | POA: Diagnosis not present

## 2020-10-24 DIAGNOSIS — G43719 Chronic migraine without aura, intractable, without status migrainosus: Secondary | ICD-10-CM | POA: Diagnosis not present

## 2020-10-24 DIAGNOSIS — M542 Cervicalgia: Secondary | ICD-10-CM | POA: Diagnosis not present

## 2020-10-31 ENCOUNTER — Encounter (HOSPITAL_COMMUNITY): Payer: Self-pay

## 2020-10-31 ENCOUNTER — Emergency Department (HOSPITAL_COMMUNITY): Payer: 59

## 2020-10-31 ENCOUNTER — Other Ambulatory Visit: Payer: Self-pay

## 2020-10-31 ENCOUNTER — Emergency Department (HOSPITAL_COMMUNITY)
Admission: EM | Admit: 2020-10-31 | Discharge: 2020-11-01 | Disposition: A | Discharge status: Home / Self Care | Payer: 59 | Attending: Emergency Medicine | Admitting: Emergency Medicine

## 2020-10-31 DIAGNOSIS — I10 Essential (primary) hypertension: Secondary | ICD-10-CM | POA: Insufficient documentation

## 2020-10-31 DIAGNOSIS — J4541 Moderate persistent asthma with (acute) exacerbation: Secondary | ICD-10-CM | POA: Insufficient documentation

## 2020-10-31 DIAGNOSIS — R5383 Other fatigue: Secondary | ICD-10-CM | POA: Diagnosis not present

## 2020-10-31 DIAGNOSIS — Z20822 Contact with and (suspected) exposure to covid-19: Secondary | ICD-10-CM | POA: Diagnosis not present

## 2020-10-31 DIAGNOSIS — Z7951 Long term (current) use of inhaled steroids: Secondary | ICD-10-CM | POA: Insufficient documentation

## 2020-10-31 DIAGNOSIS — R0602 Shortness of breath: Secondary | ICD-10-CM | POA: Diagnosis not present

## 2020-10-31 DIAGNOSIS — R000 Tachycardia, unspecified: Secondary | ICD-10-CM | POA: Insufficient documentation

## 2020-10-31 DIAGNOSIS — Z79899 Other long term (current) drug therapy: Secondary | ICD-10-CM | POA: Insufficient documentation

## 2020-10-31 DIAGNOSIS — R509 Fever, unspecified: Secondary | ICD-10-CM | POA: Diagnosis not present

## 2020-10-31 NOTE — ED Triage Notes (Signed)
Emergency Medicine Provider Triage Evaluation Note  [name] [name] , a 35 y.o. female  was evaluated in triage.  Pt complains of sob.  She was just admitted with anaphyalxis, finished a prednisone taper two days ago. Has experienced persistent exertional dyspnea since hospital discharge.  She went to work and experienced worsening of sob, checked her pulse ox and it was 83%.  Took albuterol prior to ED arrival.  Review of Systems  Positive: Sob, doe Negative: fever  Physical Exam  BP (!) 145/109 (BP Location: Right Arm)   Pulse (!) 127   Temp (!) 100.9 F (38.3 C) (Oral)   Resp 20   LMP 10/01/2020   SpO2 100%  Gen:   Awake, uncomfortable appearing  HEENT:  Atraumatic  Resp:  Normal effort  Cardiac:  tachycardic MSK:   Moves extremities without difficulty  Neuro:  Speech clear   Medical Decision Making  Medically screening exam initiated at 11:35 PM.  Appropriate orders placed.  [name] [name] was informed that the remainder of the evaluation will be completed by another provider, this initial triage assessment does not replace that evaluation, and the importance of remaining in the ED until their evaluation is complete.  Clinical Impression  Sob, needs further evaluation.    Tilden Fossa, MD 10/31/20 2337

## 2020-10-31 NOTE — ED Triage Notes (Signed)
Pt c/o shortness of breath, state she feels tight, 02 sat=83% prior to arrival. Pt had an anaphylaxis reaction on 4/5, pt continued to have shortness of breath after 4 epi, patient was admitted for same. Pt states this does not feel like her anaphylaxis but feels extremely short of breath. 02=100% on room air in triage.

## 2020-11-01 ENCOUNTER — Other Ambulatory Visit (HOSPITAL_COMMUNITY): Payer: Self-pay | Admitting: Emergency Medicine

## 2020-11-01 ENCOUNTER — Other Ambulatory Visit (HOSPITAL_COMMUNITY): Payer: Self-pay

## 2020-11-01 DIAGNOSIS — Z7951 Long term (current) use of inhaled steroids: Secondary | ICD-10-CM | POA: Diagnosis not present

## 2020-11-01 DIAGNOSIS — I10 Essential (primary) hypertension: Secondary | ICD-10-CM | POA: Diagnosis not present

## 2020-11-01 DIAGNOSIS — R5383 Other fatigue: Secondary | ICD-10-CM | POA: Diagnosis not present

## 2020-11-01 DIAGNOSIS — Z20822 Contact with and (suspected) exposure to covid-19: Secondary | ICD-10-CM | POA: Diagnosis not present

## 2020-11-01 DIAGNOSIS — Z79899 Other long term (current) drug therapy: Secondary | ICD-10-CM | POA: Diagnosis not present

## 2020-11-01 DIAGNOSIS — J4541 Moderate persistent asthma with (acute) exacerbation: Secondary | ICD-10-CM | POA: Diagnosis not present

## 2020-11-01 DIAGNOSIS — R000 Tachycardia, unspecified: Secondary | ICD-10-CM | POA: Diagnosis not present

## 2020-11-01 LAB — CBC WITH DIFFERENTIAL/PLATELET
Abs Immature Granulocytes: 0.1 10*3/uL — ABNORMAL HIGH (ref 0.00–0.07)
Basophils Absolute: 0.1 10*3/uL (ref 0.0–0.1)
Basophils Relative: 0 %
Eosinophils Absolute: 0.3 10*3/uL (ref 0.0–0.5)
Eosinophils Relative: 1 %
HCT: 42.7 % (ref 36.0–46.0)
Hemoglobin: 13.9 g/dL (ref 12.0–15.0)
Immature Granulocytes: 1 %
Lymphocytes Relative: 12 %
Lymphs Abs: 2.4 10*3/uL (ref 0.7–4.0)
MCH: 29.1 pg (ref 26.0–34.0)
MCHC: 32.6 g/dL (ref 30.0–36.0)
MCV: 89.5 fL (ref 80.0–100.0)
Monocytes Absolute: 1.2 10*3/uL — ABNORMAL HIGH (ref 0.1–1.0)
Monocytes Relative: 6 %
Neutro Abs: 16 10*3/uL — ABNORMAL HIGH (ref 1.7–7.7)
Neutrophils Relative %: 80 %
Platelets: 303 10*3/uL (ref 150–400)
RBC: 4.77 MIL/uL (ref 3.87–5.11)
RDW: 12.7 % (ref 11.5–15.5)
WBC: 20 10*3/uL — ABNORMAL HIGH (ref 4.0–10.5)
nRBC: 0 % (ref 0.0–0.2)

## 2020-11-01 LAB — URINALYSIS, ROUTINE W REFLEX MICROSCOPIC
Bacteria, UA: NONE SEEN
Bilirubin Urine: NEGATIVE
Glucose, UA: NEGATIVE mg/dL
Hgb urine dipstick: NEGATIVE
Ketones, ur: NEGATIVE mg/dL
Nitrite: NEGATIVE
Protein, ur: NEGATIVE mg/dL
Specific Gravity, Urine: 1.019 (ref 1.005–1.030)
pH: 6 (ref 5.0–8.0)

## 2020-11-01 LAB — PREGNANCY, URINE: Preg Test, Ur: NEGATIVE

## 2020-11-01 LAB — COMPREHENSIVE METABOLIC PANEL
ALT: 30 U/L (ref 0–44)
AST: 24 U/L (ref 15–41)
Albumin: 3.5 g/dL (ref 3.5–5.0)
Alkaline Phosphatase: 56 U/L (ref 38–126)
Anion gap: 10 (ref 5–15)
BUN: 13 mg/dL (ref 6–20)
CO2: 27 mmol/L (ref 22–32)
Calcium: 9.3 mg/dL (ref 8.9–10.3)
Chloride: 101 mmol/L (ref 98–111)
Creatinine, Ser: 1 mg/dL (ref 0.44–1.00)
GFR, Estimated: >60 mL/min (ref >60)
Glucose, Bld: 89 mg/dL (ref 70–99)
Potassium: 3.8 mmol/L (ref 3.5–5.1)
Sodium: 138 mmol/L (ref 135–145)
Total Bilirubin: 0.7 mg/dL (ref 0.3–1.2)
Total Protein: 6.5 g/dL (ref 6.5–8.1)

## 2020-11-01 LAB — RESP PANEL BY RT-PCR (FLU A&B, COVID) ARPGX2
Influenza A by PCR: NEGATIVE
Influenza B by PCR: NEGATIVE
SARS Coronavirus 2 by RT PCR: NEGATIVE

## 2020-11-01 LAB — TROPONIN I (HIGH SENSITIVITY)
Troponin I (High Sensitivity): 4 ng/L (ref <18)
Troponin I (High Sensitivity): 4 ng/L (ref <18)

## 2020-11-01 LAB — D-DIMER, QUANTITATIVE: D-Dimer, Quant: 0.35 ug/mL-FEU (ref 0.00–0.50)

## 2020-11-01 LAB — LACTIC ACID, PLASMA: Lactic Acid, Venous: 1.4 mmol/L (ref 0.5–1.9)

## 2020-11-01 MED ORDER — ONDANSETRON HCL 4 MG/2ML IJ SOLN
INTRAMUSCULAR | Status: AC
  Administered 2020-11-01: 4 mg via INTRAVENOUS
  Filled 2020-11-01: qty 2

## 2020-11-01 MED ORDER — PREDNISONE 50 MG PO TABS
50.0000 mg | ORAL_TABLET | Freq: Every day | ORAL | 0 refills | Status: DC
  Filled 2020-11-01: qty 5, 5d supply, fill #0

## 2020-11-01 MED ORDER — IPRATROPIUM BROMIDE 0.02 % IN SOLN
0.5000 mg | Freq: Once | RESPIRATORY_TRACT | Status: AC
  Administered 2020-11-01: 0.5 mg via RESPIRATORY_TRACT
  Filled 2020-11-01: qty 2.5

## 2020-11-01 MED ORDER — SODIUM CHLORIDE 0.9 % IV BOLUS
1000.0000 mL | Freq: Once | INTRAVENOUS | Status: AC
  Administered 2020-11-01: 1000 mL via INTRAVENOUS

## 2020-11-01 MED ORDER — ALBUTEROL SULFATE (5 MG/ML) 0.5% IN NEBU
2.5000 mg | INHALATION_SOLUTION | Freq: Four times a day (QID) | RESPIRATORY_TRACT | 0 refills | Status: DC | PRN
  Filled 2020-11-01: qty 30, 7d supply, fill #0

## 2020-11-01 MED ORDER — IBUPROFEN 400 MG PO TABS
600.0000 mg | ORAL_TABLET | Freq: Once | ORAL | Status: AC
  Administered 2020-11-01: 600 mg via ORAL
  Filled 2020-11-01: qty 1

## 2020-11-01 MED ORDER — ALBUTEROL SULFATE (2.5 MG/3ML) 0.083% IN NEBU
2.5000 mg | INHALATION_SOLUTION | Freq: Once | RESPIRATORY_TRACT | Status: AC
  Administered 2020-11-01: 2.5 mg via RESPIRATORY_TRACT
  Filled 2020-11-01: qty 3

## 2020-11-01 MED ORDER — IPRATROPIUM-ALBUTEROL 0.5-2.5 (3) MG/3ML IN SOLN
3.0000 mL | Freq: Once | RESPIRATORY_TRACT | Status: AC
  Administered 2020-11-01: 3 mL via RESPIRATORY_TRACT
  Filled 2020-11-01: qty 3

## 2020-11-01 MED ORDER — ALBUTEROL (5 MG/ML) CONTINUOUS INHALATION SOLN
10.0000 mg/h | INHALATION_SOLUTION | Freq: Once | RESPIRATORY_TRACT | Status: AC
  Administered 2020-11-01: 10 mg/h via RESPIRATORY_TRACT
  Filled 2020-11-01: qty 20

## 2020-11-01 MED ORDER — ACETAMINOPHEN 325 MG PO TABS
650.0000 mg | ORAL_TABLET | Freq: Once | ORAL | Status: AC
  Administered 2020-11-01: 650 mg via ORAL
  Filled 2020-11-01: qty 2

## 2020-11-01 MED ORDER — METHYLPREDNISOLONE SODIUM SUCC 125 MG IJ SOLR
125.0000 mg | Freq: Once | INTRAMUSCULAR | Status: AC
  Administered 2020-11-01: 125 mg via INTRAVENOUS
  Filled 2020-11-01: qty 2

## 2020-11-01 MED ORDER — ONDANSETRON HCL 4 MG/2ML IJ SOLN: 4.0000 mg | Freq: Once | INTRAMUSCULAR | Status: AC

## 2020-11-01 NOTE — ED Notes (Signed)
Medications follow up appts reviewed w/ pt. Denies questions or concerns @ this time. Evaluated for asthma flare. Praescriptions sent to verified pharmacy. Pt tx w/ albuterol and duo nebs. tx w/ steroids and 2L bolus. RR WNL. No adventitious lung sounds. Pt tolerated ambulation trial  . Left w/ even and steady gait. NAD noted. PIV removed and VSS.

## 2020-11-01 NOTE — ED Provider Notes (Signed)
[facility] EMERGENCY DEPARTMENT Provider Note   CSN: [id number] Arrival date & time: 10/31/20  2318     History Chief Complaint  Patient presents with  . Shortness of Breath    [name] [name] is a 35 y.o. female.  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  History of anxiety, colitis, migraine headache and hypertension.  Tonight is her first night back at work.  She was recently mid to the hospital April 7 and 9 for anaphylactic biphasic reaction.  She finished steroids 2 days ago.  She felt okay other than fatigue when she was working 7 PM.  She greater became more short of breath with exertional dyspnea and found her pulse ox to be 83%.  She took an albuterol before coming to the ED and her sats improved.  She still feels extremely short of breath and fatigued.  Febrile on ED arrival which she was not aware of.  Has had a dry cough and congestion.  Denies any abdominal pain or vomiting but has had nausea.  No pain with urination or blood in the urine.  Denies any new exposures to allergens which was thought to be mushrooms.  States she still feels short of breath and nauseated and exhausted.  Denies chest pain.  Denies any pain with urination or blood in the urine.  Denies any leg pain or leg swelling.  Did not know that she had a fever.  The history is provided by the patient.  Shortness of Breath Associated symptoms: cough, fever and headaches   Associated symptoms: no abdominal pain, no chest pain, no rash and no vomiting        Past Medical History:  Diagnosis Date  . Anemia   . Anxiety   . Arthritis    knee  . Asthma   . Back pain    lower back  . Colitis   . Complication of anesthesia   . Depression   . Diverticulitis   . Endometriosis 08/20/2017  . GERD (gastroesophageal reflux disease)   . History of kidney stones    passed stones-no surgery required  . Migraine   . Nerve damage    left femoral nerve  . 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 PONV (postoperative nausea and vomiting)   . Seasonal allergies   . Seizures (HCC)    febrile seizure as a child, no problems as aduilt  . Ulcerative colitis [facility])     Patient Active Problem List   Diagnosis Date Noted  . Allergic reaction 10/17/2020  . Anaphylactic reaction 10/17/2020  . HTN (hypertension) 10/17/2020  . GERD (gastroesophageal reflux disease) 10/17/2020  . Anxiety 10/17/2020  . Depression 10/17/2020  . Obesity, Class III, BMI 40-49.9 (morbid obesity) (HCC) 10/17/2020  . Migraine without aura and without status migrainosus, not intractable 03/31/2019  . Endometriosis 08/20/2017  . Diverticular disease 02/10/2017    Past Surgical History:  Procedure Laterality Date  . BOWEL RESECTION  02/10/2017  . BOWEL RESECTION    . CHOLECYSTECTOMY    . COLONOSCOPY    . 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 KNEE SURGERY     left knee  . LAPAROSCOPIC LYSIS OF ADHESIONS N/A 08/20/2017   Procedure: EXTENSIVE LAPAROSCOPIC LYSIS OF ADHESIONS;  Surgeon: Sherian Rein, MD;  Location: [facility];  Service: Gynecology;  Laterality: N/A;  . LAPAROSCOPY N/A 08/20/2017   Procedure: LAPAROSCOPY OPERATIVE, LEFT OVARIAN CYSTECTOMY,  RIGHT PARATUBAL CYSTECTOMY;  Surgeon: Sherian Rein, MD;  Location: [facility];  Service: Gynecology;  Laterality: N/A;  . UPPER GI ENDOSCOPY    . WISDOM TOOTH EXTRACTION       OB History   No obstetric history on file.     History reviewed. No pertinent family history.  Social History   Tobacco Use  . Smoking status: Never Smoker  . Smokeless tobacco: Never Used  Vaping Use  . Vaping Use: Never used  Substance Use Topics  . Alcohol use: Yes    Comment: socially  . Drug use: No    Home Medications Prior to Admission medications   Medication Sig Start Date End Date Taking? Authorizing Provider  acetaminophen (TYLENOL) 500 MG tablet Take 1,000 mg by mouth every 6 (six) hours as needed for  headache.    [provider]  benzonatate (TESSALON) 100 MG capsule Take 1-2 capsules (100-200 mg total) by mouth 3 (three) times daily as needed for cough. Patient taking differently: Take 200 mg by mouth 3 (three) times daily as needed for cough. 10/09/20   Lurene Shadow, PA-C  butorphanol (STADOL) 10 MG/ML nasal spray PLACE 1 SPRAY IN ONE NOSTRIL AS DIRECTED EVERY 6 HOURS IF NEEDED Patient taking differently: Place 1 spray into the nose every 4 (four) hours as needed for migraine. 09/05/20 03/04/21  Mitzi Hansen, NP  cetirizine (ZYRTEC) 10 MG tablet Take 10 mg by mouth daily.    [provider]  chlorpheniramine-HYDROcodone (TUSSIONEX PENNKINETIC ER) 10-8 MG/5ML SUER Take 5 mLs by mouth every 12 (twelve) hours as needed for cough. 10/19/20   Jerald Kief, MD  chlorpheniramine-HYDROcodone (TUSSIONEX) 10-8 MG/5ML SUER Take 5 mls by mouth every 12 hours for 14 days 10/22/20     chlorproMAZINE (THORAZINE) 25 MG tablet Take 1 tablet (25 mg total) by mouth as needed (take one tablet as needed for Headache). Patient taking differently: Take 25 mg by mouth daily as needed (take one tablet as needed for Headache). 08/14/19   Glyn Ade, Scot Jun, PA-C  clobetasol ointment (TEMOVATE) 0.05 % APPLY EXTERNALLY TO AFFECTED AREA(S) TWICE DAILY Patient taking differently: Apply 1 application topically daily as needed (infection). 05/02/20 05/02/21  Axner, Lowanda Foster, PA-C  clonazePAM (KLONOPIN) 0.5 MG tablet Take 0.25 mg by mouth at bedtime as needed for anxiety.    [provider]  cyclobenzaprine (FLEXERIL) 10 MG tablet Take 1 tablet (10 mg total) by mouth 3 (three) times daily as needed for muscle spasms. 03/31/19   Glyn Ade, Scot Jun, PA-C  diphenhydrAMINE (BENADRYL) 50 MG tablet Take 50 mg by mouth daily as needed for itching.    [provider]  EPINEPHrine 0.3 mg/0.3 mL IJ SOAJ injection Inject 0.3 mg into the muscle as needed for anaphylaxis. Patient taking differently:  Inject 0.3 mg into the muscle once as needed for anaphylaxis. 10/15/20   Gilda Crease, MD  escitalopram (LEXAPRO) 20 MG tablet Take 20 mg by mouth daily.    [provider]  famotidine (PEPCID) 20 MG tablet Take 1 tablet (20 mg total) by mouth 2 (two) times daily for 7 days. 10/19/20 10/26/20  Jerald Kief, MD  fluticasone (FLONASE) 50 MCG/ACT nasal spray Place 2 sprays into both nostrils daily. Patient not taking: Reported on 10/17/2020 09/23/17   Waldon Merl, PA-C  fluticasone-salmeterol (ADVAIR [facility]) [phone] MCG/ACT inhaler Inhale 2 puffs by mouth into the lungs twice a day 10/23/20     hydrOXYzine (ATARAX/VISTARIL) 10 MG tablet TAKE  1 TABLET BY MOUTH BEFORE BEDTIME Patient taking differently: Take 10 mg by mouth every 8 (eight) hours as needed for itching or anxiety. 05/02/20 05/02/21  Axner, Lowanda Foster, PA-C  ibuprofen (ADVIL) 200 MG tablet Take 800 mg by mouth every 6 (six) hours as needed for headache.    [provider]  Insulin Pen Needle 31G X 6 MM MISC USE AS DIRECTED WITH SAXENDA 09/06/20 09/06/21  Mitzi Hansen, NP  ipratropium (ATROVENT) 0.03 % nasal spray 2 sprays per nostril 2-3 times daily as needed for congestion and post-nasal drainage Patient not taking: No sig reported 10/09/20   Lurene Shadow, PA-C  labetalol (NORMODYNE) 200 MG tablet Take 200 mg by mouth 2 (two) times daily.    [provider]  Lasmiditan Succinate (REYVOW) 100 MG TABS Take 100 mg by mouth as needed. Patient not taking: No sig reported 08/11/19   Glyn Ade, Scot Jun, PA-C  levalbuterol [facility] HFA) 45 MCG/ACT inhaler Inhale 2 puffs into the lungs every 4 (four) hours as needed for wheezing.    [provider]  metFORMIN (GLUCOPHAGE) 500 MG tablet Take 1,000 mg by mouth 2 (two) times daily. 01/26/19   [provider]  ondansetron (ZOFRAN) 4 MG tablet Take 1 tablet (4 mg total) by mouth every 6 (six) hours as needed for nausea. Patient not taking: Reported on  10/17/2020 02/14/17   Ovidio Kin, MD  pantoprazole (PROTONIX) 20 MG tablet Take 1 tablet (20 mg total) by mouth daily. Patient taking differently: Take 20 mg by mouth at bedtime. 05/04/15   Barrett Henle, PA-C  promethazine (PHENERGAN) 25 MG tablet TAKE 1 TABLET (25 MG TOTAL) BY MOUTH EVERY 6 (SIX) HOURS AS NEEDED FOR NAUSEA OR VOMITING. 11/01/19   Glyn Ade, Scot Jun, PA-C  Rimegepant Sulfate (NURTEC) 75 MG TBDP Take 75 mg by mouth as needed (migraine). Patient taking differently: Take 75 mg by mouth daily as needed (migraine). 03/31/19   Teague Chestine Spore, Scot Jun, PA-C  TOSYMRA 10 MG/ACT SOLN Inject 10 mg into the skin daily as needed (migraine). 09/04/20   [provider]  traMADol (ULTRAM) 50 MG tablet Take 50 mg by mouth 2 (two) times daily as needed for moderate pain. 08/20/20   [provider]    Allergies    Mushroom extract complex, Tylenol with codeine #3 [acetaminophen-codeine], and Betadine [povidone iodine]  Review of Systems   Review of Systems  Constitutional: Positive for fatigue and fever. Negative for activity change and appetite change.  HENT: Positive for congestion and rhinorrhea.   Respiratory: Positive for cough and shortness of breath. Negative for chest tightness.   Cardiovascular: Negative for chest pain.  Gastrointestinal: Positive for nausea. Negative for abdominal pain and vomiting.  Genitourinary: Negative for dysuria.  Musculoskeletal: Positive for arthralgias and myalgias.  Skin: Negative for rash.  Neurological: Positive for weakness and headaches.   all other systems are negative except as noted in the HPI and PMH.    Physical Exam Updated Vital Signs BP 131/81   Pulse (!) 119   Temp 99.1 F (37.3 C) (Oral)   Resp (!) 23   SpO2 96%   Physical Exam Vitals and nursing note reviewed.  Constitutional:      General: She is not in acute distress.    Appearance: She is well-developed. She is obese.     Comments: Fatigued  appearing  HENT:     Head: Normocephalic and atraumatic.     Mouth/Throat:     Pharynx:  No oropharyngeal exudate.     Comments: No tongue swelling or throat swelling. Eyes:     Conjunctiva/sclera: Conjunctivae normal.     Pupils: Pupils are equal, round, and reactive to light.  Neck:     Comments: No meningismus. Cardiovascular:     Rate and Rhythm: Regular rhythm. Tachycardia present.     Heart sounds: Normal heart sounds. No murmur heard.     Comments: Tachycardia 120s Pulmonary:     Effort: Pulmonary effort is normal. No respiratory distress.     Breath sounds: Normal breath sounds. No wheezing.  Abdominal:     Palpations: Abdomen is soft.     Tenderness: There is no abdominal tenderness. There is no guarding or rebound.  Musculoskeletal:        General: No tenderness. Normal range of motion.     Cervical back: Normal range of motion and neck supple.  Skin:    General: Skin is warm.     Capillary Refill: Capillary refill takes less than 2 seconds.     Findings: No rash.  Neurological:     General: No focal deficit present.     Mental Status: She is alert and oriented to person, place, and time. Mental status is at baseline.     Cranial Nerves: No cranial nerve deficit.     Motor: No abnormal muscle tone.     Coordination: Coordination normal.     Comments:  5/5 strength throughout. CN 2-12 intact.Equal grip strength.   Psychiatric:        Behavior: Behavior normal.     ED Results / Procedures / Treatments   Labs (all labs ordered are listed, but only abnormal results are displayed) Labs Reviewed  CBC WITH DIFFERENTIAL/PLATELET - Abnormal; Notable for the following components:      Result Value   WBC 20.0 (*)    Neutro Abs 16.0 (*)    Monocytes Absolute 1.2 (*)    Abs Immature Granulocytes 0.10 (*)    All other components within normal limits  URINALYSIS, ROUTINE W REFLEX MICROSCOPIC - Abnormal; Notable for the following components:   APPearance HAZY (*)     Leukocytes,Ua TRACE (*)    All other components within normal limits  RESP PANEL BY RT-PCR (FLU A&B, COVID) ARPGX2  CULTURE, BLOOD (ROUTINE X 2)  CULTURE, BLOOD (ROUTINE X 2)  COMPREHENSIVE METABOLIC PANEL  LACTIC ACID, PLASMA  D-DIMER, QUANTITATIVE  PREGNANCY, URINE  LACTIC ACID, PLASMA  TROPONIN I (HIGH SENSITIVITY)  TROPONIN I (HIGH SENSITIVITY)    EKG EKG Interpretation  Date/Time:  Thursday October 31 2020 23:39:36 EDT Ventricular Rate:  122 PR Interval:  122 QRS Duration: 72 QT Interval:  312 QTC Calculation: 444 R Axis:   30 Text Interpretation: Sinus tachycardia Anterior infarct , age undetermined Abnormal ECG Confirmed by Tilden Fossa [phone]) on 10/31/2020 11:41:16 PM   Radiology DG Chest 2 View  Result Date: 10/31/2020 CLINICAL DATA:  Shortness of breath and fever EXAM: CHEST - 2 VIEW COMPARISON:  10/17/2020 FINDINGS: The heart size and mediastinal contours are within normal limits. Both lungs are clear. The visualized skeletal structures are unremarkable. IMPRESSION: No active cardiopulmonary disease. Electronically Signed   By: Jasmine Pang M.D.   On: 10/31/2020 23:56    Procedures .Critical Care Performed by: Glynn Octave, MD Authorized by: Glynn Octave, MD   Critical care provider statement:    Critical care time (minutes):  45   Critical care was necessary to treat or prevent imminent or life-threatening  deterioration of the following conditions:  Respiratory failure   Critical care was time spent personally by me on the following activities:  Discussions with consultants, evaluation of patient's response to treatment, examination of patient, ordering and performing treatments and interventions, ordering and review of laboratory studies, ordering and review of radiographic studies, pulse oximetry, re-evaluation of patient's condition, obtaining history from patient or surrogate and review of old charts     Medications Ordered in ED Medications   albuterol (PROVENTIL) (2.5 MG/3ML) 0.083% nebulizer solution 2.5 mg (has no administration in time range)  sodium chloride 0.9 % bolus 1,000 mL (has no administration in time range)  acetaminophen (TYLENOL) tablet 650 mg (has no administration in time range)    ED Course  I have reviewed the triage vital signs and the nursing notes.  Pertinent labs & imaging results that were available during my care of the patient were reviewed by me and considered in my medical decision making (see chart for details).    MDM Rules/Calculators/A&P                         Shortness of breath with hypoxia.  Recent admission for anaphylaxis finished steroids 2 days ago.  EKG is sinus tachycardia.  Febrile to 100.9 on arrival.  Lungs are clear without wheezing.  Chest x-ray is negative for pneumonia.  Patient given IV fluids on arrival.  EKG is sinus tachycardia.  She is also given antipyretics.  Her chest x-ray is negative for pneumonia.  Given her shortness of breath, tachycardia and hypoxia, PE considered and D-dimer is sent.  This is negative.  Fever and heart rate have improved.  Patient feeling improved after nebulizers and steroids.  Her fever and heart rate have improved.  Her COVID test was negative.  X-ray is negative for pneumonia or other acute pathology.  Low suspicion for ACS.  Troponin negative x2.  D-dimer is negative.  Discussed low suspicion for pulmonary embolism in setting of negative D-dimer but this is not 100% sensitive.  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  She received several nebulizer as well as a continuous nebulizer with much improvement.  She is able to ambulate without desaturation.  Lungs are clear.  Suspect likely exacerbation of her asthma contributing to her hypoxia and shortness of breath. We will give her an additional course of steroids, continue bronchodilators at home. Use nebulizer every 4  hours for next 2 days. Continue steroids.  Do not return to work tonight if possible. 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  Return precautions discussed.   [name] [name] was evaluated in Emergency Department on 11/01/2020 for the symptoms described in the history of present illness. She was evaluated in the context of the global COVID-19 pandemic, which necessitated consideration that the patient might be at risk for infection with the SARS-CoV-2 virus that causes COVID-19. Institutional protocols and algorithms that pertain to the evaluation of patients at risk for COVID-19 are in a state of rapid change based on information released by regulatory bodies including the CDC and federal and state organizations. These policies and algorithms were followed during the patient's care in the ED.  Final Clinical Impression(s) / ED Diagnoses Final diagnoses:  Moderate persistent asthma with exacerbation    Rx / DC Orders ED Discharge Orders    None       Vanessa Kampf,  Jeannett SeniorStephen, MD 11/01/20 [phone]

## 2020-11-01 NOTE — ED Notes (Signed)
Pt ambulated around room on cont. Pulse ox o2 sat remained between 94-98%. HR up to 120. Currently ambulating to bathroom.

## 2020-11-01 NOTE — ED Notes (Signed)
Provider @ bedside updating on plan of care and admitting to hospital.

## 2020-11-01 NOTE — ED Notes (Signed)
RT made aware of cont. Neb tx.

## 2020-11-01 NOTE — Discharge Instructions (Addendum)
Take the steroids as prescribed.  Use your nebulizer machine every 4 hours for the next 2 days whether you think you need it or not.  Follow-up with your doctor.  Return to the ED with difficulty breathing, chest pain, tongue swelling, lip swelling, any other concerns.  If develop throat tightness or tongue swelling, chest pain or difficulty breathing you should use your epinephrine pen and then come to the ED immediately.

## 2020-11-02 ENCOUNTER — Other Ambulatory Visit (HOSPITAL_COMMUNITY): Payer: Self-pay

## 2020-11-02 MED ORDER — ALBUTEROL SULFATE (5 MG/ML) 0.5% IN NEBU
2.5000 mg | INHALATION_SOLUTION | Freq: Four times a day (QID) | RESPIRATORY_TRACT | 0 refills | Status: AC | PRN
  Filled 2020-11-02: qty 30, 8d supply, fill #0

## 2020-11-04 ENCOUNTER — Other Ambulatory Visit (HOSPITAL_COMMUNITY): Payer: Self-pay

## 2020-11-05 DIAGNOSIS — J45909 Unspecified asthma, uncomplicated: Secondary | ICD-10-CM | POA: Diagnosis not present

## 2020-11-06 LAB — CULTURE, BLOOD (ROUTINE X 2)
Culture: NO GROWTH
Culture: NO GROWTH

## 2020-11-07 DIAGNOSIS — G43719 Chronic migraine without aura, intractable, without status migrainosus: Secondary | ICD-10-CM | POA: Diagnosis not present

## 2020-11-07 DIAGNOSIS — M542 Cervicalgia: Secondary | ICD-10-CM | POA: Diagnosis not present

## 2020-11-07 DIAGNOSIS — M791 Myalgia, unspecified site: Secondary | ICD-10-CM | POA: Diagnosis not present

## 2020-11-07 DIAGNOSIS — G43839 Menstrual migraine, intractable, without status migrainosus: Secondary | ICD-10-CM | POA: Diagnosis not present

## 2020-11-12 ENCOUNTER — Other Ambulatory Visit (HOSPITAL_COMMUNITY): Payer: Self-pay

## 2020-11-12 MED ORDER — BUTORPHANOL TARTRATE 1 MG/ML IJ SOLN
INTRAMUSCULAR | 1 refills | Status: DC
  Filled 2020-11-12 – 2020-12-02 (×2): qty 1, 30d supply, fill #0

## 2020-11-14 ENCOUNTER — Other Ambulatory Visit (HOSPITAL_COMMUNITY): Payer: Self-pay

## 2020-11-14 DIAGNOSIS — K219 Gastro-esophageal reflux disease without esophagitis: Secondary | ICD-10-CM | POA: Diagnosis not present

## 2020-11-14 DIAGNOSIS — G43909 Migraine, unspecified, not intractable, without status migrainosus: Secondary | ICD-10-CM | POA: Diagnosis not present

## 2020-11-14 DIAGNOSIS — R059 Cough, unspecified: Secondary | ICD-10-CM | POA: Diagnosis not present

## 2020-11-14 MED ORDER — PANTOPRAZOLE SODIUM 20 MG PO TBEC
DELAYED_RELEASE_TABLET | ORAL | 0 refills | Status: DC
  Filled 2020-11-14: qty 90, 90d supply, fill #0

## 2020-11-14 MED ORDER — BUTORPHANOL TARTRATE 10 MG/ML NA SOLN
NASAL | 3 refills | Status: DC
  Filled 2020-11-14: qty 2.5, 30d supply, fill #0
  Filled 2020-11-29 – 2020-12-10 (×3): qty 2.5, 30d supply, fill #1
  Filled 2021-01-09: qty 2.5, 30d supply, fill #2
  Filled 2021-02-04: qty 2.5, 30d supply, fill #3
  Filled ????-??-??: fill #1

## 2020-11-29 ENCOUNTER — Other Ambulatory Visit (HOSPITAL_COMMUNITY): Payer: Self-pay

## 2020-11-29 DIAGNOSIS — T781XXD Other adverse food reactions, not elsewhere classified, subsequent encounter: Secondary | ICD-10-CM | POA: Diagnosis not present

## 2020-11-29 DIAGNOSIS — J4530 Mild persistent asthma, uncomplicated: Secondary | ICD-10-CM | POA: Diagnosis not present

## 2020-11-29 DIAGNOSIS — R21 Rash and other nonspecific skin eruption: Secondary | ICD-10-CM | POA: Diagnosis not present

## 2020-11-29 DIAGNOSIS — J3081 Allergic rhinitis due to animal (cat) (dog) hair and dander: Secondary | ICD-10-CM | POA: Diagnosis not present

## 2020-11-29 MED ORDER — MONTELUKAST SODIUM 10 MG PO TABS
10.0000 mg | ORAL_TABLET | Freq: Every day | ORAL | 3 refills | Status: DC
  Filled 2020-11-29: qty 30, 30d supply, fill #0
  Filled 2020-12-25: qty 30, 30d supply, fill #1
  Filled 2021-02-04: qty 30, 30d supply, fill #2
  Filled 2021-06-13: qty 30, 30d supply, fill #3

## 2020-11-29 MED ORDER — LEVOCETIRIZINE DIHYDROCHLORIDE 5 MG PO TABS
5.0000 mg | ORAL_TABLET | Freq: Every evening | ORAL | 3 refills | Status: DC
  Filled 2020-11-29: qty 30, 30d supply, fill #0
  Filled 2020-12-25: qty 30, 30d supply, fill #1
  Filled 2021-02-04: qty 30, 30d supply, fill #2
  Filled 2021-03-13: qty 30, 30d supply, fill #3

## 2020-11-29 MED ORDER — AZELASTINE HCL 0.1 % NA SOLN
NASAL | 3 refills | Status: DC
  Filled 2020-11-29: qty 30, 25d supply, fill #0
  Filled 2020-12-25: qty 30, 25d supply, fill #1
  Filled 2021-03-13: qty 30, 25d supply, fill #2

## 2020-11-29 MED ORDER — HYDROXYZINE HCL 10 MG PO TABS
10.0000 mg | ORAL_TABLET | Freq: Three times a day (TID) | ORAL | 3 refills | Status: DC
  Filled 2020-11-29: qty 90, 30d supply, fill #0
  Filled 2020-12-25: qty 90, 30d supply, fill #1
  Filled 2021-02-04: qty 90, 30d supply, fill #2
  Filled 2021-03-13: qty 90, 30d supply, fill #3

## 2020-11-30 ENCOUNTER — Other Ambulatory Visit (HOSPITAL_COMMUNITY): Payer: Self-pay

## 2020-12-02 ENCOUNTER — Other Ambulatory Visit (HOSPITAL_COMMUNITY): Payer: Self-pay

## 2020-12-06 ENCOUNTER — Other Ambulatory Visit (HOSPITAL_COMMUNITY): Payer: Self-pay

## 2020-12-06 DIAGNOSIS — T781XXA Other adverse food reactions, not elsewhere classified, initial encounter: Secondary | ICD-10-CM | POA: Diagnosis not present

## 2020-12-06 MED ORDER — BUTORPHANOL TARTRATE 1 MG/ML IJ SOLN
INTRAMUSCULAR | 0 refills | Status: DC
  Filled 2020-12-06: qty 1, 30d supply, fill #0
  Filled 2020-12-25: qty 1, fill #0

## 2020-12-10 ENCOUNTER — Other Ambulatory Visit (HOSPITAL_COMMUNITY): Payer: Self-pay

## 2020-12-25 ENCOUNTER — Other Ambulatory Visit (HOSPITAL_COMMUNITY): Payer: Self-pay

## 2020-12-25 DIAGNOSIS — G43839 Menstrual migraine, intractable, without status migrainosus: Secondary | ICD-10-CM | POA: Diagnosis not present

## 2020-12-25 DIAGNOSIS — M791 Myalgia, unspecified site: Secondary | ICD-10-CM | POA: Diagnosis not present

## 2020-12-25 DIAGNOSIS — G43719 Chronic migraine without aura, intractable, without status migrainosus: Secondary | ICD-10-CM | POA: Diagnosis not present

## 2020-12-25 DIAGNOSIS — M542 Cervicalgia: Secondary | ICD-10-CM | POA: Diagnosis not present

## 2020-12-26 ENCOUNTER — Other Ambulatory Visit (HOSPITAL_COMMUNITY): Payer: Self-pay

## 2020-12-26 MED ORDER — METHOCARBAMOL 750 MG PO TABS
750.0000 mg | ORAL_TABLET | Freq: Three times a day (TID) | ORAL | 0 refills | Status: DC | PRN
  Filled 2020-12-26: qty 50, 9d supply, fill #0

## 2020-12-26 MED ORDER — METHYLPREDNISOLONE 4 MG PO TBPK
ORAL_TABLET | ORAL | 0 refills | Status: DC
  Filled 2020-12-26: qty 21, 6d supply, fill #0

## 2021-01-02 ENCOUNTER — Other Ambulatory Visit (HOSPITAL_COMMUNITY): Payer: Self-pay

## 2021-01-02 MED ORDER — TRAMADOL HCL 50 MG PO TABS
50.0000 mg | ORAL_TABLET | Freq: Three times a day (TID) | ORAL | 0 refills | Status: DC | PRN
  Filled 2021-01-02: qty 30, 10d supply, fill #0

## 2021-01-03 ENCOUNTER — Encounter: Payer: Self-pay | Admitting: Physical Therapy

## 2021-01-03 ENCOUNTER — Other Ambulatory Visit: Payer: Self-pay

## 2021-01-03 ENCOUNTER — Ambulatory Visit: Payer: PRIVATE HEALTH INSURANCE | Attending: Occupational Medicine | Admitting: Physical Therapy

## 2021-01-03 DIAGNOSIS — M545 Low back pain, unspecified: Secondary | ICD-10-CM | POA: Insufficient documentation

## 2021-01-03 DIAGNOSIS — R2689 Other abnormalities of gait and mobility: Secondary | ICD-10-CM | POA: Diagnosis present

## 2021-01-03 NOTE — Therapy (Addendum)
[facility] [address] [place], [place], [address] Phone: [phone]   Fax:  [phone]  Physical Therapy Evaluation  Patient Details  Name: [name] [name] MRN: [id number] Date of Birth: 11-Sep-1985 Referring Provider (PT): Logan Bores, MD  Encounter Date: 01/03/2021   PT End of Session - 01/03/21 1132     Visit Number 1    Number of Visits 7    Date for PT Re-Evaluation 02/28/21    Authorization Type WC (7 visits including eval) FOTO 6th and 10th    Authorization - Visit Number --   same as visit number   Authorization - Number of Visits 7    PT Start Time 1000    PT Stop Time 1045    PT Time Calculation (min) 45 min    Equipment Utilized During Treatment Gait belt    Activity Tolerance Patient tolerated treatment well    Behavior During Therapy [facility] for tasks assessed/performed             Past Medical History:  Diagnosis Date   Anemia    Anxiety    Arthritis    knee   Asthma    Back pain    lower back   Colitis    Complication of anesthesia    Depression    Diverticulitis    Endometriosis 08/20/2017   GERD (gastroesophageal reflux disease)    History of kidney stones    passed stones-no surgery required   Migraine    Nerve damage    left femoral nerve   Neuromuscular disorder (HCC)    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    PONV (postoperative nausea and vomiting)    Seasonal allergies    Seizures (HCC)    febrile seizure as a child, no problems as aduilt   Ulcerative colitis (HCC)     Past Surgical History:  Procedure Laterality Date   BOWEL RESECTION  02/10/2017   BOWEL RESECTION     CHOLECYSTECTOMY     COLONOSCOPY     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    Subjective Assessment - 01/03/21 1015     Subjective [name] [name] is a 35 y.o. female who presents to clinic with chief complaint of low back pain R>L.  MOI/History of condition: moving 1/2 full birthing pool (bending and twisting).  Sudden onset of pain on 6/6.  No shooting pain down legs.  Pain location: R sided low back pain, both along spine, R PSIS, and QL region.  Red flags: none.  48 hour pain intensity:  highest 7/10, current 4/10, best 3/10.  Aggs: sitting (2-3 hours), standing (30 min), lifting, leg extended in car (R).  Eases: icing, laying down (1 hour to calm down).  Nature: achy  Severity: moderate  Irritability: mod    Stage: acute/subacute.  Stability: staying the same.  24 hour pattern: better in the morning (worse  with activity)  Vocation/requirements: nurse (out currently, light duty on return), requires lifting.  Hobbies: weight lifter, swimming.  Functional limitations/goals: getting back to normal activty.  Home environment: lives with husband and step son, lives in house 3 STE, single story living area.  Assistive device: none.    Pertinent History L femoral nerve palsy, asthma                [facility] PT Assessment - 01/03/21 0001       Assessment   Medical Diagnosis Strain of muscle, fascia and tendon of lower back, initial encounter (G62.694W)    Referring Provider (PT) Logan Bores, MD    Onset Date/Surgical Date 12/16/20    Next MD Visit unknown    Prior Therapy none      Precautions   Precaution Comments asthma      Restrictions   Other Position/Activity Restrictions none      Balance Screen   Has the patient fallen in the past 6 months No       Functional Tests   Functional tests Single leg stance;Other;Other2      Other:   Other/ Comments heelwalk - * pain, toe walk WNL      ROM / Strength   AROM / PROM / Strength AROM;Strength      AROM   AROM Assessment Site Lumbar    Lumbar Flexion mid shin with *P!    Lumbar Extension WNL with *P!    Lumbar - Right Side Bend WNL    Lumbar - Left Side Bend WNL with *P!    Lumbar - Right Rotation WFL    Lumbar - Left Rotation [facility]      Strength   Strength Assessment Site Knee;Hip;Ankle    Right/Left Knee Right;Left      Flexibility   Soft Tissue Assessment /Muscle Length yes    Hamstrings WNL      Palpation   Spinal mobility pain over R PSIS, tenderness with PA throuhout lumbar spine    SI assessment  compression relieving    Palpation comment tenderness R QL and lumbar paraspinals      Special Tests   Other special tests slump and SLR (-)                        Objective measurements completed on examination: See above findings.               PT Education - 01/03/21 1122     Education Details SI belt, HEP, prognosis, diagnosis    Person(s) Educated Patient    Methods Explanation;Demonstration;Handout    Comprehension Verbalized understanding              PT Short Term Goals - 01/03/21 1140       PT SHORT TERM GOAL #1   Title [name] [name] will be >75% HEP compliant within 3 weeks to improve carryover between sessions and facilitate independent management of condition.    Target Date 01/24/21               PT Long Term Goals - 01/03/21 1141       PT LONG TERM GOAL #1   Title [name] [name] will improve FOTO score from 53 to 66 as a proxy for functional improvement    Target Date 02/28/21      PT LONG TERM GOAL #2   Title [name] [name] will be able to return to work (full duty), not  limited by pain  EVAL: unable    Target Date 02/28/21      PT LONG TERM GOAL #3   Title [name] [name] will be able to resume modified  weight lifting, not limited by pain  EVAL: unable    Target Date 02/28/21                    Plan - 01/03/21 1137     Clinical Impression Statement [name] [name] is a 35 y.o. female who presents to clinic with signs and sxs consistent with lumbar strain with irritation of R SI joint.  No signs of radiculopathy at this time.  R sided QL muscle strain with concurrent R SI irritation.  SI compression is relieving, suggested she get SI belt.  Pt presents with pain and notable deficits in: lumbar ROM.  Pt is limited functionally in weight lifting, work, and swimming.  Pt will benefit from skilled therapy to address pain and the listed deficits in order to achieve functional goals, enable safety and independence in completion of daily tasks, and return to PLOF.    Personal Factors and Comorbidities Profession    Examination-Activity Limitations Bend;Lift;Squat;Locomotion Level;Stand;Sit    Examination-Participation Restrictions Community Activity;Occupation    Stability/Clinical Decision Making Stable/Uncomplicated    Clinical Decision Making Low    Rehab Potential Good    PT Frequency 2x / week    PT Duration 8 weeks    PT Treatment/Interventions ADLs/Self Care Home Management;Aquatic Therapy;Moist Heat;Gait training;Therapeutic activities;Therapeutic exercise;Neuromuscular re-education;Manual techniques;Dry needling;Spinal Manipulations    PT Next Visit Plan assess HEP, SI belt, gentle lumbar stretching and strengthening, TDN?    PT Home Exercise Plan [email or web address]    Consulted and Agree with Plan of Care Patient             Patient will benefit from skilled therapeutic intervention in order to improve the following deficits and impairments:  Pain, Decreased activity tolerance, Difficulty walking, Decreased range of motion  Visit Diagnosis: Acute right-sided low back pain without sciatica  Other abnormalities of gait and mobility     Problem List Patient Active Problem List    Diagnosis Date Noted   Allergic reaction 10/17/2020   Anaphylactic reaction 10/17/2020   HTN (hypertension) 10/17/2020   GERD (gastroesophageal reflux disease) 10/17/2020   Anxiety 10/17/2020   Depression 10/17/2020   Obesity, Class III, BMI 40-49.9 (morbid obesity) (HCC) 10/17/2020   Migraine without aura and without status migrainosus, not intractable 03/31/2019   Endometriosis 08/20/2017   Diverticular disease 02/10/2017    Alphonzo Severance PT, DPT 01/03/21 11:43 AM  [facility] [address] [place], [place], [address] Phone: [phone]   Fax:  [phone]  Name: [name] [name] MRN: [id number] Date of Birth: Aug 31, 1985

## 2021-01-03 NOTE — Patient Instructions (Addendum)
Access Code: [email or web address] URL: [email or web address] Date: 01/03/2021 Prepared by: Alphonzo Severance  Exercises Supine Lower Trunk Rotation - 3 x daily - 7 x weekly - 1 sets - 20 reps - 3 hold Calf Mobilization with Small Ball - 2 x daily - 7 x weekly - 1 hold Heat - PRN - 7 x weekly

## 2021-01-09 ENCOUNTER — Other Ambulatory Visit: Payer: Self-pay

## 2021-01-10 ENCOUNTER — Other Ambulatory Visit: Payer: Self-pay

## 2021-01-10 ENCOUNTER — Other Ambulatory Visit (HOSPITAL_COMMUNITY): Payer: Self-pay

## 2021-01-10 ENCOUNTER — Ambulatory Visit: Payer: PRIVATE HEALTH INSURANCE | Attending: Occupational Medicine | Admitting: Physical Therapy

## 2021-01-10 ENCOUNTER — Encounter: Payer: Self-pay | Admitting: Physical Therapy

## 2021-01-10 DIAGNOSIS — R2689 Other abnormalities of gait and mobility: Secondary | ICD-10-CM | POA: Diagnosis present

## 2021-01-10 DIAGNOSIS — M545 Low back pain, unspecified: Secondary | ICD-10-CM | POA: Diagnosis present

## 2021-01-10 NOTE — Patient Instructions (Signed)
Access Code: [email or web address] URL: [email or web address] Date: 01/10/2021 Prepared by: Alphonzo Severance  Exercises Supine Lower Trunk Rotation - 3 x daily - 7 x weekly - 1 sets - 20 reps - 3 hold Calf Mobilization with Small Ball - 2 x daily - 7 x weekly - 1 hold Heat - 1 x daily - 7 x weekly - 3 sets - 10 reps Hooklying Clamshell with Resistance - 1 x daily - 7 x weekly - 3 sets - 10 reps

## 2021-01-10 NOTE — Therapy (Signed)
[facility] Out[facility] [address] [place], [place], [address] Phone: [phone]   Fax:  [phone]  Physical Therapy Treatment  Patient Details  Name: Chelsee Hosie MRN: [id number] Date of Birth: 1985/10/22 Referring Provider (PT): Logan Bores, MD   Encounter Date: 01/10/2021   PT End of Session - 01/10/21 1046     Visit Number 2    Number of Visits 7    Date for PT Re-Evaluation 02/28/21    Authorization Type WC (7 visits including eval) FOTO 6th and 10th    Authorization - Visit Number --   same as visit number   Authorization - Number of Visits 7    PT Start Time 1045    PT Stop Time 1129    PT Time Calculation (min) 44 min    Equipment Utilized During Treatment Gait belt    Activity Tolerance Patient tolerated treatment well    Behavior During Therapy [facility] for tasks assessed/performed              Past Medical History:  Diagnosis Date   Anemia    Anxiety    Arthritis    knee   Asthma    Back pain    lower back   Colitis    Complication of anesthesia    Depression    Diverticulitis    Endometriosis 08/20/2017   GERD (gastroesophageal reflux disease)    History of kidney stones    passed stones-no surgery required   Migraine    Nerve damage    left femoral nerve   Neuromuscular disorder (HCC)    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    PONV (postoperative nausea and vomiting)    Seasonal allergies    Seizures (HCC)    febrile seizure as a child, no problems as aduilt   Ulcerative colitis (HCC)     Past Surgical History:  Procedure Laterality Date   BOWEL RESECTION  02/10/2017   BOWEL RESECTION     CHOLECYSTECTOMY     COLONOSCOPY     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   Subjective Assessment - 01/10/21 1048     Subjective Pt reports that she aquired an SI belt, which is very helpful.  She has been trying to wean from her pain medication, and as a result is feeling increased pain today.  Pain is 2-3/10 with medication.  She was also sitting for hours in a car yesterday which was an agg.    Pertinent History L femoral nerve palsy, asthma    Currently in Pain? Yes    Pain Score 5     Pain Location Back    Pain Orientation Right                               [facility] Adult PT Treatment/Exercise - 01/10/21 0001  Exercises   Exercises Lumbar;Knee/Hip      Lumbar Exercises: Stretches   Lower Trunk Rotation Limitations 20x    Pelvic Tilt Limitations with foam roller 3x15  5'' x10      Lumbar Exercises: Aerobic   Nustep 5' L5 while taking subjective      Lumbar Exercises: Supine   Clam Limitations 3x10 with blue TB alternating      Knee/Hip Exercises: Supine   Hip Adduction Isometric Limitations 5'' 2x10      Manual Therapy   Manual Therapy Myofascial release;Joint mobilization    Joint Mobilization Grade 5 neutral gapping pt in R S/L    Myofascial Release TPR QL, pt in S/L                      PT Short Term Goals - 01/03/21 1140       PT SHORT TERM GOAL #1   Title Dauna Ziska will be >75% HEP compliant within 3 weeks to improve carryover between sessions and facilitate independent management of condition.    Target Date 01/24/21               PT Long Term Goals - 01/03/21 1141       PT LONG TERM GOAL #1   Title Loryn Haacke will improve FOTO score from 53 to 66 as a proxy for functional  improvement    Target Date 02/28/21      PT LONG TERM GOAL #2   Title Tyese Finken will be able to return to work (full duty), not limited by pain  EVAL: unable    Target Date 02/28/21      PT LONG TERM GOAL #3   Title Ayisha Pol will be able to resume modified weight lifting, not limited by pain  EVAL: unable    Target Date 02/28/21                   Plan - 01/10/21 1145     Clinical Impression Statement Pt reports mild pain reduction following therapy  HEP was updated and reissued to patient    Overall, Kariss Longmire is progressing well with therapy.  Today we concentrated on core strengthening, hip strengthening, and pain reduction.  Pt tolerated S/L neutral gapping fairly well in R s/l but was not able to tolerate positioning for L S/L.  TPR of R QL and paraspinals was reliving.  Consider trial of TDN.  Pt will continue to benefit from skilled physical therapy to address remaining deficits and achieve listed goals.  Continue per POC.    Personal Factors and Comorbidities Profession    Examination-Activity Limitations Bend;Lift;Squat;Locomotion Level;Stand;Sit    Examination-Participation Restrictions Community Activity;Occupation    Stability/Clinical Decision Making Stable/Uncomplicated    Rehab Potential Good    PT Frequency 2x / week    PT Duration 8 weeks    PT Treatment/Interventions ADLs/Self Care Home Management;Aquatic Therapy;Moist Heat;Gait training;Therapeutic activities;Therapeutic exercise;Neuromuscular re-education;Manual techniques;Dry needling;Spinal Manipulations    PT Next Visit Plan assess HEP, SI belt, gentle lumbar stretching and strengthening, TDN?    PT Home Exercise Plan [email or web address]    Consulted and Agree with Plan of Care Patient             Patient will benefit from skilled therapeutic intervention in order to improve the following deficits and impairments:  Pain, Decreased activity tolerance, Difficulty walking, Decreased range of  motion  Visit Diagnosis: Acute right-sided low back pain without sciatica  Other abnormalities  of gait and mobility     Problem List Patient Active Problem List   Diagnosis Date Noted   Allergic reaction 10/17/2020   Anaphylactic reaction 10/17/2020   HTN (hypertension) 10/17/2020   GERD (gastroesophageal reflux disease) 10/17/2020   Anxiety 10/17/2020   Depression 10/17/2020   Obesity, Class III, BMI 40-49.9 (morbid obesity) (HCC) 10/17/2020   Migraine without aura and without status migrainosus, not intractable 03/31/2019   Endometriosis 08/20/2017   Diverticular disease 02/10/2017    Alphonzo Severance PT, DPT 01/10/21 11:47 AM  [facility] [address] [place], [place], [address] Phone: [phone]   Fax:  [phone]  Name: Jocelynn Gioffre MRN: [id number] Date of Birth: [birth year]

## 2021-01-14 ENCOUNTER — Other Ambulatory Visit (HOSPITAL_COMMUNITY): Payer: Self-pay

## 2021-01-14 ENCOUNTER — Ambulatory Visit: Payer: PRIVATE HEALTH INSURANCE | Admitting: Physical Therapy

## 2021-01-15 ENCOUNTER — Other Ambulatory Visit (HOSPITAL_COMMUNITY): Payer: Self-pay

## 2021-01-15 ENCOUNTER — Other Ambulatory Visit: Payer: Self-pay

## 2021-01-16 ENCOUNTER — Other Ambulatory Visit (HOSPITAL_COMMUNITY): Payer: Self-pay

## 2021-01-17 ENCOUNTER — Encounter: Payer: Self-pay | Admitting: Physical Therapy

## 2021-01-17 ENCOUNTER — Other Ambulatory Visit: Payer: Self-pay

## 2021-01-17 ENCOUNTER — Ambulatory Visit: Payer: PRIVATE HEALTH INSURANCE | Admitting: Physical Therapy

## 2021-01-17 DIAGNOSIS — M545 Low back pain, unspecified: Secondary | ICD-10-CM | POA: Diagnosis not present

## 2021-01-17 DIAGNOSIS — R2689 Other abnormalities of gait and mobility: Secondary | ICD-10-CM

## 2021-01-17 NOTE — Therapy (Signed)
[facility] Out[facility] Rehabilitation [facility] [address] [place], [place], [address] Phone: [phone]   Fax:  [phone]  Physical Therapy Treatment  Patient Details  Name: [name] [name] MRN: [id number] Date of Birth: [birth year] Referring Provider (PT): Logan Bores, MD   Encounter Date: 01/17/2021   PT End of Session - 01/17/21 1100     Visit Number 3    Number of Visits 7    Date for PT Re-Evaluation 02/28/21    Authorization Type WC (7 visits including eval) FOTO 6th and 10th    PT Start Time 1053    PT Stop Time 1145    PT Time Calculation (min) 52 min    Activity Tolerance Patient tolerated treatment well    Behavior During Therapy [facility] for tasks assessed/performed             Past Medical History:  Diagnosis Date   Anemia    Anxiety    Arthritis    knee   Asthma    Back pain    lower back   Colitis    Complication of anesthesia    Depression    Diverticulitis    Endometriosis 08/20/2017   GERD (gastroesophageal reflux disease)    History of kidney stones    passed stones-no surgery required   Migraine    Nerve damage    left femoral nerve   Neuromuscular disorder (HCC)    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    PONV (postoperative nausea and vomiting)    Seasonal allergies    Seizures (HCC)    febrile seizure as a child, no problems as aduilt   Ulcerative colitis (HCC)     Past Surgical History:  Procedure Laterality Date   BOWEL RESECTION  02/10/2017   BOWEL RESECTION     CHOLECYSTECTOMY     COLONOSCOPY     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   Subjective Assessment - 01/17/21 1055     Subjective She is having increased pain today, she bent forward this AM and got "stuck" and had to lay for a bit to get unstuck.    Currently in Pain? Yes    Pain Score 5     Pain Location Back    Pain Orientation Right;Lower    Pain Descriptors / Indicators Aching;Sharp;Other (Comment)                [facility] Adult PT Treatment/Exercise - 01/17/21 0001       Lumbar Exercises: Stretches   Lower Trunk Rotation 10 seconds    Lower Trunk Rotation Limitations x 10      Lumbar Exercises: Supine   Ab Set 10 reps    AB Set Limitations exhale with ball squeeze TrA    Clam 20 reps    Clam Limitations alt with green band      Lumbar Exercises: Sidelying   Clam Right;20 reps  Other Sidelying Lumbar Exercises transverse abd x 10      Lumbar Exercises: Prone   Single Arm Raise --    Straight Leg Raise --    Opposite Arm/Leg Raise --      Lumbar Exercises: Quadruped   Madcat/Old Horse 5 reps    Single Arm Raise 5 reps    Straight Leg Raise 5 reps    Opposite Arm/Leg Raise 5 reps    Other Quadruped Lumbar Exercises childs pose R/T      Manual Therapy   Manual therapy comments Trigger point to Rt quadratus lumborum, lumbar paraspinals, glute medius    Myofascial Release Rt trunk in sidelying                      PT Short Term Goals - 01/03/21 1140       PT SHORT TERM GOAL #1   Title [name] [name] will be >75% HEP compliant within 3 weeks to improve carryover between sessions and facilitate independent management of condition.    Target Date 01/24/21               PT Long Term Goals - 01/03/21 1141       PT LONG TERM GOAL #1   Title [name] [name] will improve FOTO score from 53 to 66 as a proxy for functional improvement    Target Date 02/28/21       PT LONG TERM GOAL #2   Title [name] [name] will be able to return to work (full duty), not limited by pain  EVAL: unable    Target Date 02/28/21      PT LONG TERM GOAL #3   Title [name] [name] will be able to resume modified weight lifting, not limited by pain  EVAL: unable    Target Date 02/28/21                   Plan - 01/17/21 1101     Clinical Impression Statement Patient with a flare up due to poor lifting mechanics at home, bending forward vs hinging. She needed reminders for this as well when transitioning from mat to sitting.  Initiated core exercises and breathing to reduce spasm in Rt Quadratus lumborum.  Scheduled her for dry needling to benefit overall function but not for 2 weeks.    PT Treatment/Interventions ADLs/Self Care Home Management;Aquatic Therapy;Moist Heat;Gait training;Therapeutic activities;Therapeutic exercise;Neuromuscular re-education;Manual techniques;Dry needling;Spinal Manipulations    PT Next Visit Plan core HEP, body mechanics info and hinging. SI belt, gentle lumbar stretching and strengthening, TDN?    PT Home Exercise Plan [email or web address]    Consulted and Agree with Plan of Care Patient             Patient will benefit from skilled therapeutic intervention in order to improve the following deficits and impairments:  Pain, Decreased activity tolerance, Difficulty walking, Decreased range of motion  Visit Diagnosis: Other abnormalities of gait and mobility  Acute right-sided low back pain without sciatica     Problem List Patient Active Problem List   Diagnosis Date Noted   Allergic reaction 10/17/2020   Anaphylactic reaction 10/17/2020   HTN (hypertension) 10/17/2020   GERD (gastroesophageal reflux disease) 10/17/2020   Anxiety 10/17/2020   Depression 10/17/2020   Obesity, Class III, BMI 40-49.9 (morbid obesity) (HCC) 10/17/2020   Migraine without aura and without status migrainosus, not intractable 03/31/2019   Endometriosis  08/20/2017   Diverticular disease 02/10/2017    [name] [name] 01/17/2021, 1:06  PM  [facility] Outpatient Rehabilitation [facility] [address] [place], [place], [address] Phone: [phone]   Fax:  [phone]  Name: [name] [name] MRN: [id number] Date of Birth: 03-02-1986   Karie Mainland, PT 01/17/21 1:06 PM Phone: [phone] Fax: [phone]

## 2021-01-19 DIAGNOSIS — M545 Low back pain, unspecified: Secondary | ICD-10-CM | POA: Diagnosis present

## 2021-01-19 DIAGNOSIS — R2689 Other abnormalities of gait and mobility: Secondary | ICD-10-CM | POA: Diagnosis present

## 2021-01-20 ENCOUNTER — Other Ambulatory Visit (HOSPITAL_COMMUNITY): Payer: Self-pay

## 2021-01-20 ENCOUNTER — Other Ambulatory Visit: Payer: Self-pay

## 2021-01-21 ENCOUNTER — Encounter: Payer: Self-pay | Admitting: Physical Therapy

## 2021-01-21 ENCOUNTER — Other Ambulatory Visit (HOSPITAL_COMMUNITY): Payer: Self-pay

## 2021-01-21 ENCOUNTER — Ambulatory Visit: Payer: PRIVATE HEALTH INSURANCE | Admitting: Physical Therapy

## 2021-01-21 ENCOUNTER — Other Ambulatory Visit: Payer: Self-pay

## 2021-01-21 DIAGNOSIS — M545 Low back pain, unspecified: Secondary | ICD-10-CM | POA: Diagnosis not present

## 2021-01-21 DIAGNOSIS — R2689 Other abnormalities of gait and mobility: Secondary | ICD-10-CM

## 2021-01-21 NOTE — Therapy (Signed)
[facility] [address] [place], [place], [address] Phone: [phone]   Fax:  [phone]  Physical Therapy Treatment  Patient Details  Name: [name] [name] MRN: [id number] Date of Birth: 19-Jan-1986 Referring Provider (PT): Logan Bores, MD   Encounter Date: 01/21/2021   PT End of Session - 01/21/21 0811     Visit Number 4    Number of Visits 7    Date for PT Re-Evaluation 02/28/21    Authorization Type WC (7 visits including eval) FOTO 6th and 10th    PT Start Time 0801    PT Stop Time 0856    PT Time Calculation (min) 55 min    Activity Tolerance Patient tolerated treatment well    Behavior During Therapy [facility] for tasks assessed/performed             Past Medical History:  Diagnosis Date   Anemia    Anxiety    Arthritis    knee   Asthma    Back pain    lower back   Colitis    Complication of anesthesia    Depression    Diverticulitis    Endometriosis 08/20/2017   GERD (gastroesophageal reflux disease)    History of kidney stones    passed stones-no surgery required   Migraine    Nerve damage    left femoral nerve   Neuromuscular disorder (HCC)    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    PONV (postoperative nausea and vomiting)    Seasonal allergies    Seizures (HCC)    febrile seizure as a child, no problems as aduilt   Ulcerative colitis (HCC)     Past Surgical History:  Procedure Laterality Date   BOWEL RESECTION  02/10/2017   BOWEL RESECTION     CHOLECYSTECTOMY     COLONOSCOPY     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   Subjective Assessment - 01/21/21 0757     Subjective 4/10 discomfort, stiffness this AM.  More than usual.  I feel like it will loosen up. After last session was sore for 1 -2 days.    Currently in Pain? Yes    Pain Score 4     Pain Location Back    Pain Orientation Right;Left;Lower    Pain Descriptors / Indicators Sore;Tightness    Pain Type Chronic pain    Pain Onset More than a month ago    Pain Frequency Intermittent    Aggravating Factors  not sure    Pain Relieving Factors stretching, heat, meds    Effect of Pain on Daily Activities affects work ability                   [facility] Adult PT Treatment/Exercise - 01/21/21 0001       Lumbar Exercises: Stretches   Lower Trunk Rotation 10 seconds    Lower Trunk Rotation  Limitations x 10    Lumbar Stabilization Level 1 Limitations unilateral clam and knee lift x 15 each    Figure 4 Stretch 5 reps;10 seconds    Figure 4 Stretch Limitations add lower trunk rotation      Lumbar Exercises: Standing   Lifting Limitations hip hinge, blue band, then 8 lb DB deadlift with min cues      Lumbar Exercises: Seated   Other Seated Lumbar Exercises forward lean with physio ball      Lumbar Exercises: Supine   Ab Set 10 reps    AB Set Limitations exhale with ball squeeze TrA    Pelvic Tilt 10 reps    Bridge with Ball Squeeze 10 reps      Lumbar Exercises: Quadruped   Other Quadruped Lumbar Exercises childs pose R/T      Cryotherapy   Number Minutes Cryotherapy 10 Minutes    Cryotherapy Location Lumbar Spine    Type of Cryotherapy Ice pack      Manual Therapy   Soft tissue mobilization lumbar paraspinals and thoracolumbar    Myofascial Release lumbar                      PT Short Term Goals - 01/21/21 0923       PT SHORT TERM GOAL #1   Title [name] [name] will be >75% HEP compliant within 3 weeks to improve carryover between sessions and facilitate independent management of condition.    Status Achieved               PT Long Term Goals - 01/21/21 0923       PT LONG TERM GOAL #1   Title [name] [name] will improve FOTO score from 53 to 66 as a proxy for functional improvement    Status Unable to assess      PT LONG TERM GOAL #2   Title [name] [name] will be able to return to work (full duty), not limited by pain  EVAL: unable    Status On-going      PT LONG TERM GOAL #3   Title [name] [name] will be able to resume modified weight lifting, not limited by pain  EVAL: unable    Baseline min pain with light hip hinge    Status On-going                   Plan - 01/21/21 0913     Clinical Impression Statement Patient continues to have bilateral low back pain and spasm.  She has been doing her stretches and taking time to stand and walk at work.  She is looking forward to her dry needling session. Soft tissue was painful but reduced pain post session along with ice.    PT Treatment/Interventions ADLs/Self Care Home Management;Aquatic Therapy;Moist Heat;Gait training;Therapeutic activities;Therapeutic exercise;Neuromuscular re-education;Manual techniques;Dry needling;Spinal Manipulations    PT Next Visit Plan core HEP, body mechanics info and hinging. SI belt, gentle lumbar stretching and strengthening, TDN?    PT Home Exercise Plan [id number]    Consulted and Agree with Plan of Care Patient             Patient will benefit from skilled therapeutic intervention in order to improve the following deficits and impairments:  Pain, Decreased activity tolerance, Difficulty walking, Decreased range of motion  Visit Diagnosis: Other abnormalities of gait and mobility  Acute right-sided low back pain without sciatica     Problem List Patient Active Problem List  Diagnosis Date Noted   Allergic reaction 10/17/2020    Anaphylactic reaction 10/17/2020   HTN (hypertension) 10/17/2020   GERD (gastroesophageal reflux disease) 10/17/2020   Anxiety 10/17/2020   Depression 10/17/2020   Obesity, Class III, BMI 40-49.9 (morbid obesity) (HCC) 10/17/2020   Migraine without aura and without status migrainosus, not intractable 03/31/2019   Endometriosis 08/20/2017   Diverticular disease 02/10/2017    [name] [name] 01/21/2021, 9:24 AM  [facility] [address] [place], [place], [address] Phone: [phone]   Fax:  [phone]  Name: [name] [name] MRN: [id number] Date of Birth: 15-May-1986   Karie Mainland, PT 01/21/21 9:24 AM Phone: [phone] Fax: [phone]

## 2021-01-23 ENCOUNTER — Other Ambulatory Visit (HOSPITAL_COMMUNITY): Payer: Self-pay

## 2021-01-27 ENCOUNTER — Other Ambulatory Visit: Payer: Self-pay

## 2021-01-27 ENCOUNTER — Ambulatory Visit: Payer: PRIVATE HEALTH INSURANCE | Attending: Family Medicine

## 2021-01-27 DIAGNOSIS — R2689 Other abnormalities of gait and mobility: Secondary | ICD-10-CM

## 2021-01-27 DIAGNOSIS — M545 Low back pain, unspecified: Secondary | ICD-10-CM | POA: Diagnosis present

## 2021-01-27 NOTE — Therapy (Signed)
[facility] [address] [place], [place], [address] Phone: [phone]   Fax:  [phone]  Physical Therapy Treatment  Patient Details  Name: Ragen Laver MRN: [id number] Date of Birth: [birth year] Referring Provider (PT): Logan Bores, MD   Encounter Date: 01/27/2021   PT End of Session - 01/27/21 1208     Visit Number 5    Number of Visits 7    Date for PT Re-Evaluation 02/28/21    Authorization Type WC (7 visits including eval) FOTO 6th and 10th    PT Start Time 0846    PT Stop Time 0929    PT Time Calculation (min) 43 min    Activity Tolerance Patient tolerated treatment well    Behavior During Therapy [facility] for tasks assessed/performed             Past Medical History:  Diagnosis Date   Anemia    Anxiety    Arthritis    knee   Asthma    Back pain    lower back   Colitis    Complication of anesthesia    Depression    Diverticulitis    Endometriosis 08/20/2017   GERD (gastroesophageal reflux disease)    History of kidney stones    passed stones-no surgery required   Migraine    Nerve damage    left femoral nerve   Neuromuscular disorder (HCC)    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    PONV (postoperative nausea and vomiting)    Seasonal allergies    Seizures (HCC)    febrile seizure as a child, no problems as aduilt   Ulcerative colitis (HCC)     Past Surgical History:  Procedure Laterality Date   BOWEL RESECTION  02/10/2017   BOWEL RESECTION     CHOLECYSTECTOMY     COLONOSCOPY     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   Subjective Assessment - 01/27/21 1202     Subjective The patient reports that her low back is irritating today.  Yesterday was a bad day.  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    Currently in Pain? Yes    Pain Score 3     Pain Location Back    Pain Orientation Lower                [facility] PT Assessment - 01/27/21 0001       Assessment   Medical Diagnosis Strain of muscle, fascia and tendon of lower back, initial encounter (L89.373S)    Referring Provider (PT) Logan Bores, MD    Onset Date/Surgical Date 12/16/20                           [facility] Adult PT Treatment/Exercise - 01/27/21 0001       Lumbar Exercises: Stretches   Lower Trunk Rotation 10  seconds    Lower Trunk Rotation Limitations x 10      Lumbar Exercises: Supine   Pelvic Tilt 15 reps    Bridge with Ball Squeeze 20 reps      Lumbar Exercises: Sidelying   Clam Right;Left;20 reps      Lumbar Exercises: Quadruped   Plank 10" x 5 (elbows and knees)    Other Quadruped Lumbar Exercises childs pose R/T    Other Quadruped Lumbar Exercises donkey Kick x 15 each      Manual Therapy   Manual therapy comments manual palpation and assessment during [facility]    Soft tissue mobilization lumbar paraspinals and thoracolumbar              Trigger Point Dry Needling - 01/27/21 0001     Consent Given? Yes   Patient educated on benefits and risks of FDN prior to giving consent   Dry Needling Comments manual palpation and assessment during FDN    Erector spinae Response Palpable increased muscle length    Lumbar multifidi Response Palpable increased muscle length    Quadratus Lumborum Response Twitch response elicited                    PT Short Term Goals - 01/21/21 0923       PT SHORT TERM GOAL #1   Title Abbott Pao will be >75%  HEP compliant within 3 weeks to improve carryover between sessions and facilitate independent management of condition.    Status Achieved               PT Long Term Goals - 01/21/21 0923       PT LONG TERM GOAL #1   Title Aprile Dickenson will improve FOTO score from 53 to 66 as a proxy for functional improvement    Status Unable to assess      PT LONG TERM GOAL #2   Title Daneille Desilva will be able to return to work (full duty), not limited by pain  EVAL: unable    Status On-going      PT LONG TERM GOAL #3   Title Harlym Gehling will be able to resume modified weight lifting, not limited by pain  EVAL: unable    Baseline min pain with light hip hinge    Status On-going                   Plan - 01/27/21 1209     Clinical Impression Statement The patient arrived to therapy reporting a 2-3/10 pain in the low back.  The patient did have incresae of tenderenss on the left side of the mid lumbar spine today.  Proceeded with FDN today.  The patient gave consent and she tolerated it well.  She did get the twitch response in the QL bilaterally.  Progressed core stabilization with plank on elbows and knees.  Quadruped bent knee lifts were also performed.  The patient has visible muscle shaking due to fatigue and weakness.  Recommend continued therapy for return to work without restrictions.    Personal Factors and Comorbidities Profession    Examination-Activity Limitations Bend;Lift;Squat;Locomotion Level;Stand;Sit    Examination-Participation Restrictions Community Activity;Occupation    PT Next Visit Plan FOTO for 6th visit, core HEP, body mechanics info and hinging. SI belt, gentle lumbar stretching and strengthening, TDN?    PT Home Exercise Plan [id number]    Consulted and Agree with Plan of Care Patient  Patient will benefit from skilled therapeutic intervention in order to improve the following deficits and impairments:  Pain, Decreased activity tolerance,  Difficulty walking, Decreased range of motion  Visit Diagnosis: Other abnormalities of gait and mobility  Acute right-sided low back pain without sciatica     Problem List Patient Active Problem List   Diagnosis Date Noted   Allergic reaction 10/17/2020   Anaphylactic reaction 10/17/2020   HTN (hypertension) 10/17/2020   GERD (gastroesophageal reflux disease) 10/17/2020   Anxiety 10/17/2020   Depression 10/17/2020   Obesity, Class III, BMI 40-49.9 (morbid obesity) (HCC) 10/17/2020   Migraine without aura and without status migrainosus, not intractable 03/31/2019   Endometriosis 08/20/2017   Diverticular disease 02/10/2017   Sharol Roussel, PT, DPT, OCS, Crt. DN  Robet Leu 01/27/2021, 12:12 PM  [facility] [address] [place], [place], [address] Phone: [phone]   Fax:  [phone]  Name: Laberta Wilbon MRN: [id number] Date of Birth: 1985/11/01

## 2021-01-28 ENCOUNTER — Other Ambulatory Visit: Payer: Self-pay | Admitting: Occupational Medicine

## 2021-01-28 ENCOUNTER — Ambulatory Visit (INDEPENDENT_AMBULATORY_CARE_PROVIDER_SITE_OTHER): Payer: Self-pay

## 2021-01-28 DIAGNOSIS — M545 Low back pain, unspecified: Secondary | ICD-10-CM

## 2021-01-30 ENCOUNTER — Ambulatory Visit: Payer: PRIVATE HEALTH INSURANCE | Admitting: Physical Therapy

## 2021-02-04 ENCOUNTER — Other Ambulatory Visit (HOSPITAL_COMMUNITY): Payer: Self-pay

## 2021-02-04 ENCOUNTER — Encounter: Payer: Self-pay | Admitting: Physical Therapy

## 2021-02-04 ENCOUNTER — Other Ambulatory Visit: Payer: Self-pay

## 2021-02-04 ENCOUNTER — Ambulatory Visit: Payer: 59 | Attending: Family Medicine | Admitting: Physical Therapy

## 2021-02-04 DIAGNOSIS — R2689 Other abnormalities of gait and mobility: Secondary | ICD-10-CM | POA: Insufficient documentation

## 2021-02-04 DIAGNOSIS — M545 Low back pain, unspecified: Secondary | ICD-10-CM | POA: Diagnosis not present

## 2021-02-04 MED ORDER — PANTOPRAZOLE SODIUM 20 MG PO TBEC
DELAYED_RELEASE_TABLET | ORAL | 0 refills | Status: DC
  Filled 2021-02-04: qty 90, 90d supply, fill #0

## 2021-02-04 NOTE — Therapy (Signed)
[facility] [address] [place], [place], [address] Phone: [phone]   Fax:  [phone]  Physical Therapy Treatment  Patient Details  Name: [name] [name] MRN: [id number] Date of Birth: [birth year] Referring Provider (PT): Logan Bores, MD   Encounter Date: 02/04/2021   PT End of Session - 02/04/21 0954     Visit Number 6    Number of Visits 7    Date for PT Re-Evaluation 02/28/21    Authorization Type WC (7 visits including eval) FOTO 6th and 10th    PT Start Time 1000    PT Stop Time 1042    PT Time Calculation (min) 42 min    Activity Tolerance Patient tolerated treatment well    Behavior During Therapy [facility] for tasks assessed/performed             Past Medical History:  Diagnosis Date   Anemia    Anxiety    Arthritis    knee   Asthma    Back pain    lower back   Colitis    Complication of anesthesia    Depression    Diverticulitis    Endometriosis 08/20/2017   GERD (gastroesophageal reflux disease)    History of kidney stones    passed stones-no surgery required   Migraine    Nerve damage    left femoral nerve   Neuromuscular disorder (HCC)    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    PONV (postoperative nausea and vomiting)    Seasonal allergies    Seizures (HCC)    febrile seizure as a child, no problems as aduilt   Ulcerative colitis (HCC)     Past Surgical History:  Procedure Laterality Date   BOWEL RESECTION  02/10/2017   BOWEL RESECTION     CHOLECYSTECTOMY     COLONOSCOPY     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   Subjective Assessment - 02/04/21 1004     Subjective Pt reports that she is fairly well today.  She has been HEP compliant.  She feels she is improving.  She feels that core strengthening has been "key".    Pertinent History L femoral nerve palsy, asthma    Currently in Pain? Yes    Pain Score 3     Pain Location Back    Pain Orientation Lower                                [facility] Adult PT Treatment/Exercise - 02/04/21 0001       Lumbar Exercises: Stretches   Lower Trunk Rotation Limitations x 10    Figure 4 Stretch 5 reps;10 seconds    Figure 4 Stretch Limitations add lower trunk rotation    Other Lumbar Stretch Exercise child's pose - L/R/C      Lumbar Exercises: Aerobic  Nustep 5' L5 while taking subjective      Lumbar Exercises: Standing   Lifting Limitations hip hinge - 3x5 from 8'' box 25@#      Lumbar Exercises: Supine   Pelvic Tilt 15 reps    Dead Bug Limitations 3x8    Bridge Limitations with GTB ER - x10 moving to alternating march x10      Lumbar Exercises: Sidelying   Clam Right;Left;20 reps    Clam Limitations GTB      Lumbar Exercises: Quadruped   Madcat/Old Horse Limitations 10x    Opposite Arm/Leg Raise Limitations 10x, 10x w/ 5'' hold    Other Quadruped Lumbar Exercises thread the needle    Other Quadruped Lumbar Exercises donkey Kick x 15 each                      PT Short Term Goals - 01/21/21 0923       PT SHORT TERM GOAL #1   Title Abbott Pao will be >75% HEP compliant within 3 weeks to improve carryover between sessions and facilitate independent management of condition.    Status Achieved               PT Long Term Goals - 02/04/21 1004       PT LONG TERM GOAL #1   Title Clotiel Troop will improve FOTO score from 53 to 66 as a proxy for  functional improvement    Status Unable to assess      PT LONG TERM GOAL #2   Title Jennamarie Goings will be able to return to work (full duty), not limited by pain  EVAL: unable    Status On-going      PT LONG TERM GOAL #3   Title Querida Beretta will be able to resume modified weight lifting, not limited by pain  EVAL: unable    Baseline min pain with light hip hinge    Status On-going                   Plan - 02/04/21 1021     Clinical Impression Statement Pt reports no increase in baseline pain following therapy  HEP was reviewed with patient, but left unchanged    Overall, [name] is progressing well with therapy.  Today we concentrated on core strengthening and hip strengthening.  Pt shows ability to increase intensity with hip hinge.  Concentration on higher weight from box vs full ROM.  Pt will continue to benefit from skilled physical therapy to address remaining deficits and achieve listed goals.  Continue per POC.    Personal Factors and Comorbidities Profession    Examination-Activity Limitations Bend;Lift;Squat;Locomotion Level;Stand;Sit    Examination-Participation Restrictions Community Activity;Occupation    PT Next Visit Plan FOTO for 6th visit, core HEP, body mechanics info and hinging. SI belt, gentle lumbar stretching and strengthening, TDN?    PT Home Exercise Plan [id number]    Consulted and Agree with Plan of Care Patient             Patient will benefit from skilled therapeutic intervention in order to improve the following deficits and impairments:  Pain, Decreased activity tolerance, Difficulty walking, Decreased range of motion  Visit Diagnosis: Other abnormalities of gait and mobility  Acute right-sided low back pain without sciatica     Problem List Patient Active Problem List   Diagnosis Date Noted   Allergic reaction 10/17/2020   Anaphylactic reaction 10/17/2020   HTN (hypertension) 10/17/2020  GERD (gastroesophageal reflux  disease) 10/17/2020   Anxiety 10/17/2020   Depression 10/17/2020   Obesity, Class III, BMI 40-49.9 (morbid obesity) (HCC) 10/17/2020   Migraine without aura and without status migrainosus, not intractable 03/31/2019   Endometriosis 08/20/2017   Diverticular disease 02/10/2017    Fredderick [name] 02/04/2021, 10:43 AM  [facility] [address] [place], [place], [address] Phone: [phone]   Fax:  [phone]  Name: Samanthamarie Ezzell MRN: [id number] Date of Birth: 16-May-1986

## 2021-02-05 ENCOUNTER — Other Ambulatory Visit (HOSPITAL_COMMUNITY): Payer: Self-pay

## 2021-02-12 ENCOUNTER — Ambulatory Visit: Payer: 59 | Admitting: Physical Therapy

## 2021-02-14 ENCOUNTER — Ambulatory Visit: Payer: 59 | Admitting: Physical Therapy

## 2021-02-19 ENCOUNTER — Other Ambulatory Visit (HOSPITAL_COMMUNITY): Payer: Self-pay

## 2021-02-19 MED ORDER — BUTORPHANOL TARTRATE 10 MG/ML NA SOLN
NASAL | 1 refills | Status: DC
  Filled 2021-03-13: qty 2.5, 30d supply, fill #0
  Filled 2021-04-14: qty 2.5, 30d supply, fill #1

## 2021-02-19 MED ORDER — BUTORPHANOL TARTRATE 1 MG/ML IJ SOLN
INTRAMUSCULAR | 0 refills | Status: DC
  Filled 2021-02-19: qty 1, 30d supply, fill #0

## 2021-02-21 ENCOUNTER — Other Ambulatory Visit (HOSPITAL_COMMUNITY): Payer: Self-pay | Admitting: Orthopedic Surgery

## 2021-02-21 ENCOUNTER — Other Ambulatory Visit: Payer: Self-pay | Admitting: Orthopedic Surgery

## 2021-02-21 DIAGNOSIS — M545 Low back pain, unspecified: Secondary | ICD-10-CM

## 2021-02-26 DIAGNOSIS — M542 Cervicalgia: Secondary | ICD-10-CM | POA: Diagnosis not present

## 2021-02-26 DIAGNOSIS — M791 Myalgia, unspecified site: Secondary | ICD-10-CM | POA: Diagnosis not present

## 2021-02-26 DIAGNOSIS — G43719 Chronic migraine without aura, intractable, without status migrainosus: Secondary | ICD-10-CM | POA: Diagnosis not present

## 2021-02-26 DIAGNOSIS — G43839 Menstrual migraine, intractable, without status migrainosus: Secondary | ICD-10-CM | POA: Diagnosis not present

## 2021-03-01 ENCOUNTER — Ambulatory Visit (HOSPITAL_COMMUNITY)
Admission: RE | Admit: 2021-03-01 | Discharge: 2021-03-01 | Disposition: A | Discharge status: Home / Self Care | Payer: PRIVATE HEALTH INSURANCE | Source: Ambulatory Visit | Attending: Orthopedic Surgery | Admitting: Orthopedic Surgery

## 2021-03-01 ENCOUNTER — Other Ambulatory Visit: Payer: Self-pay

## 2021-03-01 DIAGNOSIS — M545 Low back pain, unspecified: Secondary | ICD-10-CM | POA: Diagnosis present

## 2021-03-03 DIAGNOSIS — Z111 Encounter for screening for respiratory tuberculosis: Secondary | ICD-10-CM | POA: Diagnosis not present

## 2021-03-04 ENCOUNTER — Other Ambulatory Visit: Payer: Self-pay

## 2021-03-04 ENCOUNTER — Ambulatory Visit: Payer: PRIVATE HEALTH INSURANCE | Attending: Orthopedic Surgery | Admitting: Physical Therapy

## 2021-03-04 ENCOUNTER — Ambulatory Visit: Payer: PRIVATE HEALTH INSURANCE | Admitting: Physical Therapy

## 2021-03-04 DIAGNOSIS — M545 Low back pain, unspecified: Secondary | ICD-10-CM | POA: Diagnosis present

## 2021-03-04 DIAGNOSIS — R2689 Other abnormalities of gait and mobility: Secondary | ICD-10-CM | POA: Insufficient documentation

## 2021-03-04 NOTE — Addendum Note (Signed)
Addended by: Karie Mainland L on: 03/04/2021 11:50 AM   Modules accepted: Orders

## 2021-03-04 NOTE — Therapy (Signed)
[facility] [address] [place], [place], [address] Phone: [phone]   Fax:  [phone]  Physical Therapy Treatment/Re-Evaluation  Patient Details  Name: [name] [name] MRN: [id number] Date of Birth: Jul 19, 1985 Referring Provider (PT): Logan Bores, MD   Encounter Date: 03/04/2021   PT End of Session - 03/04/21 1032     Visit Number 7    Number of Visits 15    Date for PT Re-Evaluation 04/15/21    PT Start Time 1025    PT Stop Time 1115    PT Time Calculation (min) 50 min    Activity Tolerance Patient tolerated treatment well    Behavior During Therapy [facility] for tasks assessed/performed             Past Medical History:  Diagnosis Date   Anemia    Anxiety    Arthritis    knee   Asthma    Back pain    lower back   Colitis    Complication of anesthesia    Depression    Diverticulitis    Endometriosis 08/20/2017   GERD (gastroesophageal reflux disease)    History of kidney stones    passed stones-no surgery required   Migraine    Nerve damage    left femoral nerve   Neuromuscular disorder (HCC)    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    PONV (postoperative nausea and vomiting)    Seasonal allergies    Seizures (HCC)    febrile seizure as a child, no problems as aduilt   Ulcerative colitis (HCC)     Past Surgical History:  Procedure Laterality Date   BOWEL RESECTION  02/10/2017   BOWEL RESECTION     CHOLECYSTECTOMY     COLONOSCOPY     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  Feels like a ball that needs to be punched.  Still doing core exercises.  Is muscular.  MRI was read today.    Limitations Sitting;Lifting;Standing;Walking;House hold activities   i dont sleep   Diagnostic tests Isolated lumbar disc degeneration at L5-S1 with annular fissure, but  no discrete disc herniation, spinal stenosis or neural impingement.    Patient Stated Goals Pain relief.  Do my usual gym work    Currently in Pain? Yes    Pain Score 4     Pain Location Back    Pain Orientation Left;Right;Lower    Pain Descriptors / Indicators Aching;Sore;Tightness    Pain Type Chronic pain    Pain Onset More than a month ago    Pain Frequency Intermittent                [facility] PT Assessment - 03/04/21 0001  AROM   Lumbar Flexion WNL    Lumbar Extension WNL with mid range pain    Lumbar - Right Side Bend WNL    Lumbar - Left Side Bend WNL    Lumbar - Right Rotation WNL    Lumbar - Left Rotation WNL      Strength   Overall Strength Comments normal throughout             [facility] Adult PT Treatment/Exercise:  Therapeutic Exercise: - Supine post tilt with ball under sacrum x 15  Added unilat. Clam x 15 each   Then march x 15  Supine 6 lbs dumbell single arm horizonal abd x 10 each side Supine SLR with overhead lift 6 lbs x 10 each leg  Prone child's pose for manual   Manual Therapy: - IASTM quadruped and prone bilateral paraspinals lumbar  Rt side more painful and with palpable "pockets" of air under skin , sore but well tolerated  Modalities:  Cold pack prone lumbar x 10 min   Self-care/Home Management: - Cont POC        PT Education - 03/04/21 1114     Education Details progress, core, HEP  and renewal    Person(s) Educated Patient    Methods Explanation;Verbal cues    Comprehension Verbalized understanding;Returned demonstration              PT Short Term Goals - 03/04/21 1044       PT SHORT TERM GOAL #1   Title Laryah Neuser will be >75% HEP compliant within 3 weeks to improve carryover between sessions and facilitate independent management of condition.    Status Achieved               PT Long Term Goals - 03/04/21 1044       PT LONG TERM GOAL #1   Title Noreene Boreman will improve FOTO score from 41 to 66 as a proxy for functional improvement    Baseline 55    Period Weeks    Status On-going    Target Date 04/15/21      PT LONG TERM GOAL #2   Title Jaleia Hanke will be able to return to work (full duty), not limited by pain  EVAL: unable    Status On-going    Target Date 04/15/21      PT LONG TERM GOAL #3   Title Jerzy Crotteau will be able to resume modified weight lifting, not limited by pain  EVAL: unable    Status On-going    Target Date 04/15/21      PT LONG TERM GOAL #4   Title Pt will be able to show Independence with stabilization exercises for long term mgmt of symptoms    Time 6    Period Weeks    Status New    Target Date 04/15/21                   Plan - 03/04/21 1035     Clinical Impression Statement Patietn cont to have min to mod pain in lumbar spine on a daily basis.  She does wake up some days with very little pain .  With more activity her pain increases, non-specific and over a length of time.  She is hoping to gain consistency with PT intervention, reducing pain and gaining the ability to resume normal work and exercise schedule. Her FOTO score was  improved at 55%, trending towards improvement.   Overall has normal trunk  ROM and LE strength. She will benefit from continued PT to improve core stability and activity tolerance.    Personal Factors and Comorbidities Profession    Examination-Activity Limitations  Bend;Lift;Squat;Locomotion Level;Stand;Sit    Examination-Participation Restrictions Community Activity;Occupation    Stability/Clinical Decision Making Stable/Uncomplicated    Clinical Decision Making Low    Rehab Potential Good    PT Frequency 2x / week    PT Duration 6 weeks   8 visits in 6 weeks   PT Treatment/Interventions ADLs/Self Care Home Management;Aquatic Therapy;Moist Heat;Gait training;Therapeutic activities;Therapeutic exercise;Neuromuscular re-education;Manual techniques;Dry needling;Spinal Manipulations    PT Next Visit Plan core HEP, body mechanics info and hinging. SI belt, gentle lumbar stretching and strengthening, TDN?    PT Home Exercise Plan [email or web address]    Consulted and Agree with Plan of Care Patient             Patient will benefit from skilled therapeutic intervention in order to improve the following deficits and impairments:  Pain, Decreased activity tolerance, Difficulty walking, Decreased range of motion, Decreased strength, Decreased mobility, Obesity  Visit Diagnosis: Other abnormalities of gait and mobility  Acute low back pain without sciatica, unspecified back pain laterality     Problem List Patient Active Problem List   Diagnosis Date Noted   Allergic reaction 10/17/2020   Anaphylactic reaction 10/17/2020   HTN (hypertension) 10/17/2020   GERD (gastroesophageal reflux disease) 10/17/2020   Anxiety 10/17/2020   Depression 10/17/2020   Obesity, Class III, BMI 40-49.9 (morbid obesity) (HCC) 10/17/2020   Migraine without aura and without status migrainosus, not intractable 03/31/2019   Endometriosis 08/20/2017   Diverticular disease 02/10/2017    Nena Hampe 03/04/2021, 11:21 AM  [facility] [address] [place], [place], [address] Phone: [phone]   Fax:  [phone]  Name: Aniaya Bacha MRN: [id number] Date of Birth: [birth year]   Karie Mainland, PT 03/04/21 11:25 AM Phone:  [phone] Fax: [phone]

## 2021-03-07 ENCOUNTER — Ambulatory Visit: Payer: PRIVATE HEALTH INSURANCE | Admitting: Physical Therapy

## 2021-03-11 ENCOUNTER — Encounter: Payer: Self-pay | Admitting: Physical Therapy

## 2021-03-11 ENCOUNTER — Ambulatory Visit: Payer: 59 | Attending: Occupational Medicine | Admitting: Physical Therapy

## 2021-03-11 ENCOUNTER — Other Ambulatory Visit: Payer: Self-pay

## 2021-03-11 DIAGNOSIS — R2689 Other abnormalities of gait and mobility: Secondary | ICD-10-CM | POA: Diagnosis not present

## 2021-03-11 DIAGNOSIS — M545 Low back pain, unspecified: Secondary | ICD-10-CM | POA: Diagnosis not present

## 2021-03-11 NOTE — Therapy (Signed)
[place], [place], [address] Phone: [phone]   Fax:  [phone]  Physical Therapy Treatment  Patient Details  Name: [name] [name] MRN: [id number] Date of Birth: [birth year] Referring Provider (PT): Lossie Faes, MD   Encounter Date: 03/11/2021   PT End of Session - 03/11/21 1220     Visit Number 8    Number of Visits 15    Date for PT Re-Evaluation 04/15/21    Authorization Type WC (7 visits including eval) FOTO 6th and 10th    PT Start Time 1100    PT Stop Time 1145    PT Time Calculation (min) 45 min    Activity Tolerance Patient tolerated treatment well    Behavior During Therapy [facility] for tasks assessed/performed             Past Medical History:  Diagnosis Date   Anemia    Anxiety    Arthritis    knee   Asthma    Back pain    lower back   Colitis    Complication of anesthesia    Depression    Diverticulitis    Endometriosis 08/20/2017   GERD (gastroesophageal reflux disease)    History of kidney stones    passed stones-no surgery required   Migraine    Nerve damage    left femoral nerve   Neuromuscular disorder (HCC)    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    PONV (postoperative nausea and vomiting)    Seasonal allergies    Seizures ([place])    febrile seizure as a child, no problems as aduilt   Ulcerative colitis ([place])     Past Surgical History:  Procedure Laterality Date   BOWEL RESECTION  02/10/2017   BOWEL RESECTION     CHOLECYSTECTOMY     COLONOSCOPY     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   Subjective Assessment - 03/11/21 1105     Subjective " I am about have a 3/10, pain low back and to the R side."    Diagnostic tests Isolated lumbar disc degeneration at L5-S1 with annular fissure, but  no discrete disc herniation, spinal stenosis or neural impingement.    Currently in Pain? Yes    Pain Score 3                 [facility] PT Assessment - 03/11/21 0001       Assessment   Medical Diagnosis Strain of muscle, fascia and tendon of lower back, initial encounter (F63.846K)    Referring Provider (PT) Lossie Faes, MD                           [facility] Adult PT Treatment/Exercise - 03/11/21 0001       Knee/Hip Exercises: Supine   Hip Adduction Isometric Strengthening;Both;10 reps;1 set  holding 10 seconds ea   Straight Leg Raises 1 set   12 reps     Knee/Hip Exercises: Sidelying   Hip ABduction Strengthening;Right;1 set   12 in L sidelying     Manual Therapy   Manual Therapy Muscle Energy Technique    Manual therapy comments skilled palpation and monitoring of pt during TPDN    Joint Mobilization LLE LAD grade 4    Soft tissue mobilization IASTM along the glute med on the R    Muscle Energy Technique R hip flexor and L hip extneions 6 x 10 second hold              Trigger Point Dry Needling - 03/11/21 0001     Consent Given? Yes    Muscles Treated Back/Hip Gluteus minimus;Gluteus medius    Electrical Stimulation Performed with Dry Needling Yes    E-stim with Dry Needling Details CPS 20 LVL, x 8 min increasing tolerance PRN.    Gluteus Minimus Response Twitch response elicited   R   Gluteus Medius Response Twitch response elicited;Palpable increased muscle length   R                 PT Education - 03/11/21 1212     Education Details Reviewed and updated HEP    Person(s)  Educated Patient    Methods Explanation;Verbal cues;Handout    Comprehension Verbalized understanding;Verbal cues required              PT Short Term Goals - 03/04/21 1044       PT SHORT TERM GOAL #1   Title Terryl Niziolek will be >75% HEP compliant within 3 weeks to improve carryover between sessions and facilitate independent management of condition.    Status Achieved               PT Long Term Goals - 03/04/21 1044       PT LONG TERM GOAL #1   Title Aryella Besecker will improve FOTO score from 41 to 66 as a proxy for functional improvement    Baseline 55    Period Weeks    Status On-going    Target Date 04/15/21      PT LONG TERM GOAL #2   Title Acasia Skilton will be able to return to work (full duty), not limited by pain  EVAL: unable    Status On-going    Target Date 04/15/21      PT LONG TERM GOAL #3   Title Jaylea Plourde will be able to resume modified weight lifting, not limited by pain  EVAL: unable    Status On-going    Target Date 04/15/21      PT LONG TERM GOAL #4   Title Pt will be able to show Independence with stabilization exercises for long term mgmt of symptoms    Time 6    Period Weeks    Status New    Target Date 04/15/21                   Plan - 03/11/21 1213     Clinical Impression Statement [name] arrives to session reporting continued pain rated at 3/10 today. Further assessment revealed potential R posteriorly positiong innominate in combination with an inflare. She responded well to hamstring stretching and R hip flexor MET which. continued TPDN combined with E-stim for the R glute med followed with IASTM.  She responded well to hip strengthening with focus on adductor activation. End of  session she noted feeling looser and with some soreness likely related to the DN.    PT Treatment/Interventions ADLs/Self Care Home Management;Aquatic Therapy;Moist Heat;Gait training;Therapeutic activities;Therapeutic exercise;Neuromuscular  re-education;Manual techniques;Dry needling;Spinal Manipulations    PT Next Visit Plan core HEP, body mechanics info and hinging, gentle lumbar stretching and strengthening, response to TPDN, potential SIJ involvement hamstring stretch > flexor MET on the R, STW For glute med    PT Home Exercise Plan [id number]    Consulted and Agree with Plan of Care Patient             Patient will benefit from skilled therapeutic intervention in order to improve the following deficits and impairments:  Pain, Decreased activity tolerance, Difficulty walking, Decreased range of motion, Decreased strength, Decreased mobility, Obesity  Visit Diagnosis: Other abnormalities of gait and mobility  Acute low back pain without sciatica, unspecified back pain laterality  Acute right-sided low back pain without sciatica     Problem List Patient Active Problem List   Diagnosis Date Noted   Allergic reaction 10/17/2020   Anaphylactic reaction 10/17/2020   HTN (hypertension) 10/17/2020   GERD (gastroesophageal reflux disease) 10/17/2020   Anxiety 10/17/2020   Depression 10/17/2020   Obesity, Class III, BMI 40-49.9 (morbid obesity) ([place]) 10/17/2020   Migraine without aura and without status migrainosus, not intractable 03/31/2019   Endometriosis 08/20/2017   Diverticular disease 02/10/2017   Starr Lake PT, DPT, LAT, ATC  03/11/21  12:21 PM      [facility] [address] [place], [place], [address] Phone: [phone]   Fax:  [phone]  Name: Laelia Angelo MRN: [id number] Date of Birth: June 18, 1986

## 2021-03-13 ENCOUNTER — Encounter: Payer: Self-pay | Admitting: Physical Therapy

## 2021-03-13 ENCOUNTER — Other Ambulatory Visit: Payer: Self-pay

## 2021-03-13 ENCOUNTER — Ambulatory Visit: Payer: PRIVATE HEALTH INSURANCE | Attending: Family Medicine | Admitting: Physical Therapy

## 2021-03-13 ENCOUNTER — Other Ambulatory Visit (HOSPITAL_COMMUNITY): Payer: Self-pay

## 2021-03-13 DIAGNOSIS — M545 Low back pain, unspecified: Secondary | ICD-10-CM

## 2021-03-13 DIAGNOSIS — R2689 Other abnormalities of gait and mobility: Secondary | ICD-10-CM

## 2021-03-13 NOTE — Therapy (Signed)
[facility] [address] [place], [place], [address] Phone: [phone]   Fax:  [phone]  Physical Therapy Treatment  Patient Details  Name: [name] [name] MRN: [id number] Date of Birth: 02-17-1986 Referring Provider ([name] [name]): Logan Bores, MD   Encounter Date: 03/13/2021   [name] [name] End of Session - 03/13/21 0915     Visit Number 9    Number of Visits 15    Date for [name] [name] Re-Evaluation 04/15/21    Authorization Type WC (7 visits including eval) FOTO 6th and 10th    [name] [name] Start Time 0915    [name] [name] Stop Time 1000    [name] [name] Time Calculation (min) 45 min    Activity Tolerance Patient tolerated treatment well    Behavior During Therapy [facility] for tasks assessed/performed             Past Medical History:  Diagnosis Date   Anemia    Anxiety    Arthritis    knee   Asthma    Back pain    lower back   Colitis    Complication of anesthesia    Depression    Diverticulitis    Endometriosis 08/20/2017   GERD (gastroesophageal reflux disease)    History of kidney stones    passed stones-no surgery required   Migraine    Nerve damage    left femoral nerve   Neuromuscular disorder (HCC)    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    PONV (postoperative nausea and vomiting)    Seasonal allergies    Seizures (HCC)    febrile seizure as a child, no problems as aduilt   Ulcerative colitis (HCC)     Past Surgical History:  Procedure Laterality Date   BOWEL RESECTION  02/10/2017   BOWEL RESECTION     CHOLECYSTECTOMY     COLONOSCOPY     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   Subjective Assessment - 03/13/21 0919     Subjective [name] [name] feels like overall she is showing good improvement.  She reports TDN was very helpful last session.  She has 2/10 r sided LBP today.  Aggs: standing still  Eases: stretching, heat    Pertinent History L femoral nerve palsy, asthma    Diagnostic tests Isolated lumbar disc degeneration at L5-S1 with annular fissure, but  no discrete disc herniation, spinal stenosis or neural impingement.            Therapeutic Exercise to improve core and hip strength to reduce pain.  [name] [name] cued for form and pacing throughout: - LTR - 20x - Supine post tilt with ball under sacrum x 15             - Added unilat. Alternating clam RTB 2x10              - Then march 2x10 (not today)  - Pilates ring hip adduction 3'' 2x10 -  Resisted PPT with belt and foam roller 2x10  - Supine 7 lbs dumbell single arm horizonal abd x 10 each side - Supine SLR (alternating with ankles on foam roller) with overhead lift 7 lbs 2 x 10 each leg   Prone child's pose for manual    Manual Therapy: - IASTM with percussion device quadruped and prone bilateral paraspinals lumbar  Rt side more painful, sore but well tolerated   Modalities:             Cold pack prone lumbar x 7 min     [name] [name] Short Term Goals - 03/04/21 1044       [name] [name] SHORT TERM GOAL #1   Title [name] [name] will be >75% HEP compliant within 3 weeks to improve carryover between sessions and facilitate independent management of condition.    Status Achieved               [name] [name] Long Term Goals - 03/04/21 1044       [name] [name] LONG TERM GOAL #1   Title [name] [name] will improve FOTO score from 41 to 66 as a proxy for functional improvement    Baseline 55    Period Weeks    Status On-going    Target Date 04/15/21      [name] [name] LONG TERM GOAL #2   Title  [name] [name] will be able to return to work (full duty), not limited by pain  EVAL: unable    Status On-going    Target Date 04/15/21      [name] [name] LONG TERM GOAL #3   Title [name] [name] will be able to resume modified weight lifting, not limited by pain  EVAL: unable    Status On-going    Target Date 04/15/21      [name] [name] LONG TERM GOAL #4   Title [name] [name] will be able to show Independence with stabilization exercises for long term mgmt of symptoms    Time 6    Period Weeks    Status New    Target Date 04/15/21                   Plan - 03/13/21 0254     Clinical Impression Statement [name] [name] is progressing well with therapy.  We were able to progress volume of PPT based exercises with fatigue but no increase in pain.  Will continue to progress core and hip strengthening as tolerated.  [name] [name] reports pain reduction followng manual.  Continue per POC.    Personal Factors and Comorbidities Profession    Examination-Activity Limitations Bend;Lift;Squat;Locomotion Level;Stand;Sit    Examination-Participation Restrictions Community Activity;Occupation    Stability/Clinical Decision Making Stable/Uncomplicated    Rehab Potential Good    [name] [name] Frequency 2x / week    [name] [name] Duration 6 weeks   8 visits in 6 weeks   [name] [name] Treatment/Interventions ADLs/Self Care Home Management;Aquatic Therapy;Moist Heat;Gait training;Therapeutic activities;Therapeutic exercise;Neuromuscular re-education;Manual techniques;Dry needling;Spinal Manipulations    [name] [name] Next Visit Plan core HEP, body mechanics info and hinging. SI belt, gentle lumbar stretching and strengthening, TDN?    [name] [name] Home Exercise Plan [email or web address]    Consulted and Agree with Plan of Care Patient             Patient will benefit from skilled therapeutic intervention in order to improve the following deficits and impairments:  Pain, Decreased activity tolerance, Difficulty walking, Decreased range of motion, Decreased strength, Decreased mobility, Obesity  Visit  Diagnosis: Other abnormalities of gait and mobility  Acute low back  pain without sciatica, unspecified back pain laterality  Acute right-sided low back pain without sciatica     Problem List Patient Active Problem List   Diagnosis Date Noted   Allergic reaction 10/17/2020   Anaphylactic reaction 10/17/2020   HTN (hypertension) 10/17/2020   GERD (gastroesophageal reflux disease) 10/17/2020   Anxiety 10/17/2020   Depression 10/17/2020   Obesity, Class III, BMI 40-49.9 (morbid obesity) (HCC) 10/17/2020   Migraine without aura and without status migrainosus, not intractable 03/31/2019   Endometriosis 08/20/2017   Diverticular disease 02/10/2017    [name] [name] [name] [name], DPT 03/13/21 9:55 AM   [facility] [address] [place], [place], [address] Phone: [phone]   Fax:  [phone]  Name: [name] [name] MRN: [id number] Date of Birth: [birth year]

## 2021-03-14 ENCOUNTER — Other Ambulatory Visit (HOSPITAL_COMMUNITY): Payer: Self-pay

## 2021-03-18 ENCOUNTER — Ambulatory Visit: Payer: PRIVATE HEALTH INSURANCE | Admitting: Physical Therapy

## 2021-03-18 ENCOUNTER — Telehealth: Payer: Self-pay | Admitting: Physical Therapy

## 2021-03-18 NOTE — Telephone Encounter (Signed)
Called and informed patient of missed visit and provided reminder of next appt and attendance policy.  

## 2021-03-20 ENCOUNTER — Other Ambulatory Visit: Payer: Self-pay

## 2021-03-20 ENCOUNTER — Ambulatory Visit: Payer: PRIVATE HEALTH INSURANCE | Admitting: Physical Therapy

## 2021-03-20 DIAGNOSIS — R2689 Other abnormalities of gait and mobility: Secondary | ICD-10-CM

## 2021-03-20 DIAGNOSIS — M545 Low back pain, unspecified: Secondary | ICD-10-CM

## 2021-03-20 NOTE — Therapy (Signed)
[facility]patient Rehabilitation [facility] [address] [place], [place], [address] Phone: [phone]   Fax:  [phone]  Physical Therapy Treatment  Patient Details  Name: [name] [name] MRN: [id number] Date of Birth: [birth year] Referring Provider (PT): Logan Bores, MD   Encounter Date: 03/20/2021   PT End of Session - 03/20/21 0939     Visit Number 10    Number of Visits 15    Date for PT Re-Evaluation 04/15/21    Authorization Type WC (7 visits including eval) FOTO 6th and 10th    PT Start Time 0936    PT Stop Time 1030    PT Time Calculation (min) 54 min    Activity Tolerance Patient tolerated treatment well;Patient limited by pain    Behavior During Therapy [facility] for tasks assessed/performed             Past Medical History:  Diagnosis Date   Anemia    Anxiety    Arthritis    knee   Asthma    Back pain    lower back   Colitis    Complication of anesthesia    Depression    Diverticulitis    Endometriosis 08/20/2017   GERD (gastroesophageal reflux disease)    History of kidney stones    passed stones-no surgery required   Migraine    Nerve damage    left femoral nerve   Neuromuscular disorder (HCC)    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    PONV (postoperative nausea and vomiting)    Seasonal allergies    Seizures (HCC)    febrile seizure as a child, no problems as aduilt   Ulcerative colitis (HCC)     Past Surgical History:  Procedure Laterality Date   BOWEL RESECTION  02/10/2017   BOWEL RESECTION     CHOLECYSTECTOMY     COLONOSCOPY     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   Subjective Assessment - 03/20/21 1125     Subjective Has had a migraine for 3 days.  Missed last appt due to a work court deposition, was unable to call to cancel. The L side hurts now. I want to get back to yoga or lifting.    Currently in Pain? 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 x 10  Quadruped plank x 10 sec x 3 flat back  Standing  RDL 15 lbs x 10  cued for smaller ROM , pt with spasm in Rt lumbar post exercise , "locked" in place  Seated piriformis L hip x 30 sec Spring board press down with roll down bar x 10  Diagonal pull with slastix each UE x 10  Split stance Punch x 5 added , circles x 10 with slastix each LE as stated     Manual therapy  STM to bilateral L> R paraspinals  Rt hip PROM with compression to piriformis and glutes   Cold pack x 10 min     PT Education - 03/20/21 1127     Education Details core, lifting, Barre    Person(s) Educated Patient    Methods Explanation    Comprehension Verbalized understanding              PT Short Term Goals - 03/04/21 1044       PT SHORT TERM GOAL #1   Title [name] will be >75% HEP compliant within 3 weeks to improve carryover between sessions and facilitate independent management of condition.    Status Achieved               PT Long Term Goals - 03/20/21 1127       PT LONG TERM GOAL #1   Title [name] will improve  FOTO score from 41 to 66 as a proxy for functional improvement    Baseline 55    Status On-going      PT LONG TERM GOAL #2   Title [name] will be able to return to work (full duty), not limited by pain  EVAL: unable    Baseline still light duty health at work    Status On-going      PT LONG TERM GOAL #3   Title [name] will be able to resume modified weight lifting, not limited by pain  EVAL: unable    Baseline painful today    Status On-going      PT LONG TERM GOAL #4   Title Pt will be able to show Independence with stabilization exercises for long term mgmt of symptoms    Status On-going                   Plan - 03/20/21 1123     Clinical Impression Statement Patient with relatively low pain today but irritable with loading into dead lift, hip hinging.  Focused on core and offered manual therapy to bilateral lumbar and Rt gluteus medius trigger points, piriformis. Improving slowly.    PT Treatment/Interventions ADLs/Self Care Home Management;Aquatic Therapy;Moist Heat;Gait training;Therapeutic activities;Therapeutic exercise;Neuromuscular re-education;Manual techniques;Dry needling;Spinal Manipulations    PT Next Visit Plan try Reformer (Jpaa) core HEP, body mechanics info and hinging. SI belt, gentle lumbar stretching and strengthening, TDN?    PT Home Exercise Plan [email or web address]    Consulted and Agree with Plan of Care Patient             Patient will benefit from skilled therapeutic intervention in order to improve the following deficits and impairments:  Pain, Decreased activity tolerance, Difficulty walking, Decreased range of motion, Decreased strength, Decreased mobility, Obesity  Visit Diagnosis: Other abnormalities of gait and mobility  Acute low back pain without sciatica, unspecified back pain laterality  Acute right-sided low back pain without sciatica     Problem List Patient Active Problem List   Diagnosis Date Noted   Allergic  reaction 10/17/2020   Anaphylactic reaction  10/17/2020   HTN (hypertension) 10/17/2020   GERD (gastroesophageal reflux disease) 10/17/2020   Anxiety 10/17/2020   Depression 10/17/2020   Obesity, Class III, BMI 40-49.9 (morbid obesity) (HCC) 10/17/2020   Migraine without aura and without status migrainosus, not intractable 03/31/2019   Endometriosis 08/20/2017   Diverticular disease 02/10/2017    Markees Carns, PT 03/20/2021, 11:31 AM  [facility] [address] [place], [place], [address] Phone: [phone]   Fax:  [phone]  Name: [name] [name] MRN: [id number] Date of Birth: 1985/12/11   Karie Mainland, PT 03/20/21 11:31 AM Phone: [phone] Fax: [phone]

## 2021-03-24 ENCOUNTER — Ambulatory Visit: Payer: PRIVATE HEALTH INSURANCE | Admitting: Physical Therapy

## 2021-03-24 ENCOUNTER — Encounter: Payer: Self-pay | Admitting: Physical Therapy

## 2021-03-24 ENCOUNTER — Other Ambulatory Visit: Payer: Self-pay

## 2021-03-24 DIAGNOSIS — M545 Low back pain, unspecified: Secondary | ICD-10-CM

## 2021-03-24 DIAGNOSIS — R2689 Other abnormalities of gait and mobility: Secondary | ICD-10-CM | POA: Diagnosis not present

## 2021-03-24 NOTE — Therapy (Signed)
[facility] Outpatient Rehabilitation [facility] [address] [place], [place], [address] Phone: [phone]   Fax:  [phone]  Physical Therapy Treatment  Patient Details  Name: [name] [name] MRN: [id number] Date of Birth: 01/20/1986 Referring Provider (PT): Logan Bores, MD   Encounter Date: 03/24/2021   PT End of Session - 03/24/21 0952     Visit Number 11    Number of Visits 15    Date for PT Re-Evaluation 04/15/21    Authorization Type WC  10th    Authorization - Visit Number 5    Authorization - Number of Visits 8    PT Start Time [id number]   pt did arrive on time but there was an error when checking in and it didn't go through.   PT Stop Time 1014    PT Time Calculation (min) 32 min    Activity Tolerance Patient tolerated treatment well;Patient limited by pain    Behavior During Therapy [facility] for tasks assessed/performed             Past Medical History:  Diagnosis Date   Anemia    Anxiety    Arthritis    knee   Asthma    Back pain    lower back   Colitis    Complication of anesthesia    Depression    Diverticulitis    Endometriosis 08/20/2017   GERD (gastroesophageal reflux disease)    History of kidney stones    passed stones-no surgery required   Migraine    Nerve damage    left femoral nerve   Neuromuscular disorder (HCC)    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    PONV (postoperative nausea and vomiting)    Seasonal allergies    Seizures (HCC)    febrile seizure as a child, no problems as aduilt   Ulcerative colitis (HCC)     Past Surgical History:  Procedure Laterality Date   BOWEL RESECTION  02/10/2017   BOWEL RESECTION     CHOLECYSTECTOMY     COLONOSCOPY     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   Subjective Assessment - 03/24/21 0946     Subjective " The DN really helped alot. I am alittle more sore today which is about a 3/10 from alot of walking."    Diagnostic tests Isolated lumbar disc degeneration at L5-S1 with annular fissure, but  no discrete disc herniation, spinal stenosis or neural impingement.    Currently in Pain? Yes    Pain Score 3                 [facility] PT Assessment - 03/24/21 0001       Assessment   Medical Diagnosis Strain of muscle, fascia and tendon of lower back, initial encounter (E70.350K)    Referring Provider (PT) Logan Bores, MD  [facility] Adult PT Treatment/Exercise:  Therapeutic Exercise: Nu-step L7 x 5 min UE/LE Low back stretch 2 x 30 sec seated on table reaching for floor Wall squat 1 x 10 Sink squat 1 x 10  Manual Therapy: Skilled palpation and monitoring of pt during TPDN IASTM along bil lumbar paraspinals  Neuromuscular re-ed: N/A  Therapeutic Activity: N/A  Self Care N/A  ITEMS NOT PERFORMED TODAY: RDL 15 lbs x 10  cued for smaller ROM , pt with spasm in Rt lumbar post exercise , "locked" in place RDL 15 lbs x 10  cued for smaller ROM , pt with spasm in Rt lumbar post exercise , "locked" in place Bird dog hold with UE and LE lifting x 10  Quadruped plank x 10 sec x 3 flat back            Trigger Point Dry Needling - 03/24/21 0001     Consent Given? Yes    E-stim with Dry Needling Details CPS 20 LVL, x 8 min increasing tolerance PRN.    Lumbar multifidi Response Twitch response elicited;Palpable increased muscle length   Bil L1 - L5                    PT Short Term  Goals - 03/04/21 1044       PT SHORT TERM GOAL #1   Title [name] [name] will be >75% HEP compliant within 3 weeks to improve carryover between sessions and facilitate independent management of condition.    Status Achieved               PT Long Term Goals - 03/20/21 1127       PT LONG TERM GOAL #1   Title [name] [name] will improve FOTO score from 41 to 66 as a proxy for functional improvement    Baseline 55    Status On-going      PT LONG TERM GOAL #2   Title [name] [name] will be able to return to work (full duty), not limited by pain  EVAL: unable    Baseline still light duty health at work    Status On-going      PT LONG TERM GOAL #3   Title [name] [name] will be able to resume modified weight lifting, not limited by pain  EVAL: unable    Baseline painful today    Status On-going      PT LONG TERM GOAL #4   Title Pt will be able to show Independence with stabilization exercises for long term mgmt of symptoms    Status On-going                   Plan - 03/24/21 1007     Clinical Impression Statement limited session today due to error that occurred when she checked in. Continued TPDN focusing on the low back combined with Estim followed with IASTM technques. continued working on hip strengthening focusing on glute activation and low back stretching.  She did well with session noted relief of pain / tension following session.    PT Treatment/Interventions ADLs/Self Care Home Management;Aquatic Therapy;Moist Heat;Gait training;Therapeutic activities;Therapeutic exercise;Neuromuscular re-education;Manual techniques;Dry needling;Spinal Manipulations    PT Next Visit Plan try Reformer (Jpaa) core HEP, body mechanics info and hinging. SI belt, gentle lumbar stretching and strengthening, response to DN and continue PRN.    PT Home Exercise Plan [facility]             Patient will benefit from skilled therapeutic  intervention in order to improve the following  deficits and impairments:  Pain, Decreased activity tolerance, Difficulty walking, Decreased range of motion, Decreased strength, Decreased mobility, Obesity  Visit Diagnosis: Other abnormalities of gait and mobility  Acute low back pain without sciatica, unspecified back pain laterality  Acute right-sided low back pain without sciatica     Problem List Patient Active Problem List   Diagnosis Date Noted   Allergic reaction 10/17/2020   Anaphylactic reaction 10/17/2020   HTN (hypertension) 10/17/2020   GERD (gastroesophageal reflux disease) 10/17/2020   Anxiety 10/17/2020   Depression 10/17/2020   Obesity, Class III, BMI 40-49.9 (morbid obesity) (HCC) 10/17/2020   Migraine without aura and without status migrainosus, not intractable 03/31/2019   Endometriosis 08/20/2017   Diverticular disease 02/10/2017   Lulu Riding PT, DPT, LAT, ATC  03/24/21  10:20 AM      [facility] [address] [place], [place], [address] Phone: [phone]   Fax:  [phone]  Name: [name] [name] MRN: [id number] Date of Birth: 28-Jul-1985

## 2021-03-25 ENCOUNTER — Ambulatory Visit: Payer: PRIVATE HEALTH INSURANCE | Admitting: Physical Therapy

## 2021-03-27 ENCOUNTER — Encounter: Payer: Self-pay | Admitting: Physical Therapy

## 2021-03-27 ENCOUNTER — Ambulatory Visit: Payer: PRIVATE HEALTH INSURANCE | Admitting: Physical Therapy

## 2021-03-27 ENCOUNTER — Other Ambulatory Visit: Payer: Self-pay

## 2021-03-27 DIAGNOSIS — R2689 Other abnormalities of gait and mobility: Secondary | ICD-10-CM

## 2021-03-27 DIAGNOSIS — M545 Low back pain, unspecified: Secondary | ICD-10-CM

## 2021-03-27 NOTE — Therapy (Signed)
[facility] Outpatient Rehabilitation [facility] [address] [place], [place], [address] Phone: [phone]   Fax:  [phone]  Physical Therapy Treatment  Patient Details  Name: Crucita Lacorte MRN: [id number] Date of Birth: 04-17-1986 Referring Provider (PT): Logan Bores, MD   Encounter Date: 03/27/2021   PT End of Session - 03/27/21 0918     Visit Number 12    Number of Visits 15    Date for PT Re-Evaluation 04/15/21    Authorization Type WC  10th    Authorization - Visit Number 6    Authorization - Number of Visits 8    PT Start Time 0918    PT Stop Time 1006    PT Time Calculation (min) 48 min    Activity Tolerance Patient tolerated treatment well;Patient limited by pain    Behavior During Therapy [facility] for tasks assessed/performed             Past Medical History:  Diagnosis Date   Anemia    Anxiety    Arthritis    knee   Asthma    Back pain    lower back   Colitis    Complication of anesthesia    Depression    Diverticulitis    Endometriosis 08/20/2017   GERD (gastroesophageal reflux disease)    History of kidney stones    passed stones-no surgery required   Migraine    Nerve damage    left femoral nerve   Neuromuscular disorder (HCC)    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    PONV (postoperative nausea and vomiting)    Seasonal allergies    Seizures (HCC)    febrile seizure as a child, no problems as aduilt   Ulcerative colitis (HCC)     Past Surgical History:  Procedure Laterality Date   BOWEL RESECTION  02/10/2017   BOWEL RESECTION     CHOLECYSTECTOMY     COLONOSCOPY     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   Subjective Assessment - 03/27/21 0921     Subjective Pt reports that DN was helpful.  She reports HEP compliance.  She has 2/10 r sided LBP today.  Aggs: standing still  Eases: stretching, heat    Pertinent History L femoral nerve palsy, asthma    Diagnostic tests Isolated lumbar disc degeneration at L5-S1 with annular fissure, but  no discrete disc herniation, spinal stenosis or neural impingement.            Therapeutic Exercise to improve core and hip strength to reduce pain.  Pt cued for form and pacing throughout:  - Nu-step L6, 78m - LTR - 10x - LE X over - 10x ea - Resisted PPT with belt and foam roller 3x10            - bridge with 10x KB press - 3x10 - bird dog - with opposing touches - 10''  hold 2x4 ea - cat camel - 10x - Childs pose - 3x30'' - Deadlift - 5x5 @ 75# - raised bar - RDL to step - 5x ea - Supine SLR (alternating with ankles on foam roller) with overhead lift 7 lbs 2 x 10 each leg (not today)   Modalities:             Cold pack prone lumbar x 7 min     PT Short Term Goals - 03/04/21 1044       PT SHORT TERM GOAL #1   Title Shoshana Johal will be >75% HEP compliant within 3 weeks to improve carryover between sessions and facilitate independent management of condition.    Status Achieved               PT Long Term Goals - 03/20/21 1127       PT LONG TERM GOAL #1   Title Manda Holstad will improve FOTO score from 41 to 66 as a proxy for functional improvement    Baseline 55    Status On-going      PT LONG TERM GOAL #2   Title Lusine Corlett will be able to return to work (full duty), not limited by pain  EVAL: unable    Baseline still light duty health at work    Status On-going      PT LONG TERM GOAL #3   Title  Carmel Garfield will be able to resume modified weight lifting, not limited by pain  EVAL: unable    Baseline painful today    Status On-going      PT LONG TERM GOAL #4   Title Pt will be able to show Independence with stabilization exercises for long term mgmt of symptoms    Status On-going                   Plan - 03/27/21 1000     Clinical Impression Statement Pt reports no increase in baseline pain following therapy  HEP was reviewed, but left unchanged    Overall, Natalya Domzalski is progressing well with therapy.  Today we concentrated on core strengthening, hip strengthening, and functional strengthening.  Pt able to progress to raised D/L today with good form.  Corrected for lumbar hyper ext during terminal hip ext during D/L with good effect.  Pt will continue to benefit from skilled physical therapy to address remaining deficits and achieve listed goals.  Continue per POC.    PT Treatment/Interventions ADLs/Self Care Home Management;Aquatic Therapy;Moist Heat;Gait training;Therapeutic activities;Therapeutic exercise;Neuromuscular re-education;Manual techniques;Dry needling;Spinal Manipulations    PT Next Visit Plan try Reformer (Jpaa) core HEP, body mechanics info and hinging. SI belt, gentle lumbar stretching and strengthening, response to DN and continue PRN.    PT Home Exercise Plan [facility]             Patient will benefit from skilled therapeutic intervention in order to improve the following deficits and impairments:  Pain, Decreased activity tolerance, Difficulty walking, Decreased range of motion, Decreased strength, Decreased mobility, Obesity  Visit Diagnosis: Other abnormalities of gait and mobility  Acute low back pain without sciatica, unspecified back pain laterality  Acute right-sided low back pain without sciatica     Problem List Patient Active Problem List   Diagnosis Date Noted   Allergic reaction 10/17/2020   Anaphylactic reaction  10/17/2020   HTN (hypertension) 10/17/2020   GERD (gastroesophageal reflux disease) 10/17/2020   Anxiety 10/17/2020   Depression 10/17/2020   Obesity,  Class III, BMI 40-49.9 (morbid obesity) (HCC) 10/17/2020   Migraine without aura and without status migrainosus, not intractable 03/31/2019   Endometriosis 08/20/2017   Diverticular disease 02/10/2017    Fredderick Phenix, PT 03/27/2021, 10:06 AM  [facility] [address] [place], [place], [address] Phone: [phone]   Fax:  [phone]  Name: Editha Bridgeforth MRN: [id number] Date of Birth: 01/23/1986

## 2021-04-01 ENCOUNTER — Ambulatory Visit: Payer: PRIVATE HEALTH INSURANCE | Admitting: Physical Therapy

## 2021-04-01 ENCOUNTER — Other Ambulatory Visit: Payer: Self-pay

## 2021-04-01 ENCOUNTER — Encounter: Payer: Self-pay | Admitting: Physical Therapy

## 2021-04-01 DIAGNOSIS — R2689 Other abnormalities of gait and mobility: Secondary | ICD-10-CM

## 2021-04-01 DIAGNOSIS — M545 Low back pain, unspecified: Secondary | ICD-10-CM

## 2021-04-01 NOTE — Therapy (Signed)
[facility] [address] [place], [place], [address] Phone: [phone]   Fax:  [phone]  Physical Therapy Treatment  Patient Details  Name: [name] [name] MRN: [id number] Date of Birth: 1985-10-21 Referring Provider (PT): Logan Bores, MD   Encounter Date: 04/01/2021   PT End of Session - 04/01/21 1545     Visit Number 13    Number of Visits 15    Date for PT Re-Evaluation 04/15/21    Authorization Type WC  10th    Authorization - Visit Number 7    Authorization - Number of Visits 8    PT Start Time 1545    PT Stop Time 1629    PT Time Calculation (min) 44 min    Activity Tolerance Patient tolerated treatment well    Behavior During Therapy [facility] for tasks assessed/performed             Past Medical History:  Diagnosis Date   Anemia    Anxiety    Arthritis    knee   Asthma    Back pain    lower back   Colitis    Complication of anesthesia    Depression    Diverticulitis    Endometriosis 08/20/2017   GERD (gastroesophageal reflux disease)    History of kidney stones    passed stones-no surgery required   Migraine    Nerve damage    left femoral nerve   Neuromuscular disorder (HCC)    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    PONV (postoperative nausea and vomiting)    Seasonal allergies    Seizures (HCC)    febrile seizure as a child, no problems as aduilt   Ulcerative colitis (HCC)     Past Surgical History:  Procedure Laterality Date   BOWEL RESECTION  02/10/2017   BOWEL RESECTION     CHOLECYSTECTOMY     COLONOSCOPY     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   Subjective Assessment - 04/01/21 1547     Subjective " I've been pretty good, some soreness today. Just alittle achey and sore mostly in the central back."    Diagnostic tests Isolated lumbar disc degeneration at L5-S1 with annular fissure, but  no discrete disc herniation, spinal stenosis or neural impingement.    Pain Onset More than a month ago    Pain Frequency Intermittent    Aggravating Factors  standing in place    Pain Relieving Factors stretching, heat, meds                [facility] PT Assessment - 04/01/21 0001       Assessment   Medical Diagnosis Strain of muscle, fascia and tendon of lower back, initial encounter (Y59.093J)    Referring Provider (PT) Logan Bores, MD  [facility] Adult PT Treatment/Exercise:  Therapeutic Exercise: Childs pose stretch 2 x 30  cat camel - 10x holding ea pos x 3 sec Elliptical L5 x 5 min Ramp L5 Leg press 2 x 12 , 1 x 55#, 1 x 75# Tall plank 5 x  10 sec Modified side plank 5 x 10 sec bil Modified pistol squat to table 1 x 10 bil   Manual Therapy: Skilled palpation and monitoring of pt during TPDN IASTM along bil lumbar paraspinals  Neuromuscular re-ed: N/A  Therapeutic Activity: N/A  Self Care: N/A  ITEMS NOT PERFORMED TODAY: - RDL to step - 5x ea - Supine SLR (alternating with ankles on foam roller) with overhead lift 7 lbs 2 x 10 each leg (not today) - bird dog - with opposing touches - 10'' hold 2x4 ea - bridge with 10x KB press - 3x10       Trigger Point Dry Needling - 04/01/21 0001     Consent Given? Yes    Education Handout Provided Yes    E-stim with Dry Needling Details CPS 20 LVL, x 8 min increasing tolerance PRN.    Lumbar multifidi Response Twitch response elicited;Palpable increased  muscle length    Quadratus Lumborum Response Twitch response elicited;Palpable increased muscle length                     PT Short Term Goals - 03/04/21 1044       PT SHORT TERM GOAL #1   Title [name] [name] will be >75% HEP compliant within 3 weeks to improve carryover between sessions and facilitate independent management of condition.    Status Achieved               PT Long Term Goals - 03/20/21 1127       PT LONG TERM GOAL #1   Title [name] [name] will improve FOTO score from 41 to 66 as a proxy for functional improvement    Baseline 55    Status On-going      PT LONG TERM GOAL #2   Title [name] [name] will be able to return to work (full duty), not limited by pain  EVAL: unable    Baseline still light duty health at work    Status On-going      PT LONG TERM GOAL #3   Title [name] [name] will be able to resume modified weight lifting, not limited by pain  EVAL: unable    Baseline painful today    Status On-going      PT LONG TERM GOAL #4   Title Pt will be able to show Independence with stabilization exercises for long term mgmt of symptoms    Status On-going                   Plan - 04/01/21 1559     Clinical Impression Statement pt reports mild soreness in the back which may be related to delayed onset muscle soreness from previous session. continued TPDN for bil lumbar multifidi combined with Estim followed with IASTM techniques. continued working on hip/ core strength which she continues to do well with. monitored pain during / following treatment and she noted some soreness inthe back which is likely from DN.    PT Treatment/Interventions ADLs/Self Care Home Management;Aquatic Therapy;Moist Heat;Gait training;Therapeutic activities;Therapeutic exercise;Neuromuscular re-education;Manual techniques;Dry needling;Spinal Manipulations    PT Next Visit Plan how was MD's visit on 9/22, only has 1 more covered WC visit D/C vs Continue  with PT.  try Reformer ([employer]pa[employer]) core HEP, body mechanics info and hinging. SI belt, gentle lumbar stretching and strengthening, response to DN and continue PRN.    PT Home Exercise Plan [id number]    Consulted and Agree with Plan of Care Patient             Patient will benefit from skilled therapeutic intervention in order to improve the following deficits and impairments:  Pain, Decreased activity tolerance, Difficulty walking, Decreased range of motion, Decreased strength, Decreased mobility, Obesity  Visit Diagnosis: Other abnormalities of gait and mobility  Acute low back pain without sciatica, unspecified back pain laterality  Acute right-sided low back pain without sciatica     Problem List Patient Active Problem List   Diagnosis Date Noted   Allergic reaction 10/17/2020   Anaphylactic reaction 10/17/2020   HTN (hypertension) 10/17/2020   GERD (gastroesophageal reflux disease) 10/17/2020   Anxiety 10/17/2020   Depression 10/17/2020   Obesity, Class III, BMI 40-49.9 (morbid obesity) (HCC) 10/17/2020   Migraine without aura and without status migrainosus, not intractable 03/31/2019   Endometriosis 08/20/2017   Diverticular disease 02/10/2017   Lulu Riding PT, DPT, LAT, ATC  04/01/21  4:32 PM      [facility] [address] [place], [place], [address] Phone: [phone]   Fax:  [phone]  Name: [name] [name] MRN: [id number] Date of Birth: Nov 27, 1985

## 2021-04-08 ENCOUNTER — Encounter: Payer: Self-pay | Admitting: Physical Therapy

## 2021-04-08 ENCOUNTER — Other Ambulatory Visit: Payer: Self-pay

## 2021-04-08 ENCOUNTER — Ambulatory Visit: Payer: PRIVATE HEALTH INSURANCE | Admitting: Physical Therapy

## 2021-04-08 DIAGNOSIS — M545 Low back pain, unspecified: Secondary | ICD-10-CM

## 2021-04-08 DIAGNOSIS — R2689 Other abnormalities of gait and mobility: Secondary | ICD-10-CM | POA: Diagnosis not present

## 2021-04-08 NOTE — Therapy (Addendum)
[facility] [address] [place], [place], [address] Phone: [phone]   Fax:  [phone]  PHYSICAL THERAPY UNPLANNED DISCHARGE SUMMARY   Visits from Start of Care: 14  Current functional level related to goals / functional outcomes: Current status unknown   Remaining deficits: Current status unknown   Education / Equipment: [name] [name] has not returned since visit listed below  [name] [name] goals were not assessed. [name] [name] is being discharged due to not returning since the last visit.  (the below note was addended to include the above D/C summary on 04/24/21)   Physical Therapy Treatment  [name] [name] Details  Name: [name] [name] MRN: [id number] Date of Birth: 01-20-1986 Referring Provider ([name] [name]): Logan Bores, MD   Encounter Date: 04/08/2021   [name] [name] End of Session - 04/08/21 1128     Visit Number 14    Number of Visits 15    Date for [name] [name] Re-Evaluation 04/15/21    Authorization Type WC  10th    Authorization - Visit Number 8    Authorization - Number of Visits 8    [name] [name] Start Time 1130    [name] [name] Stop Time 1200    [name] [name] Time Calculation (min) 30 min    Activity Tolerance [name] [name] tolerated treatment well    Behavior During Therapy [facility] for tasks assessed/performed             Past Medical History:  Diagnosis Date   Anemia    Anxiety    Arthritis    knee   Asthma    Back pain    lower back   Colitis    Complication of anesthesia    Depression    Diverticulitis    Endometriosis 08/20/2017   GERD (gastroesophageal reflux disease)    History of kidney stones    passed stones-no surgery required   Migraine    Nerve damage    left femoral nerve   Neuromuscular disorder (HCC)    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    PONV (postoperative nausea and vomiting)    Seasonal allergies    Seizures (HCC)    febrile seizure as a child, no problems as aduilt   Ulcerative colitis (HCC)     Past Surgical History:  Procedure Laterality Date   BOWEL  RESECTION  02/10/2017   BOWEL RESECTION     CHOLECYSTECTOMY     COLONOSCOPY     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   Subjective Assessment - 04/08/21 1129     Subjective [name] [name] reports that she saw her MD who reccomended she continue [name] [name].  She feels like she has made good progress so far and would like to see if she can manage her sxs at home for a few weeks.  She has 0/10 R sided LBP today.  Aggs: standing still  Eases: stretching, heat    Pertinent History L femoral nerve palsy, asthma  Diagnostic tests Isolated lumbar disc degeneration at L5-S1 with annular fissure, but  no discrete disc herniation, spinal stenosis or neural impingement.                [name] [name] [name] [name] Assessment - 04/08/21 0001       Observation/Other Assessments   Focus on Therapeutic Outcomes (FOTO)  74             Therapeutic Exercise to improve core and hip strength to reduce pain.  [name] [name] cued for form and pacing throughout:   - Nu-step L6, 41m - LTR - 10x - LE X over - 10x ea - Resisted PPT with belt and foam roller 3x10            - bridge with 10# KB press - 3x10 - bird dog - with opposing touches - 10'' hold x4 ea - Childs pose - 3x30'' - Deadlift - 5x5 @ 75# - raised bar - RDL to step - 5x ea  Therapeutic Activity - collecting information for goals, checking progress, and reviewing with [name] [name]    [name] [name] Short Term Goals - 03/04/21 1044       [name] [name] SHORT TERM  GOAL #1   Title [name] [name] will be >75% HEP compliant within 3 weeks to improve carryover between sessions and facilitate independent management of condition.    Status Achieved               [name] [name] Long Term Goals - 04/08/21 1136       [name] [name] LONG TERM GOAL #1   Title [name] [name] will improve FOTO score from 41 to 66 as a proxy for functional improvement    Baseline 55 9/27: 74    Status Achieved      [name] [name] LONG TERM GOAL #2   Title [name] [name] will be able to return to work (full duty), not limited by pain  EVAL: unable    Baseline still light duty health at work 9/27: feels like she can return, but is still restricted by MD at this point (50 lbs lifting)    Status On-going      [name] [name] LONG TERM GOAL #3   Title [name] [name] will be able to resume modified weight lifting, not limited by pain  EVAL: unable    Baseline painful today 9/27: MET    Status Achieved      [name] [name] LONG TERM GOAL #4   Title [name] [name] will be able to show Independence with stabilization exercises for long term mgmt of symptoms    Baseline 9/27: MET    Status Achieved                   Plan - 04/08/21 1150     Clinical Impression Statement [name] [name] has progressed well with therapy.  Improved impairments include: core strength, lumbar ROM.  Functional improvements include: transfers, lifting .  Progressions needed include: continued stretching and core strengthening at home with HEP, gradual return to normal activity.  Barriers to progress include: none.  Please see baseline and/or status section in "Goals" for specific progress on short term and long term goals established at evaluation.  We will hold [name] [name] for 2 weeks and let [name] [name] self-manage her sxs.  If she feels she needs to return, she can do so.    [name] [name] Treatment/Interventions ADLs/Self Care Home Management;Aquatic Therapy;Moist Heat;Gait training;Therapeutic activities;Therapeutic exercise;Neuromuscular re-education;Manual techniques;Dry  needling;Spinal Manipulations    [name] [name] Next Visit Plan how was MD's visit on 9/22, only  has 1 more covered WC visit D/C vs Continue with [name] [name]. try Reformer (Jpaa) core HEP, body mechanics info and hinging. SI belt, gentle lumbar stretching and strengthening, response to DN and continue PRN.    [name] [name] Home Exercise Plan [id number]    Consulted and Agree with Plan of Care [name] [name]             [name] [name] will benefit from skilled therapeutic intervention in order to improve the following deficits and impairments:  Pain, Decreased activity tolerance, Difficulty walking, Decreased range of motion, Decreased strength, Decreased mobility, Obesity  Visit Diagnosis: Other abnormalities of gait and mobility  Acute low back pain without sciatica, unspecified back pain laterality  Acute right-sided low back pain without sciatica     Problem List [name] [name] Active Problem List   Diagnosis Date Noted   Allergic reaction 10/17/2020   Anaphylactic reaction 10/17/2020   HTN (hypertension) 10/17/2020   GERD (gastroesophageal reflux disease) 10/17/2020   Anxiety 10/17/2020   Depression 10/17/2020   Obesity, Class III, BMI 40-49.9 (morbid obesity) ([place]) 10/17/2020   Migraine without aura and without status migrainosus, not intractable 03/31/2019   Endometriosis 08/20/2017   Diverticular disease 02/10/2017    Mathis Dad, [name] [name] 04/08/2021, 12:13 PM  [facility] [address] [place], [place], [address] Phone: [phone]   Fax:  [phone]  Name: Niza Soderholm MRN: [id number] Date of Birth: 13-Jul-1986

## 2021-04-09 DIAGNOSIS — G43839 Menstrual migraine, intractable, without status migrainosus: Secondary | ICD-10-CM | POA: Diagnosis not present

## 2021-04-09 DIAGNOSIS — G43719 Chronic migraine without aura, intractable, without status migrainosus: Secondary | ICD-10-CM | POA: Diagnosis not present

## 2021-04-09 DIAGNOSIS — M542 Cervicalgia: Secondary | ICD-10-CM | POA: Diagnosis not present

## 2021-04-09 DIAGNOSIS — M791 Myalgia, unspecified site: Secondary | ICD-10-CM | POA: Diagnosis not present

## 2021-04-15 ENCOUNTER — Other Ambulatory Visit (HOSPITAL_COMMUNITY): Payer: Self-pay

## 2021-05-02 ENCOUNTER — Other Ambulatory Visit (HOSPITAL_COMMUNITY): Payer: Self-pay

## 2021-05-02 DIAGNOSIS — J3081 Allergic rhinitis due to animal (cat) (dog) hair and dander: Secondary | ICD-10-CM | POA: Diagnosis not present

## 2021-05-02 DIAGNOSIS — R21 Rash and other nonspecific skin eruption: Secondary | ICD-10-CM | POA: Diagnosis not present

## 2021-05-02 DIAGNOSIS — J300 Vasomotor rhinitis: Secondary | ICD-10-CM | POA: Diagnosis not present

## 2021-05-02 DIAGNOSIS — J4530 Mild persistent asthma, uncomplicated: Secondary | ICD-10-CM | POA: Diagnosis not present

## 2021-05-02 MED ORDER — FLUTICASONE FUROATE-VILANTEROL 200-25 MCG/ACT IN AEPB
INHALATION_SPRAY | RESPIRATORY_TRACT | 3 refills | Status: DC
  Filled 2021-05-02 – 2021-05-15 (×2): qty 60, 30d supply, fill #0

## 2021-05-12 ENCOUNTER — Other Ambulatory Visit (HOSPITAL_COMMUNITY): Payer: Self-pay

## 2021-05-13 ENCOUNTER — Other Ambulatory Visit (HOSPITAL_COMMUNITY): Payer: Self-pay

## 2021-05-13 MED ORDER — BUTORPHANOL TARTRATE 10 MG/ML NA SOLN
NASAL | 0 refills | Status: DC
  Filled 2021-05-13: qty 2.5, 30d supply, fill #0
  Filled 2021-05-15: qty 2.5, 28d supply, fill #0

## 2021-05-15 ENCOUNTER — Other Ambulatory Visit (HOSPITAL_COMMUNITY): Payer: Self-pay

## 2021-05-16 ENCOUNTER — Other Ambulatory Visit (HOSPITAL_COMMUNITY): Payer: Self-pay

## 2021-05-16 MED ORDER — LEVOCETIRIZINE DIHYDROCHLORIDE 5 MG PO TABS
5.0000 mg | ORAL_TABLET | Freq: Every evening | ORAL | 3 refills | Status: AC
  Filled 2021-05-16: qty 30, 30d supply, fill #0
  Filled 2021-06-13: qty 30, 30d supply, fill #1

## 2021-05-19 ENCOUNTER — Other Ambulatory Visit (HOSPITAL_COMMUNITY): Payer: Self-pay

## 2021-05-20 ENCOUNTER — Other Ambulatory Visit (HOSPITAL_COMMUNITY): Payer: Self-pay

## 2021-06-07 ENCOUNTER — Telehealth: Payer: 59 | Admitting: Nurse Practitioner

## 2021-06-07 DIAGNOSIS — M5416 Radiculopathy, lumbar region: Secondary | ICD-10-CM

## 2021-06-07 MED ORDER — PREDNISONE 20 MG PO TABS: 20.0000 mg | ORAL_TABLET | Freq: Every day | ORAL | 0 refills | Status: AC

## 2021-06-07 MED ORDER — BACLOFEN 10 MG PO TABS: 10.0000 mg | ORAL_TABLET | Freq: Three times a day (TID) | ORAL | 0 refills | Status: AC

## 2021-06-07 NOTE — Progress Notes (Signed)
We are sorry that you are not feeling well.  Here is how we plan to help!  Based on what you have shared with me it looks like you mostly have acute back pain.  Acute back pain is defined as musculoskeletal pain that can resolve in 1-3 weeks with conservative treatment.  I have prescribed prednisone as well as Baclofen 10 mg every eight hours as needed which is a muscle relaxer . UNFORTUNATELY WE DO NOT PRESCRIBE CONTROLLED SUBSTANCES/PAIN MEDICATIONS FOR VIRTUAL OR E VISITS JUST FYI.  Some patients experience stomach irritation or in increased heartburn with anti-inflammatory drugs.  Please keep in mind that muscle relaxer's can cause fatigue and should not be taken while at work or driving.  Back pain is very common.  The pain often gets better over time.  The cause of back pain is usually not dangerous.  Most people can learn to manage their back pain on their own.  Home Care Stay active.  Start with short walks on flat ground if you can.  Try to walk farther each day. Do not sit, drive or stand in one place for more than 30 minutes.  Do not stay in bed. Do not avoid exercise or work.  Activity can help your back heal faster. Be careful when you bend or lift an object.  Bend at your knees, keep the object close to you, and do not twist. Sleep on a firm mattress.  Lie on your side, and bend your knees.  If you lie on your back, put a pillow under your knees. Only take medicines as told by your doctor. Put ice on the injured area. Put ice in a plastic bag Place a towel between your skin and the bag Leave the ice on for 15-20 minutes, 3-4 times a day for the first 2-3 days. 210 After that, you can switch between ice and heat packs. Ask your doctor about back exercises or massage. Avoid feeling anxious or stressed.  Find good ways to deal with stress, such as exercise.  Get Help Right Way If: Your pain does not go away with rest or medicine. Your pain does not go away in 1 week. You have  new problems. You do not feel well. The pain spreads into your legs. You cannot control when you poop (bowel movement) or pee (urinate) You feel sick to your stomach (nauseous) or throw up (vomit) You have belly (abdominal) pain. You feel like you may pass out (faint). If you develop a fever.  Make Sure you: Understand these instructions. Will watch your condition Will get help right away if you are not doing well or get worse.  Your e-visit answers were reviewed by a board certified advanced clinical practitioner to complete your personal care plan.  Depending on the condition, your plan could have included both over the counter or prescription medications.  If there is a problem please reply  once you have received a response from your provider.  Your safety is important to [place].  If you have drug allergies check your prescription carefully.    You can use MyChart to ask questions about today's visit, request a non-urgent call back, or ask for a work or school excuse for 24 hours related to this e-Visit. If it has been greater than 24 hours you will need to follow up with your provider, or enter a new e-Visit to address those concerns.  You will get an e-mail in the next two days asking about your experience.  I hope that your e-visit has been valuable and will speed your recovery. Thank you for using e-visits.

## 2021-06-07 NOTE — Progress Notes (Signed)
I have spent 5 minutes in review of e-visit questionnaire, review and updating patient chart, medical decision making and response to patient.  ° °[name] Stickle W Milanna Kozlov, NP ° °  °

## 2021-06-13 ENCOUNTER — Other Ambulatory Visit (HOSPITAL_COMMUNITY): Payer: Self-pay

## 2021-06-13 MED ORDER — PANTOPRAZOLE SODIUM 20 MG PO TBEC
DELAYED_RELEASE_TABLET | ORAL | 0 refills | Status: DC
  Filled 2021-06-13: qty 90, 90d supply, fill #0

## 2021-06-13 MED ORDER — HYDROXYZINE HCL 10 MG PO TABS
ORAL_TABLET | ORAL | 3 refills | Status: DC
  Filled 2021-06-13: qty 90, 30d supply, fill #0

## 2021-06-13 MED ORDER — BUTORPHANOL TARTRATE 10 MG/ML NA SOLN
NASAL | 0 refills | Status: DC
  Filled 2021-06-13: qty 2.5, 30d supply, fill #0

## 2021-06-13 MED ORDER — BUTORPHANOL TARTRATE 1 MG/ML IJ SOLN
INTRAMUSCULAR | 0 refills | Status: DC
  Filled 2021-07-15: qty 1, fill #0

## 2021-06-16 ENCOUNTER — Other Ambulatory Visit (HOSPITAL_COMMUNITY): Payer: Self-pay

## 2021-07-11 ENCOUNTER — Other Ambulatory Visit (HOSPITAL_COMMUNITY): Payer: Self-pay

## 2021-07-11 DIAGNOSIS — Z23 Encounter for immunization: Secondary | ICD-10-CM | POA: Diagnosis not present

## 2021-07-11 DIAGNOSIS — M5442 Lumbago with sciatica, left side: Secondary | ICD-10-CM | POA: Diagnosis not present

## 2021-07-11 DIAGNOSIS — I10 Essential (primary) hypertension: Secondary | ICD-10-CM | POA: Diagnosis not present

## 2021-07-15 ENCOUNTER — Other Ambulatory Visit (HOSPITAL_COMMUNITY): Payer: Self-pay

## 2021-07-16 ENCOUNTER — Other Ambulatory Visit (HOSPITAL_COMMUNITY): Payer: Self-pay

## 2021-07-16 MED ORDER — BUTORPHANOL TARTRATE 10 MG/ML NA SOLN
NASAL | 0 refills | Status: DC
  Filled 2021-07-16: qty 2.5, 30d supply, fill #0

## 2021-07-17 ENCOUNTER — Other Ambulatory Visit (HOSPITAL_COMMUNITY): Payer: Self-pay

## 2021-08-16 ENCOUNTER — Other Ambulatory Visit (HOSPITAL_COMMUNITY): Payer: Self-pay

## 2021-08-16 MED ORDER — HYDROCOD POLI-CHLORPHE POLI ER 10-8 MG/5ML PO SUER
5.0000 mL | Freq: Two times a day (BID) | ORAL | 0 refills | Status: DC
  Filled 2021-08-16: qty 50, 5d supply, fill #0

## 2021-10-13 ENCOUNTER — Other Ambulatory Visit: Payer: Self-pay

## 2021-10-13 ENCOUNTER — Emergency Department (HOSPITAL_COMMUNITY): Payer: BC Managed Care – PPO

## 2021-10-13 ENCOUNTER — Emergency Department (HOSPITAL_COMMUNITY)
Admission: EM | Admit: 2021-10-13 | Discharge: 2021-10-13 | Disposition: A | Discharge status: Home / Self Care | Payer: BC Managed Care – PPO | Attending: Student | Admitting: Student

## 2021-10-13 DIAGNOSIS — R1032 Left lower quadrant pain: Secondary | ICD-10-CM | POA: Insufficient documentation

## 2021-10-13 DIAGNOSIS — Z79899 Other long term (current) drug therapy: Secondary | ICD-10-CM | POA: Insufficient documentation

## 2021-10-13 DIAGNOSIS — R102 Pelvic and perineal pain: Secondary | ICD-10-CM | POA: Insufficient documentation

## 2021-10-13 DIAGNOSIS — N83209 Unspecified ovarian cyst, unspecified side: Secondary | ICD-10-CM

## 2021-10-13 LAB — COMPREHENSIVE METABOLIC PANEL
ALT: 25 U/L (ref 0–44)
AST: 18 U/L (ref 15–41)
Albumin: 3.8 g/dL (ref 3.5–5.0)
Alkaline Phosphatase: 55 U/L (ref 38–126)
Anion gap: 9 (ref 5–15)
BUN: 22 mg/dL — ABNORMAL HIGH (ref 6–20)
CO2: 25 mmol/L (ref 22–32)
Calcium: 9.3 mg/dL (ref 8.9–10.3)
Chloride: 105 mmol/L (ref 98–111)
Creatinine, Ser: 0.96 mg/dL (ref 0.44–1.00)
GFR, Estimated: >60 mL/min (ref >60)
Glucose, Bld: 105 mg/dL — ABNORMAL HIGH (ref 70–99)
Potassium: 4.2 mmol/L (ref 3.5–5.1)
Sodium: 139 mmol/L (ref 135–145)
Total Bilirubin: 0.4 mg/dL (ref 0.3–1.2)
Total Protein: 6.5 g/dL (ref 6.5–8.1)

## 2021-10-13 LAB — URINALYSIS, ROUTINE W REFLEX MICROSCOPIC
Bilirubin Urine: NEGATIVE
Glucose, UA: NEGATIVE mg/dL
Hgb urine dipstick: NEGATIVE
Ketones, ur: NEGATIVE mg/dL
Leukocytes,Ua: NEGATIVE
Nitrite: NEGATIVE
Protein, ur: NEGATIVE mg/dL
Specific Gravity, Urine: 1.023 (ref 1.005–1.030)
pH: 5 (ref 5.0–8.0)

## 2021-10-13 LAB — CBC
HCT: 38.4 % (ref 36.0–46.0)
Hemoglobin: 12.9 g/dL (ref 12.0–15.0)
MCH: 28.9 pg (ref 26.0–34.0)
MCHC: 33.6 g/dL (ref 30.0–36.0)
MCV: 85.9 fL (ref 80.0–100.0)
Platelets: 341 10*3/uL (ref 150–400)
RBC: 4.47 MIL/uL (ref 3.87–5.11)
RDW: 13.3 % (ref 11.5–15.5)
WBC: 19.4 10*3/uL — ABNORMAL HIGH (ref 4.0–10.5)
nRBC: 0 % (ref 0.0–0.2)

## 2021-10-13 LAB — PROTIME-INR
INR: 1 (ref 0.8–1.2)
Prothrombin Time: 12.8 seconds (ref 11.4–15.2)

## 2021-10-13 LAB — I-STAT BETA HCG BLOOD, ED (MC, WL, AP ONLY): I-stat hCG, quantitative: <5 m[IU]/mL (ref <5)

## 2021-10-13 LAB — LIPASE, BLOOD: Lipase: 29 U/L (ref 11–51)

## 2021-10-13 LAB — PREGNANCY, URINE: Preg Test, Ur: NEGATIVE

## 2021-10-13 LAB — APTT: aPTT: 25 seconds (ref 24–36)

## 2021-10-13 MED ORDER — FENTANYL CITRATE PF 50 MCG/ML IJ SOSY
50.0000 ug | PREFILLED_SYRINGE | Freq: Once | INTRAMUSCULAR | Status: AC
  Administered 2021-10-13: 50 ug via INTRAMUSCULAR
  Filled 2021-10-13: qty 1

## 2021-10-13 MED ORDER — METOCLOPRAMIDE HCL 10 MG PO TABS
10.0000 mg | ORAL_TABLET | Freq: Once | ORAL | Status: AC
  Administered 2021-10-13: 10 mg via ORAL
  Filled 2021-10-13 (×2): qty 1

## 2021-10-13 MED ORDER — MORPHINE SULFATE (PF) 4 MG/ML IV SOLN
4.0000 mg | Freq: Once | INTRAVENOUS | Status: AC
  Administered 2021-10-13: 4 mg via INTRAVENOUS
  Filled 2021-10-13: qty 1

## 2021-10-13 MED ORDER — KETOROLAC TROMETHAMINE 15 MG/ML IJ SOLN
15.0000 mg | Freq: Once | INTRAMUSCULAR | Status: AC
  Administered 2021-10-13: 15 mg via INTRAVENOUS
  Filled 2021-10-13: qty 1

## 2021-10-13 MED ORDER — FENTANYL CITRATE PF 50 MCG/ML IJ SOSY: 50.0000 ug | PREFILLED_SYRINGE | Freq: Once | INTRAMUSCULAR | Status: DC

## 2021-10-13 MED ORDER — IOHEXOL 300 MG/ML  SOLN
100.0000 mL | Freq: Once | INTRAMUSCULAR | Status: AC | PRN
  Administered 2021-10-13: 100 mL via INTRAVENOUS

## 2021-10-13 MED ORDER — NAPROXEN 500 MG PO TABS
500.0000 mg | ORAL_TABLET | Freq: Two times a day (BID) | ORAL | 0 refills | Status: DC
  Filled 2021-10-13 – 2021-10-14 (×2): qty 30, 15d supply, fill #0

## 2021-10-13 MED ORDER — ONDANSETRON HCL 4 MG/2ML IJ SOLN
4.0000 mg | INTRAMUSCULAR | Status: AC
  Administered 2021-10-13: 4 mg via INTRAVENOUS

## 2021-10-13 MED ORDER — OXYCODONE HCL 5 MG PO TABS
5.0000 mg | ORAL_TABLET | ORAL | 0 refills | Status: DC | PRN
  Filled 2021-10-13: qty 14, 3d supply, fill #0

## 2021-10-13 MED ORDER — ONDANSETRON HCL 4 MG/2ML IJ SOLN
4.0000 mg | INTRAMUSCULAR | Status: DC | PRN
  Administered 2021-10-13: 4 mg via INTRAVENOUS
  Filled 2021-10-13 (×2): qty 2

## 2021-10-13 MED ORDER — SODIUM CHLORIDE 0.9 % IV SOLN
8.0000 mg | Freq: Once | INTRAVENOUS | Status: DC
  Filled 2021-10-13: qty 4

## 2021-10-13 NOTE — ED Triage Notes (Signed)
Pt sent here by [facility] for generalized abd pain w/ N/V and loose stools x 1.5 weeks. Pt reports constant diffuse tenderness w/ intermittent sharp stabbing pains, pt noticed bruising on abd last night. Hx diverticulitis, IBS, a nd bowel perforation. ?

## 2021-10-13 NOTE — ED Notes (Signed)
Patient transported to CT 

## 2021-10-13 NOTE — ED Provider Notes (Signed)
?[facility] EMERGENCY DEPARTMENT ?Provider Note ? ? ?CSN: [id number] ?Arrival date & time: 10/13/21  1555 ? ?  ? ?History ? ?Chief Complaint  ?Patient presents with  ? Abdominal Pain  ? ? ?[name] [name] is a 36 y.o. female with history of cholecystectomy status post partial colectomy in 2018 and history of ovarian torsion in 2019 who presents to the ED for evaluation of left lower quadrant pain that has been ongoing for 1.5 weeks and has significantly worsened in the last day.  Patient states she has not had any problems or flareups from her UC since her surgery in 2018, however when she developed symptoms, she completed a 5-day course of prednisone 50 mg without symptomatic improvement.  Patient is quite nauseous and vomiting frequently, although she has temporary relief with Zofran p.o.  She denies chest pain, shortness of breath, fevers, vaginal bleeding and discharge.  She has no urinary symptoms.  She does have diarrhea, although this is baseline for her. ? ? ?Abdominal Pain ? ?  ? ?Home Medications ?Prior to Admission medications   ?Medication Sig Start Date End Date Taking? Authorizing Provider  ?acetaminophen (TYLENOL) 500 MG tablet Take 1,000 mg by mouth every 6 (six) hours as needed for headache.    [provider]  ?albuterol (PROVENTIL) (5 MG/ML) 0.5% nebulizer solution Inhale 1/2 vial (2.5 mg total) via nebulizer every 6 (six) hours as needed for wheezing or shortness of breath. 11/02/20   Rancour, Jeannett Senior, MD  ?azelastine (ASTELIN) 0.1 % nasal spray Spray 1-2 puffs in each nostril twice a day 11/29/20     ?benzonatate (TESSALON) 100 MG capsule Take 1-2 capsules (100-200 mg total) by mouth 3 (three) times daily as needed for cough. ?Patient not taking: Reported on 01/03/2021 10/09/20   Lurene Shadow, PA-C  ?butorphanol (STADOL) 10 MG/ML nasal spray Use 1 spray in one nostril as needed every 6 hours for 30 days 07/16/21     ?cetirizine (ZYRTEC) 10 MG tablet Take 10 mg by mouth  daily. ?Patient not taking: Reported on 01/03/2021    [provider]  ?chlorpheniramine-HYDROcodone (TUSSIONEX PENNKINETIC ER) 10-8 MG/5ML SUER Take 5 mLs by mouth every 12 (twelve) hours as needed for cough. ?Patient not taking: Reported on 01/03/2021 10/19/20   Jerald Kief, MD  ?chlorpheniramine-HYDROcodone (TUSSIONEX) 10-8 MG/5ML SUER Take 5 mls by mouth every 12 hours for 14 days ?Patient not taking: Reported on 01/03/2021 10/22/20     ?chlorpheniramine-HYDROcodone 10-8 MG/5ML Take 5 mLs by mouth every 12 (twelve) hours. 08/16/21     ?chlorproMAZINE (THORAZINE) 25 MG tablet Take 1 tablet (25 mg total) by mouth as needed (take one tablet as needed for Headache). ?Patient not taking: Reported on 01/03/2021 08/14/19   Glyn Ade, Scot Jun, PA-C  ?clonazePAM (KLONOPIN) 0.5 MG tablet Take 0.25 mg by mouth at bedtime as needed for anxiety.    [provider]  ?cyclobenzaprine (FLEXERIL) 10 MG tablet Take 1 tablet (10 mg total) by mouth 3 (three) times daily as needed for muscle spasms. 03/31/19   Glyn Ade, Scot Jun, PA-C  ?diphenhydrAMINE (BENADRYL) 50 MG tablet Take 50 mg by mouth daily as needed for itching.    [provider]  ?EPINEPHrine 0.3 mg/0.3 mL IJ SOAJ injection Inject 0.3 mg into the muscle as needed for anaphylaxis. ?Patient taking differently: Inject 0.3 mg into the muscle once as needed for anaphylaxis. 10/15/20   Gilda Crease, MD  ?escitalopram (LEXAPRO) 20 MG tablet Take 20 mg by  mouth daily.    [provider]  ?famotidine (PEPCID) 20 MG tablet Take 1 tablet (20 mg total) by mouth 2 (two) times daily for 7 days. 10/19/20 10/26/20  Jerald Kief, MD  ?fluticasone Aleda Grana) 50 MCG/ACT nasal spray Place 2 sprays into both nostrils daily. 09/23/17   Waldon Merl, PA-C  ?fluticasone furoate-vilanterol (BREO ELLIPTA) 200-25 MCG/ACT AEPB Inhale into the lungs once daily as instructed. 05/02/21     ?fluticasone-salmeterol (ADVAIR HFA) 115-21 MCG/ACT inhaler Inhale  2 puffs by mouth into the lungs twice a day 10/23/20     ?hydrOXYzine (ATARAX) 10 MG tablet Take 1 tablet (10 mg total) by mouth every 8 (eight) hours as needed. 06/13/21     ?ibuprofen (ADVIL) 200 MG tablet Take 800 mg by mouth every 6 (six) hours as needed for headache.    [provider]  ?ipratropium (ATROVENT) 0.03 % nasal spray 2 sprays per nostril 2-3 times daily as needed for congestion and post-nasal drainage 10/09/20   Lurene Shadow, PA-C  ?labetalol (NORMODYNE) 200 MG tablet Take 200 mg by mouth 2 (two) times daily.    [provider]  ?Lasmiditan Succinate (REYVOW) 100 MG TABS Take 100 mg by mouth as needed. ?Patient not taking: Reported on 01/03/2021 08/11/19   Glyn Ade, Scot Jun, PA-C  ?levalbuterol [facility] HFA) 45 MCG/ACT inhaler Inhale 2 puffs into the lungs every 4 (four) hours as needed for wheezing.    [provider]  ?levocetirizine (XYZAL) 5 MG tablet Take 1 tablet by mouth every evening. 05/16/21     ?metFORMIN (GLUCOPHAGE) 500 MG tablet Take 1,000 mg by mouth 2 (two) times daily. 01/26/19   [provider]  ?methocarbamol (ROBAXIN) 750 MG tablet Take 1-2 tablets (750-1,500 mg total) by mouth every 8 (eight) hours as needed for muscle spasms and pain. 12/26/20   Logan Bores, MD  ?montelukast (SINGULAIR) 10 MG tablet Take 1 tablet (10 mg total) by mouth daily. 11/29/20     ?ondansetron (ZOFRAN) 4 MG tablet Take 1 tablet (4 mg total) by mouth every 6 (six) hours as needed for nausea. ?Patient not taking: Reported on 01/03/2021 02/14/17   Ovidio Kin, MD  ?pantoprazole (PROTONIX) 20 MG tablet Take 1 tablet (20 mg total) by mouth daily. ?Patient taking differently: Take 20 mg by mouth at bedtime. 05/04/15   Barrett Henle, PA-C  ?pantoprazole (PROTONIX) 20 MG tablet TAKE 1 TABLET BY MOUTH ONCE DAILY (NEEDS OFFICE VISIT) 06/13/21     ?promethazine (PHENERGAN) 25 MG tablet TAKE 1 TABLET (25 MG TOTAL) BY MOUTH EVERY 6 (SIX) HOURS AS NEEDED FOR NAUSEA OR  VOMITING. 11/01/19   Glyn Ade, Scot Jun, PA-C  ?Rimegepant Sulfate (NURTEC) 75 MG TBDP Take 75 mg by mouth as needed (migraine). ?Patient taking differently: Take 75 mg by mouth daily as needed (migraine). 03/31/19   Glyn Ade, Scot Jun, PA-C  ?TOSYMRA 10 MG/ACT SOLN Inject 10 mg into the skin daily as needed (migraine). 09/04/20   [provider]  ?traMADol (ULTRAM) 50 MG tablet Take 50 mg by mouth 2 (two) times daily as needed for moderate pain. 08/20/20   [provider]  ?traMADol (ULTRAM) 50 MG tablet Take 1 tablet (50 mg total) by mouth every 8 (eight) hours as needed for severe pain 01/02/21   Logan Bores, MD  ?   ? ?Allergies    ?Mushroom extract complex, Tylenol with codeine #3 [acetaminophen-codeine], and Betadine [povidone iodine]   ? ?Review of Systems   ?  Review of Systems  ?Gastrointestinal:  Positive for abdominal pain.  ? ?Physical Exam ?Updated Vital Signs ?BP (!) 158/81 (BP Location: Right Arm)   Pulse 79   Temp 98.4 ?F (36.9 ?C) (Oral)   Resp (!) 22   SpO2 98%  ?Physical Exam ?Vitals and nursing note reviewed.  ?Constitutional:   ?   General: She is not in acute distress. ?   Appearance: She is not ill-appearing.  ?HENT:  ?   Head: Atraumatic.  ?Eyes:  ?   Conjunctiva/sclera: Conjunctivae normal.  ?Cardiovascular:  ?   Rate and Rhythm: Normal rate and regular rhythm.  ?   Pulses: Normal pulses.  ?   Heart sounds: No murmur heard. ?Pulmonary:  ?   Effort: Pulmonary effort is normal. No respiratory distress.  ?   Breath sounds: Normal breath sounds.  ?Abdominal:  ?   General: There is no distension.  ?   Palpations: Abdomen is soft.  ?   Tenderness: There is abdominal tenderness in the left lower quadrant.  ?   Comments: Diffuse lower quadrant tenderness, worse on the left side.  Patient notes that seems to originate in the left lower quadrant and radiates towards the right.  ?Musculoskeletal:     ?   General: Normal range of motion.  ?   Cervical back: Normal range of motion.   ?Skin: ?   General: Skin is warm and dry.  ?   Capillary Refill: Capillary refill takes less than 2 seconds.  ?Neurological:  ?   General: No focal deficit present.  ?   Mental Status: She is alert.  ?Psychiatric:

## 2021-10-13 NOTE — ED Provider Triage Note (Signed)
Emergency Medicine Provider Triage Evaluation Note ? ?[name] [name] , a 36 y.o. female  was evaluated in triage.  Pt complains of diffuse lower abdominal pain, nausea, vomiting, and diarrhea for the past 10 days. Noticed bruising on her abdomen within the past 24 hours. Has been able to drink, but not eat. Denies any hematochezia or melena. She has a h/o UC but is s/p resection in 2018. Her Gi doc put her one 50mg  prednisone for the past 5 days without any relief of symptoms. Because of the bruising, she was sent in by GI to r/o hemorrhagic pancreatitis.  ? ?GI - Dr. Oletta Lamas [facility] ? ?Review of Systems  ?Positive: See above ?Negative: See above ? ?Physical Exam  ?BP (!) 166/101   Pulse 77   Temp 98.6 ?F (37 ?C) (Oral)   Resp 16   SpO2 100%  ?Gen:   Awake, no distress   ?Resp:  Normal effort  ?MSK:   Moves extremities without difficulty  ?Other:  Abdomen soft. Small bruising noted. Non-distended. Diffuse lower tenderness mainly on the left. No guarding or rebound appreciated.  ? ?Medical Decision Making  ?Medically screening exam initiated at 4:34 PM.  Appropriate orders placed.  Corinn Lariccia was informed that the remainder of the evaluation will be completed by another provider, this initial triage assessment does not replace that evaluation, and the importance of remaining in the ED until their evaluation is complete. ? ?Will order labs and CT scan.  ?  ?Sherrell Puller, PA-C ?10/13/21 1639 ? ?

## 2021-10-14 ENCOUNTER — Other Ambulatory Visit (HOSPITAL_COMMUNITY): Payer: Self-pay

## 2022-02-11 DIAGNOSIS — F4310 Post-traumatic stress disorder, unspecified: Secondary | ICD-10-CM | POA: Diagnosis not present

## 2022-02-16 DIAGNOSIS — M6289 Other specified disorders of muscle: Secondary | ICD-10-CM | POA: Diagnosis not present

## 2022-02-16 DIAGNOSIS — Z6841 Body Mass Index (BMI) 40.0 and over, adult: Secondary | ICD-10-CM | POA: Diagnosis not present

## 2022-02-16 DIAGNOSIS — N809 Endometriosis, unspecified: Secondary | ICD-10-CM | POA: Diagnosis not present

## 2022-02-16 DIAGNOSIS — R102 Pelvic and perineal pain: Secondary | ICD-10-CM | POA: Diagnosis not present

## 2022-02-18 DIAGNOSIS — M542 Cervicalgia: Secondary | ICD-10-CM | POA: Diagnosis not present

## 2022-02-18 DIAGNOSIS — G518 Other disorders of facial nerve: Secondary | ICD-10-CM | POA: Diagnosis not present

## 2022-02-18 DIAGNOSIS — G43719 Chronic migraine without aura, intractable, without status migrainosus: Secondary | ICD-10-CM | POA: Diagnosis not present

## 2022-02-18 DIAGNOSIS — M791 Myalgia, unspecified site: Secondary | ICD-10-CM | POA: Diagnosis not present

## 2022-02-25 DIAGNOSIS — N858 Other specified noninflammatory disorders of uterus: Secondary | ICD-10-CM | POA: Diagnosis not present

## 2022-02-25 DIAGNOSIS — N83201 Unspecified ovarian cyst, right side: Secondary | ICD-10-CM | POA: Diagnosis not present

## 2022-02-25 DIAGNOSIS — N83202 Unspecified ovarian cyst, left side: Secondary | ICD-10-CM | POA: Diagnosis not present

## 2022-02-25 DIAGNOSIS — N809 Endometriosis, unspecified: Secondary | ICD-10-CM | POA: Diagnosis not present

## 2022-02-27 ENCOUNTER — Emergency Department (HOSPITAL_BASED_OUTPATIENT_CLINIC_OR_DEPARTMENT_OTHER)
Admission: EM | Admit: 2022-02-27 | Discharge: 2022-02-27 | Disposition: A | Discharge status: Home / Self Care | Payer: BC Managed Care – PPO | Attending: Emergency Medicine | Admitting: Emergency Medicine

## 2022-02-27 ENCOUNTER — Encounter (HOSPITAL_BASED_OUTPATIENT_CLINIC_OR_DEPARTMENT_OTHER): Payer: Self-pay | Admitting: Emergency Medicine

## 2022-02-27 DIAGNOSIS — R110 Nausea: Secondary | ICD-10-CM | POA: Insufficient documentation

## 2022-02-27 DIAGNOSIS — R102 Pelvic and perineal pain: Secondary | ICD-10-CM | POA: Diagnosis not present

## 2022-02-27 LAB — COMPREHENSIVE METABOLIC PANEL
ALT: 19 U/L (ref 0–44)
AST: 18 U/L (ref 15–41)
Albumin: 3.9 g/dL (ref 3.5–5.0)
Alkaline Phosphatase: 41 U/L (ref 38–126)
Anion gap: 9 (ref 5–15)
BUN: 21 mg/dL — ABNORMAL HIGH (ref 6–20)
CO2: 22 mmol/L (ref 22–32)
Calcium: 9 mg/dL (ref 8.9–10.3)
Chloride: 108 mmol/L (ref 98–111)
Creatinine, Ser: 0.96 mg/dL (ref 0.44–1.00)
GFR, Estimated: >60 mL/min (ref >60)
Glucose, Bld: 83 mg/dL (ref 70–99)
Potassium: 3.8 mmol/L (ref 3.5–5.1)
Sodium: 139 mmol/L (ref 135–145)
Total Bilirubin: 0.6 mg/dL (ref 0.3–1.2)
Total Protein: 7.1 g/dL (ref 6.5–8.1)

## 2022-02-27 LAB — CBC
HCT: 34.7 % — ABNORMAL LOW (ref 36.0–46.0)
Hemoglobin: 11.8 g/dL — ABNORMAL LOW (ref 12.0–15.0)
MCH: 30 pg (ref 26.0–34.0)
MCHC: 34 g/dL (ref 30.0–36.0)
MCV: 88.3 fL (ref 80.0–100.0)
Platelets: 313 10*3/uL (ref 150–400)
RBC: 3.93 MIL/uL (ref 3.87–5.11)
RDW: 13 % (ref 11.5–15.5)
WBC: 8.6 10*3/uL (ref 4.0–10.5)
nRBC: 0 % (ref 0.0–0.2)

## 2022-02-27 LAB — URINALYSIS, MICROSCOPIC (REFLEX)

## 2022-02-27 LAB — URINALYSIS, ROUTINE W REFLEX MICROSCOPIC
Bilirubin Urine: NEGATIVE
Glucose, UA: NEGATIVE mg/dL
Hgb urine dipstick: NEGATIVE
Ketones, ur: NEGATIVE mg/dL
Nitrite: NEGATIVE
Protein, ur: NEGATIVE mg/dL
Specific Gravity, Urine: 1.025 (ref 1.005–1.030)
pH: 5.5 (ref 5.0–8.0)

## 2022-02-27 LAB — LIPASE, BLOOD: Lipase: 26 U/L (ref 11–51)

## 2022-02-27 LAB — PREGNANCY, URINE: Preg Test, Ur: NEGATIVE

## 2022-02-27 MED ORDER — HYDROMORPHONE HCL 1 MG/ML IJ SOLN
1.0000 mg | Freq: Once | INTRAMUSCULAR | Status: AC
  Administered 2022-02-27: 1 mg via INTRAVENOUS
  Filled 2022-02-27: qty 1

## 2022-02-27 MED ORDER — TRAMADOL HCL 50 MG PO TABS: 50.0000 mg | ORAL_TABLET | Freq: Four times a day (QID) | ORAL | 0 refills | Status: DC | PRN

## 2022-02-27 MED ORDER — SODIUM CHLORIDE 0.9 % IV BOLUS
1000.0000 mL | Freq: Once | INTRAVENOUS | Status: AC
Start: 2022-02-27 — End: 2022-02-27
  Administered 2022-02-27: 1000 mL via INTRAVENOUS

## 2022-02-27 MED ORDER — ONDANSETRON HCL 4 MG/2ML IJ SOLN
4.0000 mg | Freq: Once | INTRAMUSCULAR | Status: AC
  Administered 2022-02-27: 4 mg via INTRAVENOUS
  Filled 2022-02-27: qty 2

## 2022-02-27 MED ORDER — PROMETHAZINE HCL 25 MG PO TABS: 25.0000 mg | ORAL_TABLET | Freq: Four times a day (QID) | ORAL | 0 refills | Status: DC | PRN

## 2022-02-27 NOTE — ED Provider Notes (Signed)
[facility] EMERGENCY DEPARTMENT Provider Note   CSN: [id number] Arrival date & time: 02/27/22  1723     History  Chief Complaint  Patient presents with   Abdominal Pain    [name] [name] is a 36 y.o. female.  Patient complains of pain from endometriosis.  Patient is currently being followed by her gynecologist.  She had a recent MRI of her pelvis.  Patient has had multiple ultrasounds in the past.  Patient reports this pain is an exacerbation of her endometriosis this is the same pain that she has had previously.  Patient complains of nausea.  Patient denies any vaginal bleeding she denies any urinary symptoms.  Patient denies any risk of sexually transmitted disease  The history is provided by the patient. No language interpreter was used.  Abdominal Pain Pain location:  Suprapubic Pain quality: aching and cramping   Pain radiates to:  Does not radiate Pain severity:  Moderate Onset quality:  Gradual Duration:  3 days Timing:  Constant Chronicity:  New Context: not alcohol use, not sick contacts and not suspicious food intake   Worsened by:  Nothing Ineffective treatments:  None tried Associated symptoms: no fever        Home Medications Prior to Admission medications   Medication Sig Start Date End Date Taking? Authorizing Provider  acetaminophen (TYLENOL) 500 MG tablet Take 1,000 mg by mouth every 6 (six) hours as needed for headache.    [provider]  albuterol (PROVENTIL) (5 MG/ML) 0.5% nebulizer solution Inhale 1/2 vial (2.5 mg total) via nebulizer every 6 (six) hours as needed for wheezing or shortness of breath. 11/02/20   Rancour, Jeannett Senior, MD  azelastine (ASTELIN) 0.1 % nasal spray Spray 1-2 puffs in each nostril twice a day 11/29/20     butorphanol (STADOL) 10 MG/ML nasal spray Use 1 spray in one nostril as needed every 6 hours for 30 days 07/16/21     cetirizine (ZYRTEC) 10 MG tablet Take 10 mg by mouth daily. Patient not taking: Reported on  01/03/2021    [provider]  clonazePAM (KLONOPIN) 0.5 MG tablet Take 0.25 mg by mouth at bedtime as needed for anxiety.    [provider]  cyclobenzaprine (FLEXERIL) 10 MG tablet Take 1 tablet (10 mg total) by mouth 3 (three) times daily as needed for muscle spasms. 03/31/19   Glyn Ade, Scot Jun, PA-C  diphenhydrAMINE (BENADRYL) 50 MG tablet Take 50 mg by mouth daily as needed for itching.    [provider]  EPINEPHrine 0.3 mg/0.3 mL IJ SOAJ injection Inject 0.3 mg into the muscle as needed for anaphylaxis. Patient taking differently: Inject 0.3 mg into the muscle once as needed for anaphylaxis. 10/15/20   Gilda Crease, MD  escitalopram (LEXAPRO) 20 MG tablet Take 20 mg by mouth daily.    [provider]  famotidine (PEPCID) 20 MG tablet Take 1 tablet (20 mg total) by mouth 2 (two) times daily for 7 days. 10/19/20 10/26/20  Jerald Kief, MD  fluticasone (FLONASE) 50 MCG/ACT nasal spray Place 2 sprays into both nostrils daily. 09/23/17   Waldon Merl, PA-C  fluticasone furoate-vilanterol (BREO ELLIPTA) 200-25 MCG/ACT AEPB Inhale into the lungs once daily as instructed. 05/02/21     fluticasone-salmeterol (ADVAIR HFA) 115-21 MCG/ACT inhaler Inhale 2 puffs by mouth into the lungs twice a day 10/23/20     hydrOXYzine (ATARAX) 10 MG tablet Take 1 tablet (10 mg total) by mouth every 8 (eight) hours as needed.  06/13/21     ibuprofen (ADVIL) 200 MG tablet Take 800 mg by mouth every 6 (six) hours as needed for headache.    [provider]  ipratropium (ATROVENT) 0.03 % nasal spray 2 sprays per nostril 2-3 times daily as needed for congestion and post-nasal drainage 10/09/20   Lurene Shadow, PA-C  labetalol (NORMODYNE) 200 MG tablet Take 200 mg by mouth 2 (two) times daily.    [provider]  Lasmiditan Succinate (REYVOW) 100 MG TABS Take 100 mg by mouth as needed. Patient not taking: Reported on 01/03/2021 08/11/19   Glyn Ade, Scot Jun,  PA-C  levalbuterol [facility] HFA) 45 MCG/ACT inhaler Inhale 2 puffs into the lungs every 4 (four) hours as needed for wheezing.    [provider]  levocetirizine (XYZAL) 5 MG tablet Take 1 tablet by mouth every evening. 05/16/21     metFORMIN (GLUCOPHAGE) 500 MG tablet Take 1,000 mg by mouth 2 (two) times daily. 01/26/19   [provider]  methocarbamol (ROBAXIN) 750 MG tablet Take 1-2 tablets (750-1,500 mg total) by mouth every 8 (eight) hours as needed for muscle spasms and pain. 12/26/20   Logan Bores, MD  montelukast (SINGULAIR) 10 MG tablet Take 1 tablet (10 mg total) by mouth daily. 11/29/20     naproxen (NAPROSYN) 500 MG tablet Take 1 tablet (500 mg total) by mouth 2 (two) times daily. 10/13/21   Kommor, Madison, MD  ondansetron (ZOFRAN) 4 MG tablet Take 1 tablet (4 mg total) by mouth every 6 (six) hours as needed for nausea. Patient not taking: Reported on 01/03/2021 02/14/17   Ovidio Kin, MD  oxyCODONE (ROXICODONE) 5 MG immediate release tablet Take 1 tablet (5 mg total) by mouth every 4 (four) hours as needed for severe pain. 10/13/21   Kommor, Madison, MD  pantoprazole (PROTONIX) 20 MG tablet Take 1 tablet (20 mg total) by mouth daily. Patient taking differently: Take 20 mg by mouth at bedtime. 05/04/15   Barrett Henle, PA-C  pantoprazole (PROTONIX) 20 MG tablet TAKE 1 TABLET BY MOUTH ONCE DAILY (NEEDS OFFICE VISIT) 06/13/21     promethazine (PHENERGAN) 25 MG tablet TAKE 1 TABLET (25 MG TOTAL) BY MOUTH EVERY 6 (SIX) HOURS AS NEEDED FOR NAUSEA OR VOMITING. 11/01/19   Glyn Ade, Scot Jun, PA-C  Rimegepant Sulfate (NURTEC) 75 MG TBDP Take 75 mg by mouth as needed (migraine). Patient taking differently: Take 75 mg by mouth daily as needed (migraine). 03/31/19   Teague Chestine Spore, Scot Jun, PA-C  TOSYMRA 10 MG/ACT SOLN Inject 10 mg into the skin daily as needed (migraine). 09/04/20   [provider]      Allergies    Mushroom extract complex, Tylenol with codeine #3  [acetaminophen-codeine], and Betadine [povidone iodine]    Review of Systems   Review of Systems  Constitutional:  Negative for fever.  Gastrointestinal:  Positive for abdominal pain.  All other systems reviewed and are negative.   Physical Exam Updated Vital Signs BP (!) 140/83   Pulse 75   Resp 15   Ht 5\' 1"  (1.549 m)   Wt 104.3 kg   SpO2 97%   BMI 43.46 kg/m  Physical Exam Vitals and nursing note reviewed.  Constitutional:      Appearance: She is well-developed.  HENT:     Head: Normocephalic.  Cardiovascular:     Rate and Rhythm: Normal rate and regular rhythm.  Pulmonary:     Effort: Pulmonary effort is normal.  Abdominal:  General: Abdomen is flat. There is no distension.     Palpations: Abdomen is soft.     Tenderness: There is abdominal tenderness.     Hernia: No hernia is present.  Musculoskeletal:        General: Normal range of motion.     Cervical back: Normal range of motion.  Skin:    General: Skin is warm.  Neurological:     General: No focal deficit present.     Mental Status: She is alert and oriented to person, place, and time.     ED Results / Procedures / Treatments   Labs (all labs ordered are listed, but only abnormal results are displayed) Labs Reviewed  COMPREHENSIVE METABOLIC PANEL - Abnormal; Notable for the following components:      Result Value   BUN 21 (*)    All other components within normal limits  CBC - Abnormal; Notable for the following components:   Hemoglobin 11.8 (*)    HCT 34.7 (*)    All other components within normal limits  URINALYSIS, ROUTINE W REFLEX MICROSCOPIC - Abnormal; Notable for the following components:   Leukocytes,Ua TRACE (*)    All other components within normal limits  URINALYSIS, MICROSCOPIC (REFLEX) - Abnormal; Notable for the following components:   Bacteria, UA FEW (*)    All other components within normal limits  LIPASE, BLOOD  PREGNANCY, URINE    EKG None  Radiology No results  found.  Procedures Procedures    Medications Ordered in ED Medications  sodium chloride 0.9 % bolus 1,000 mL (0 mLs Intravenous Stopped 02/27/22 2237)  HYDROmorphone (DILAUDID) injection 1 mg (1 mg Intravenous Given 02/27/22 2118)  ondansetron [facility]) injection 4 mg (4 mg Intravenous Given 02/27/22 2118)    ED Course/ Medical Decision Making/ A&P                           Medical Decision Making Patient complains of pain from endometriosis  Amount and/or Complexity of Data Reviewed Independent Historian: caregiver    Details: Patient here with family who is supportive External Data Reviewed: notes.    Details: Gynecology notes reviewed Labs: ordered. Decision-making details documented in ED Course.    Details: Labs ordered, reviewed and interpreted,  pt has a normal hemoglobin  Risk Prescription drug management. Risk Details: Patient given IV fluids Zofran and Dilaudid IV patient reports her pain is much improved.  Symptoms consistent with her exacerbation of endometriosis in the past.  Patient is advised to follow-up with her gynecologist on Monday for results of her MRI scan and further evaluation.  Patient is given a prescription of tramadol 10 tablets and Phenergan 10 tablets.           Final Clinical Impression(s) / ED Diagnoses Final diagnoses:  Pelvic pain in female    Rx / DC Orders ED Discharge Orders          Ordered    traMADol (ULTRAM) 50 MG tablet  Every 6 hours PRN        02/27/22 2335    promethazine (PHENERGAN) 25 MG tablet  Every 6 hours PRN        02/27/22 2335              Elson Areas, PA-C 02/27/22 2342    Melene Plan, DO 03/01/22 [phone]

## 2022-02-27 NOTE — ED Triage Notes (Signed)
Patient presents POV C/O RLQ abd pain X3 days. Hx of endometriosis and spoke with her OB who referred her to ER d/t pain. Last took OTC medication approx 3PM

## 2022-02-27 NOTE — Discharge Instructions (Addendum)
Follow up with your gynecologist on Monday for recheck

## 2022-03-04 DIAGNOSIS — N809 Endometriosis, unspecified: Secondary | ICD-10-CM | POA: Diagnosis not present

## 2022-03-04 DIAGNOSIS — M791 Myalgia, unspecified site: Secondary | ICD-10-CM | POA: Diagnosis not present

## 2022-03-04 DIAGNOSIS — G43719 Chronic migraine without aura, intractable, without status migrainosus: Secondary | ICD-10-CM | POA: Diagnosis not present

## 2022-03-04 DIAGNOSIS — E2839 Other primary ovarian failure: Secondary | ICD-10-CM | POA: Diagnosis not present

## 2022-03-04 DIAGNOSIS — G518 Other disorders of facial nerve: Secondary | ICD-10-CM | POA: Diagnosis not present

## 2022-03-04 DIAGNOSIS — R102 Pelvic and perineal pain: Secondary | ICD-10-CM | POA: Diagnosis not present

## 2022-03-04 DIAGNOSIS — E288 Other ovarian dysfunction: Secondary | ICD-10-CM | POA: Diagnosis not present

## 2022-03-04 DIAGNOSIS — M542 Cervicalgia: Secondary | ICD-10-CM | POA: Diagnosis not present

## 2022-03-05 ENCOUNTER — Encounter (HOSPITAL_BASED_OUTPATIENT_CLINIC_OR_DEPARTMENT_OTHER): Payer: Self-pay | Admitting: Obstetrics and Gynecology

## 2022-03-05 ENCOUNTER — Other Ambulatory Visit: Payer: Self-pay

## 2022-03-05 ENCOUNTER — Emergency Department (HOSPITAL_COMMUNITY): Payer: BC Managed Care – PPO

## 2022-03-05 ENCOUNTER — Emergency Department (HOSPITAL_BASED_OUTPATIENT_CLINIC_OR_DEPARTMENT_OTHER): Payer: BC Managed Care – PPO

## 2022-03-05 ENCOUNTER — Emergency Department (HOSPITAL_BASED_OUTPATIENT_CLINIC_OR_DEPARTMENT_OTHER)
Admission: EM | Admit: 2022-03-05 | Discharge: 2022-03-06 | Disposition: A | Discharge status: Home / Self Care | Payer: BC Managed Care – PPO | Attending: Emergency Medicine | Admitting: Emergency Medicine

## 2022-03-05 DIAGNOSIS — N83201 Unspecified ovarian cyst, right side: Secondary | ICD-10-CM | POA: Diagnosis not present

## 2022-03-05 DIAGNOSIS — R1031 Right lower quadrant pain: Secondary | ICD-10-CM | POA: Diagnosis not present

## 2022-03-05 DIAGNOSIS — R109 Unspecified abdominal pain: Secondary | ICD-10-CM | POA: Diagnosis not present

## 2022-03-05 DIAGNOSIS — R1030 Lower abdominal pain, unspecified: Secondary | ICD-10-CM

## 2022-03-05 DIAGNOSIS — N83202 Unspecified ovarian cyst, left side: Secondary | ICD-10-CM | POA: Diagnosis not present

## 2022-03-05 DIAGNOSIS — Z79899 Other long term (current) drug therapy: Secondary | ICD-10-CM | POA: Diagnosis not present

## 2022-03-05 LAB — CBC WITH DIFFERENTIAL/PLATELET
Abs Immature Granulocytes: 0.05 10*3/uL (ref 0.00–0.07)
Basophils Absolute: 0.1 10*3/uL (ref 0.0–0.1)
Basophils Relative: 0 %
Eosinophils Absolute: 0.1 10*3/uL (ref 0.0–0.5)
Eosinophils Relative: 0 %
HCT: 35.4 % — ABNORMAL LOW (ref 36.0–46.0)
Hemoglobin: 12.2 g/dL (ref 12.0–15.0)
Immature Granulocytes: 0 %
Lymphocytes Relative: 21 %
Lymphs Abs: 2.8 10*3/uL (ref 0.7–4.0)
MCH: 30.5 pg (ref 26.0–34.0)
MCHC: 34.5 g/dL (ref 30.0–36.0)
MCV: 88.5 fL (ref 80.0–100.0)
Monocytes Absolute: 0.7 10*3/uL (ref 0.1–1.0)
Monocytes Relative: 6 %
Neutro Abs: 9.8 10*3/uL — ABNORMAL HIGH (ref 1.7–7.7)
Neutrophils Relative %: 73 %
Platelets: 344 10*3/uL (ref 150–400)
RBC: 4 MIL/uL (ref 3.87–5.11)
RDW: 13.2 % (ref 11.5–15.5)
WBC: 13.4 10*3/uL — ABNORMAL HIGH (ref 4.0–10.5)
nRBC: 0 % (ref 0.0–0.2)

## 2022-03-05 LAB — COMPREHENSIVE METABOLIC PANEL
ALT: 13 U/L (ref 0–44)
AST: 12 U/L — ABNORMAL LOW (ref 15–41)
Albumin: 4.5 g/dL (ref 3.5–5.0)
Alkaline Phosphatase: 39 U/L (ref 38–126)
Anion gap: 11 (ref 5–15)
BUN: 15 mg/dL (ref 6–20)
CO2: 21 mmol/L — ABNORMAL LOW (ref 22–32)
Calcium: 9.5 mg/dL (ref 8.9–10.3)
Chloride: 106 mmol/L (ref 98–111)
Creatinine, Ser: 0.95 mg/dL (ref 0.44–1.00)
GFR, Estimated: >60 mL/min (ref >60)
Glucose, Bld: 89 mg/dL (ref 70–99)
Potassium: 3.4 mmol/L — ABNORMAL LOW (ref 3.5–5.1)
Sodium: 138 mmol/L (ref 135–145)
Total Bilirubin: 0.3 mg/dL (ref 0.3–1.2)
Total Protein: 7.2 g/dL (ref 6.5–8.1)

## 2022-03-05 LAB — URINALYSIS, ROUTINE W REFLEX MICROSCOPIC
Bilirubin Urine: NEGATIVE
Glucose, UA: NEGATIVE mg/dL
Hgb urine dipstick: NEGATIVE
Nitrite: NEGATIVE
Protein, ur: 30 mg/dL — AB
Specific Gravity, Urine: 1.038 — ABNORMAL HIGH (ref 1.005–1.030)
pH: 6 (ref 5.0–8.0)

## 2022-03-05 LAB — LIPASE, BLOOD: Lipase: <10 U/L — ABNORMAL LOW (ref 11–51)

## 2022-03-05 LAB — HCG, QUANTITATIVE, PREGNANCY: hCG, Beta Chain, Quant, S: <1 m[IU]/mL (ref <5)

## 2022-03-05 MED ORDER — SODIUM CHLORIDE 0.9 % IV BOLUS
1000.0000 mL | Freq: Once | INTRAVENOUS | Status: AC
Start: 2022-03-05 — End: 2022-03-06
  Administered 2022-03-05: 1000 mL via INTRAVENOUS

## 2022-03-05 MED ORDER — HYDROMORPHONE HCL 1 MG/ML IJ SOLN
0.5000 mg | Freq: Once | INTRAMUSCULAR | Status: AC
  Administered 2022-03-05: 0.5 mg via INTRAVENOUS
  Filled 2022-03-05: qty 1

## 2022-03-05 MED ORDER — ONDANSETRON HCL 4 MG/2ML IJ SOLN
4.0000 mg | Freq: Once | INTRAMUSCULAR | Status: AC
  Administered 2022-03-05: 4 mg via INTRAVENOUS
  Filled 2022-03-05: qty 2

## 2022-03-05 MED ORDER — PROMETHAZINE HCL 25 MG/ML IJ SOLN
INTRAMUSCULAR | Status: AC
  Filled 2022-03-05: qty 1

## 2022-03-05 MED ORDER — PROCHLORPERAZINE EDISYLATE 10 MG/2ML IJ SOLN
10.0000 mg | Freq: Once | INTRAMUSCULAR | Status: AC
  Administered 2022-03-05: 10 mg via INTRAVENOUS
  Filled 2022-03-05: qty 2

## 2022-03-05 MED ORDER — SODIUM CHLORIDE 0.9 % IV BOLUS
1000.0000 mL | Freq: Once | INTRAVENOUS | Status: AC
  Administered 2022-03-05: 1000 mL via INTRAVENOUS

## 2022-03-05 MED ORDER — HYDROMORPHONE HCL 1 MG/ML IJ SOLN
1.0000 mg | Freq: Once | INTRAMUSCULAR | Status: AC
  Administered 2022-03-05: 1 mg via INTRAVENOUS
  Filled 2022-03-05: qty 1

## 2022-03-05 MED ORDER — SODIUM CHLORIDE 0.9 % IV SOLN
12.5000 mg | Freq: Once | INTRAVENOUS | Status: AC
  Administered 2022-03-05: 12.5 mg via INTRAVENOUS
  Filled 2022-03-05: qty 0.5

## 2022-03-05 NOTE — Discharge Instructions (Addendum)
Head to [facility] emergency department to obtain your ultrasound.

## 2022-03-05 NOTE — ED Provider Notes (Signed)
I assumed care of this patient.  Please see previous provider note for further details of Hx, PE.  Briefly patient is a 36 y.o. female who presented with abdominal pain right greater than left.  Sent here from [facility] to rule out torsion  Ultrasound negative Patient also noted to have leukocytosis. We will need CT to rule out appendicitis  CT ordered and personally reviewed.  No evidence of appendicitis or other acute intra-abdominal inflammatory/infectious process  Pain controlled with IV meds  Safe for discharge.  The patient appears reasonably screened and/or stabilized for discharge and I doubt any other medical condition or other [facility] requiring further screening, evaluation, or treatment in the ED at this time. I have discussed the findings, Dx and Tx plan with the patient/family who expressed understanding and agree(s) with the plan. Discharge instructions discussed at length. The patient/family was given strict return precautions who verbalized understanding of the instructions. No further questions at time of discharge.  Disposition: Discharge  Condition: Good  ED Discharge Orders          Ordered    traMADol (ULTRAM) 50 MG tablet  Every 6 hours PRN        03/06/22 0132    prochlorperazine (COMPAZINE) 25 MG suppository  Every 12 hours PRN        03/06/22 0132            [facility] narcotic database reviewed and no active prescriptions noted.   Follow Up: Joycelyn Rua, MD [address] [place] [address] [phone]  Call  to schedule an appointment for close follow up         Berniece Abid, Amadeo Garnet, MD 03/06/22 [phone]

## 2022-03-05 NOTE — ED Notes (Signed)
AVS printed with instructions for pt to report to [facility] for [place]. Patient verbalized understanding and where to go. Family at bedside to transport to [facility].

## 2022-03-05 NOTE — ED Provider Notes (Signed)
[facility] EMERGENCY DEPT Provider Note   CSN: [id number] Arrival date & time: 03/05/22  1324     History  Chief Complaint  Patient presents with   Abdominal Pain    [name] [name] is a 36 y.o. female with a past medical history of endometriosis and ovarian torsion who presents to the emergency department with acute lower abdominal pain onset 4 AM this morning.  Notes that pain is worse on the right than the left side.  Patient has surgery planned with her OB/GYN in December 2023 for excision and removal of left tubal [place] checked oophorectomy. She notes that her pain feels similar to past flares of endometriosis.  Tried tramadol at 8 AM and Zofran without relief of her symptoms.  Also tried Phenergan without relief.  Denies fever, urinary symptoms, vaginal bleeding/discharge, diarrhea, constipation.    The history is provided by the patient. No language interpreter was used.       Home Medications Prior to Admission medications   Medication Sig Start Date End Date Taking? Authorizing Provider  acetaminophen (TYLENOL) 500 MG tablet Take 1,000 mg by mouth every 6 (six) hours as needed for headache.    [provider]  albuterol (PROVENTIL) (5 MG/ML) 0.5% nebulizer solution Inhale 1/2 vial (2.5 mg total) via nebulizer every 6 (six) hours as needed for wheezing or shortness of breath. 11/02/20   Rancour, Jeannett Senior, MD  azelastine (ASTELIN) 0.1 % nasal spray Spray 1-2 puffs in each nostril twice a day 11/29/20     butorphanol (STADOL) 10 MG/ML nasal spray Use 1 spray in one nostril as needed every 6 hours for 30 days 07/16/21     cetirizine (ZYRTEC) 10 MG tablet Take 10 mg by mouth daily. Patient not taking: Reported on 01/03/2021    [provider]  clonazePAM (KLONOPIN) 0.5 MG tablet Take 0.25 mg by mouth at bedtime as needed for anxiety.    [provider]  cyclobenzaprine (FLEXERIL) 10 MG tablet Take 1 tablet (10 mg total) by mouth 3 (three)  times daily as needed for muscle spasms. 03/31/19   Glyn Ade, Scot Jun, PA-C  diphenhydrAMINE (BENADRYL) 50 MG tablet Take 50 mg by mouth daily as needed for itching.    [provider]  EPINEPHrine 0.3 mg/0.3 mL IJ SOAJ injection Inject 0.3 mg into the muscle as needed for anaphylaxis. Patient taking differently: Inject 0.3 mg into the muscle once as needed for anaphylaxis. 10/15/20   Gilda Crease, MD  escitalopram (LEXAPRO) 20 MG tablet Take 20 mg by mouth daily.    [provider]  famotidine (PEPCID) 20 MG tablet Take 1 tablet (20 mg total) by mouth 2 (two) times daily for 7 days. 10/19/20 10/26/20  Jerald Kief, MD  fluticasone (FLONASE) 50 MCG/ACT nasal spray Place 2 sprays into both nostrils daily. 09/23/17   Waldon Merl, PA-C  fluticasone furoate-vilanterol (BREO ELLIPTA) 200-25 MCG/ACT AEPB Inhale into the lungs once daily as instructed. 05/02/21     fluticasone-salmeterol (ADVAIR HFA) 115-21 MCG/ACT inhaler Inhale 2 puffs by mouth into the lungs twice a day 10/23/20     hydrOXYzine (ATARAX) 10 MG tablet Take 1 tablet (10 mg total) by mouth every 8 (eight) hours as needed. 06/13/21     ibuprofen (ADVIL) 200 MG tablet Take 800 mg by mouth every 6 (six) hours as needed for headache.    [provider]  ipratropium (ATROVENT) 0.03 % nasal spray 2 sprays per nostril 2-3 times daily as needed for  congestion and post-nasal drainage 10/09/20   Lurene Shadow, PA-C  labetalol (NORMODYNE) 200 MG tablet Take 200 mg by mouth 2 (two) times daily.    [provider]  Lasmiditan Succinate (REYVOW) 100 MG TABS Take 100 mg by mouth as needed. Patient not taking: Reported on 01/03/2021 08/11/19   Glyn Ade, Scot Jun, PA-C  levalbuterol [facility] HFA) 45 MCG/ACT inhaler Inhale 2 puffs into the lungs every 4 (four) hours as needed for wheezing.    [provider]  levocetirizine (XYZAL) 5 MG tablet Take 1 tablet by mouth every evening. 05/16/21      metFORMIN (GLUCOPHAGE) 500 MG tablet Take 1,000 mg by mouth 2 (two) times daily. 01/26/19   [provider]  methocarbamol (ROBAXIN) 750 MG tablet Take 1-2 tablets (750-1,500 mg total) by mouth every 8 (eight) hours as needed for muscle spasms and pain. 12/26/20   Logan Bores, MD  montelukast (SINGULAIR) 10 MG tablet Take 1 tablet (10 mg total) by mouth daily. 11/29/20     naproxen (NAPROSYN) 500 MG tablet Take 1 tablet (500 mg total) by mouth 2 (two) times daily. 10/13/21   Kommor, Madison, MD  ondansetron (ZOFRAN) 4 MG tablet Take 1 tablet (4 mg total) by mouth every 6 (six) hours as needed for nausea. Patient not taking: Reported on 01/03/2021 02/14/17   Ovidio Kin, MD  oxyCODONE (ROXICODONE) 5 MG immediate release tablet Take 1 tablet (5 mg total) by mouth every 4 (four) hours as needed for severe pain. 10/13/21   Kommor, Madison, MD  pantoprazole (PROTONIX) 20 MG tablet Take 1 tablet (20 mg total) by mouth daily. Patient taking differently: Take 20 mg by mouth at bedtime. 05/04/15   Barrett Henle, PA-C  pantoprazole (PROTONIX) 20 MG tablet TAKE 1 TABLET BY MOUTH ONCE DAILY (NEEDS OFFICE VISIT) 06/13/21     promethazine (PHENERGAN) 25 MG tablet Take 1 tablet (25 mg total) by mouth every 6 (six) hours as needed for nausea or vomiting. 02/27/22   Elson Areas, PA-C  Rimegepant Sulfate (NURTEC) 75 MG TBDP Take 75 mg by mouth as needed (migraine). Patient taking differently: Take 75 mg by mouth daily as needed (migraine). 03/31/19   Teague Chestine Spore, Scot Jun, PA-C  TOSYMRA 10 MG/ACT SOLN Inject 10 mg into the skin daily as needed (migraine). 09/04/20   [provider]  traMADol (ULTRAM) 50 MG tablet Take 1 tablet (50 mg total) by mouth every 6 (six) hours as needed. 02/27/22 02/27/23  Elson Areas, PA-C      Allergies    Mushroom extract complex, Tylenol with codeine #3 [acetaminophen-codeine], and Betadine [povidone iodine]    Review of Systems   Review of Systems   Constitutional:  Negative for fever.  Gastrointestinal:  Positive for abdominal pain, nausea and vomiting. Negative for constipation and diarrhea.  Genitourinary:  Negative for dysuria, hematuria, vaginal bleeding and vaginal discharge.  All other systems reviewed and are negative.   Physical Exam Updated Vital Signs BP 107/83   Pulse (!) 102   Temp 99.2 F (37.3 C) (Oral)   Resp 20   Ht 5\' 1"  (1.549 m)   Wt 104.3 kg   SpO2 97%   BMI 43.46 kg/m  Physical Exam Vitals and nursing note reviewed.  Constitutional:      General: She is not in acute distress.    Appearance: She is not diaphoretic.  HENT:     Head: Normocephalic and atraumatic.     Mouth/Throat:  Pharynx: No oropharyngeal exudate.  Eyes:     General: No scleral icterus.    Conjunctiva/sclera: Conjunctivae normal.  Cardiovascular:     Rate and Rhythm: Normal rate and regular rhythm.     Pulses: Normal pulses.     Heart sounds: Normal heart sounds.  Pulmonary:     Effort: Pulmonary effort is normal. No respiratory distress.     Breath sounds: Normal breath sounds. No wheezing.  Abdominal:     General: Bowel sounds are normal.     Palpations: Abdomen is soft. There is no mass.     Tenderness: There is abdominal tenderness in the right lower quadrant, suprapubic area and left lower quadrant. There is no guarding or rebound.     Comments: Tenderness to palpation noted to lower abdominal region, right> left.  Musculoskeletal:        General: Normal range of motion.     Cervical back: Normal range of motion and neck supple.  Skin:    General: Skin is warm and dry.  Neurological:     Mental Status: She is alert.  Psychiatric:        Behavior: Behavior normal.     ED Results / Procedures / Treatments   Labs (all labs ordered are listed, but only abnormal results are displayed) Labs Reviewed  CBC WITH DIFFERENTIAL/PLATELET - Abnormal; Notable for the following components:      Result Value   WBC 13.4  (*)    HCT 35.4 (*)    Neutro Abs 9.8 (*)    All other components within normal limits  COMPREHENSIVE METABOLIC PANEL - Abnormal; Notable for the following components:   Potassium 3.4 (*)    CO2 21 (*)    AST 12 (*)    All other components within normal limits  LIPASE, BLOOD - Abnormal; Notable for the following components:   Lipase <10 (*)    All other components within normal limits  URINALYSIS, ROUTINE W REFLEX MICROSCOPIC - Abnormal; Notable for the following components:   Specific Gravity, Urine 1.038 (*)    Ketones, ur TRACE (*)    Protein, ur 30 (*)    Leukocytes,Ua SMALL (*)    Bacteria, UA RARE (*)    All other components within normal limits  HCG, QUANTITATIVE, PREGNANCY    EKG None  Radiology No results found.  Procedures Procedures    Medications Ordered in ED Medications  promethazine (PHENERGAN) 25 MG/ML injection (  Not Given 03/05/22 1803)  sodium chloride 0.9 % bolus 1,000 mL (0 mLs Intravenous Stopped 03/05/22 1804)  ondansetron (ZOFRAN) injection 4 mg (4 mg Intravenous Given 03/05/22 1550)  HYDROmorphone (DILAUDID) injection 0.5 mg (0.5 mg Intravenous Given 03/05/22 1550)  promethazine (PHENERGAN) 12.5 mg in sodium chloride 0.9 % 50 mL IVPB (0 mg Intravenous Stopped 03/05/22 1847)  HYDROmorphone (DILAUDID) injection 0.5 mg (0.5 mg Intravenous Given 03/05/22 1839)    ED Course/ Medical Decision Making/ A&P Clinical Course as of 03/05/22 1937  Thu Mar 05, 2022  1550 Patient reevaluated and discussed with patient lab findings and treatment plan.  Patient agreeable at this time. [SB]  1656 Re- evaluated and patient noted to still have lower abdominal pain at this time. [SB]  1824 Discussed with patient and mother regarding transfer plans. Pt agreeable at this time. Pt appears safe for discharge.  [SB]  1845 Case discussed with Dr. Wallace Cullens at [facility] emergency department who is excepting of the patient. [SB]    Clinical Course User Index [  SB] Audrielle Vankuren  A, PA-C                           Medical Decision Making Amount and/or Complexity of Data Reviewed Labs: ordered. Radiology: ordered.  Risk Prescription drug management.   Patient presents to the emergency department with concern for lower abdominal pain onset 4 AM this morning.  Has a history of endometriosis and ovarian torsion.  Sent here by her OB/GYN.  Patient afebrile.  On exam patient with Tenderness to palpation noted to lower abdominal region, right> left.  No acute cardiovascular respiratory exam findings.  Differential diagnosis includes acute cystitis, ovarian torsion, endometriosis, appendicitis, diverticulitis.   Labs:  I ordered, and personally interpreted labs.  The pertinent results include:  ECG unremarkable. Lipase undetectable. CBC with elevated WBC at 13.4 otherwise unremarkable. CMP with slightly decreased potassium at 3.4, otherwise unremarkable. Urinalysis without signs for UTI at this time.  Imaging: I ordered imaging studies including ultrasound pelvis, transvaginal, Doppler Unable to obtain ultrasound at this facility as ultrasound is down at this time.  Patient will be transported to [facility] for ultrasound.  Medications:  I ordered medication including Dilaudid, Phenergan, IV fluids, Zofran for symptom management. Reevaluation of the patient after these medicines and interventions, I reevaluated the patient and found that they have improved I have reviewed the patients home medicines and have made adjustments as needed   Disposition: I feel that the patient would benefit from Transfer to [facility] emergency department to have ultrasound completed to rule out ovarian torsion or endometriosis.  Case discussed with attending, Dr. Wallace Cullens who accepted the patient in transfer at [facility].  Discussed with patient plans for transfer.  Answered all available questions.  Patient agreeable to transfer at this time.  Patient will be transported via POV with her  mother driving her to [facility] emergency department.  This chart was dictated using voice recognition software, Dragon. Despite the best efforts of this provider to proofread and correct errors, errors may still occur which can change documentation meaning.   Final Clinical Impression(s) / ED Diagnoses Final diagnoses:  Lower abdominal pain    Rx / DC Orders ED Discharge Orders     None         Kaylynne Andres A, PA-C 03/05/22 1937    Glyn Ade, MD 03/06/22 [phone]

## 2022-03-05 NOTE — ED Triage Notes (Signed)
Patient reports she is unsure if she is having an endo flare or pain from ovarian cysts. Patient reports she was seen on 8/18 for the same but she called her OB office and was told to come to the ER to R/o Ovarian torision as she has a hx of that as well. Patient reports lower abdominal pain that goes to the back right side and goes into the hip and shoots down her leg. Endorses nausea and emesis.

## 2022-03-05 NOTE — ED Notes (Signed)
Pt at U/S

## 2022-03-06 ENCOUNTER — Other Ambulatory Visit (HOSPITAL_BASED_OUTPATIENT_CLINIC_OR_DEPARTMENT_OTHER): Payer: Self-pay

## 2022-03-06 ENCOUNTER — Emergency Department (HOSPITAL_COMMUNITY): Payer: BC Managed Care – PPO

## 2022-03-06 DIAGNOSIS — R1031 Right lower quadrant pain: Secondary | ICD-10-CM | POA: Diagnosis not present

## 2022-03-06 MED ORDER — TRAMADOL HCL 50 MG PO TABS: 50.0000 mg | ORAL_TABLET | Freq: Four times a day (QID) | ORAL | 0 refills | Status: AC | PRN

## 2022-03-06 MED ORDER — PROCHLORPERAZINE 25 MG RE SUPP: 25.0000 mg | Freq: Two times a day (BID) | RECTAL | 0 refills | Status: AC | PRN

## 2022-03-06 MED ORDER — PROCHLORPERAZINE 25 MG RE SUPP
25.0000 mg | Freq: Two times a day (BID) | RECTAL | 0 refills | Status: DC | PRN
  Filled 2022-03-06: qty 12, 6d supply, fill #0

## 2022-03-06 MED ORDER — IOHEXOL 300 MG/ML  SOLN
100.0000 mL | Freq: Once | INTRAMUSCULAR | Status: AC | PRN
  Administered 2022-03-06: 100 mL via INTRAVENOUS

## 2022-03-06 MED ORDER — TRAMADOL HCL 50 MG PO TABS
50.0000 mg | ORAL_TABLET | Freq: Four times a day (QID) | ORAL | 0 refills | Status: DC | PRN
  Filled 2022-03-06: qty 12, 3d supply, fill #0

## 2022-03-06 NOTE — ED Notes (Signed)
Patient verbalizes understanding of d/c instructions. Opportunities for questions and answers were provided. Pt d/c from ED and ambulated to lobby with family member.

## 2022-03-11 DIAGNOSIS — F4310 Post-traumatic stress disorder, unspecified: Secondary | ICD-10-CM | POA: Diagnosis not present

## 2022-03-12 DIAGNOSIS — N951 Menopausal and female climacteric states: Secondary | ICD-10-CM | POA: Diagnosis not present

## 2022-03-12 DIAGNOSIS — F419 Anxiety disorder, unspecified: Secondary | ICD-10-CM | POA: Diagnosis not present

## 2022-03-12 DIAGNOSIS — N809 Endometriosis, unspecified: Secondary | ICD-10-CM | POA: Diagnosis not present

## 2022-03-12 DIAGNOSIS — R109 Unspecified abdominal pain: Secondary | ICD-10-CM | POA: Diagnosis not present

## 2022-03-12 DIAGNOSIS — M6289 Other specified disorders of muscle: Secondary | ICD-10-CM | POA: Diagnosis not present

## 2022-03-26 DIAGNOSIS — F4310 Post-traumatic stress disorder, unspecified: Secondary | ICD-10-CM | POA: Diagnosis not present

## 2022-04-03 DIAGNOSIS — M5451 Vertebrogenic low back pain: Secondary | ICD-10-CM | POA: Diagnosis not present

## 2022-04-09 DIAGNOSIS — R102 Pelvic and perineal pain: Secondary | ICD-10-CM | POA: Diagnosis not present

## 2022-04-09 DIAGNOSIS — M6289 Other specified disorders of muscle: Secondary | ICD-10-CM | POA: Diagnosis not present

## 2022-04-09 DIAGNOSIS — N809 Endometriosis, unspecified: Secondary | ICD-10-CM | POA: Diagnosis not present

## 2022-04-11 DIAGNOSIS — M545 Low back pain, unspecified: Secondary | ICD-10-CM | POA: Diagnosis not present

## 2022-04-15 DIAGNOSIS — M791 Myalgia, unspecified site: Secondary | ICD-10-CM | POA: Diagnosis not present

## 2022-04-15 DIAGNOSIS — G43719 Chronic migraine without aura, intractable, without status migrainosus: Secondary | ICD-10-CM | POA: Diagnosis not present

## 2022-04-15 DIAGNOSIS — G518 Other disorders of facial nerve: Secondary | ICD-10-CM | POA: Diagnosis not present

## 2022-04-15 DIAGNOSIS — M542 Cervicalgia: Secondary | ICD-10-CM | POA: Diagnosis not present

## 2022-04-23 DIAGNOSIS — F4310 Post-traumatic stress disorder, unspecified: Secondary | ICD-10-CM | POA: Diagnosis not present

## 2022-05-05 DIAGNOSIS — F419 Anxiety disorder, unspecified: Secondary | ICD-10-CM | POA: Diagnosis not present

## 2022-05-06 DIAGNOSIS — F4310 Post-traumatic stress disorder, unspecified: Secondary | ICD-10-CM | POA: Diagnosis not present

## 2022-05-12 ENCOUNTER — Other Ambulatory Visit: Payer: Self-pay | Admitting: Orthopedic Surgery

## 2022-05-12 DIAGNOSIS — M533 Sacrococcygeal disorders, not elsewhere classified: Secondary | ICD-10-CM | POA: Diagnosis not present

## 2022-05-12 DIAGNOSIS — M545 Low back pain, unspecified: Secondary | ICD-10-CM

## 2022-05-20 ENCOUNTER — Other Ambulatory Visit: Payer: BC Managed Care – PPO

## 2022-05-20 DIAGNOSIS — E288 Other ovarian dysfunction: Secondary | ICD-10-CM | POA: Diagnosis not present

## 2022-06-01 DIAGNOSIS — Z03818 Encounter for observation for suspected exposure to other biological agents ruled out: Secondary | ICD-10-CM | POA: Diagnosis not present

## 2022-06-01 DIAGNOSIS — R062 Wheezing: Secondary | ICD-10-CM | POA: Diagnosis not present

## 2022-06-01 DIAGNOSIS — B349 Viral infection, unspecified: Secondary | ICD-10-CM | POA: Diagnosis not present

## 2022-06-01 DIAGNOSIS — R509 Fever, unspecified: Secondary | ICD-10-CM | POA: Diagnosis not present

## 2022-06-01 DIAGNOSIS — R0981 Nasal congestion: Secondary | ICD-10-CM | POA: Diagnosis not present

## 2022-06-01 DIAGNOSIS — J029 Acute pharyngitis, unspecified: Secondary | ICD-10-CM | POA: Diagnosis not present

## 2022-06-08 DIAGNOSIS — N809 Endometriosis, unspecified: Secondary | ICD-10-CM | POA: Diagnosis not present

## 2022-06-08 DIAGNOSIS — Z3183 Encounter for assisted reproductive fertility procedure cycle: Secondary | ICD-10-CM | POA: Diagnosis not present

## 2022-06-09 DIAGNOSIS — Z3183 Encounter for assisted reproductive fertility procedure cycle: Secondary | ICD-10-CM | POA: Diagnosis not present

## 2022-06-15 DIAGNOSIS — Z319 Encounter for procreative management, unspecified: Secondary | ICD-10-CM | POA: Diagnosis not present

## 2022-06-15 DIAGNOSIS — Z3183 Encounter for assisted reproductive fertility procedure cycle: Secondary | ICD-10-CM | POA: Diagnosis not present

## 2022-06-16 DIAGNOSIS — Z3183 Encounter for assisted reproductive fertility procedure cycle: Secondary | ICD-10-CM | POA: Diagnosis not present

## 2022-06-17 DIAGNOSIS — Z3183 Encounter for assisted reproductive fertility procedure cycle: Secondary | ICD-10-CM | POA: Diagnosis not present

## 2022-06-18 DIAGNOSIS — Z319 Encounter for procreative management, unspecified: Secondary | ICD-10-CM | POA: Diagnosis not present

## 2022-06-19 DIAGNOSIS — Z3183 Encounter for assisted reproductive fertility procedure cycle: Secondary | ICD-10-CM | POA: Diagnosis not present

## 2022-06-20 DIAGNOSIS — N809 Endometriosis, unspecified: Secondary | ICD-10-CM | POA: Diagnosis not present

## 2022-06-20 DIAGNOSIS — Z3183 Encounter for assisted reproductive fertility procedure cycle: Secondary | ICD-10-CM | POA: Diagnosis not present

## 2022-06-23 DIAGNOSIS — Z3183 Encounter for assisted reproductive fertility procedure cycle: Secondary | ICD-10-CM | POA: Diagnosis not present

## 2022-06-30 DIAGNOSIS — G518 Other disorders of facial nerve: Secondary | ICD-10-CM | POA: Diagnosis not present

## 2022-06-30 DIAGNOSIS — G43719 Chronic migraine without aura, intractable, without status migrainosus: Secondary | ICD-10-CM | POA: Diagnosis not present

## 2022-06-30 DIAGNOSIS — M791 Myalgia, unspecified site: Secondary | ICD-10-CM | POA: Diagnosis not present

## 2022-06-30 DIAGNOSIS — M542 Cervicalgia: Secondary | ICD-10-CM | POA: Diagnosis not present

## 2022-07-10 DIAGNOSIS — N809 Endometriosis, unspecified: Secondary | ICD-10-CM | POA: Diagnosis not present

## 2022-07-10 DIAGNOSIS — N80352 Endometriosis of the left pelvic sidewall, unspecified depth: Secondary | ICD-10-CM | POA: Diagnosis not present

## 2022-07-10 DIAGNOSIS — Z888 Allergy status to other drugs, medicaments and biological substances status: Secondary | ICD-10-CM | POA: Diagnosis not present

## 2022-07-10 DIAGNOSIS — Z885 Allergy status to narcotic agent status: Secondary | ICD-10-CM | POA: Diagnosis not present

## 2022-07-10 DIAGNOSIS — N994 Postprocedural pelvic peritoneal adhesions: Secondary | ICD-10-CM | POA: Diagnosis not present

## 2022-07-10 DIAGNOSIS — N80372 Deep endometriosis of the left pelvic brim: Secondary | ICD-10-CM | POA: Diagnosis not present

## 2022-07-10 DIAGNOSIS — K219 Gastro-esophageal reflux disease without esophagitis: Secondary | ICD-10-CM | POA: Diagnosis not present

## 2022-07-10 DIAGNOSIS — I10 Essential (primary) hypertension: Secondary | ICD-10-CM | POA: Diagnosis not present

## 2022-07-10 DIAGNOSIS — N80392 Deep endometriosis of the pelvic peritoneum, other specified sites: Secondary | ICD-10-CM | POA: Diagnosis not present

## 2022-07-10 DIAGNOSIS — N83202 Unspecified ovarian cyst, left side: Secondary | ICD-10-CM | POA: Diagnosis not present

## 2022-07-10 DIAGNOSIS — N803C9 Endometriosis of the uterosacral ligament(s), unspecified side, unspecified depth: Secondary | ICD-10-CM | POA: Diagnosis not present

## 2022-07-10 DIAGNOSIS — Z9889 Other specified postprocedural states: Secondary | ICD-10-CM | POA: Diagnosis not present

## 2022-07-10 DIAGNOSIS — N80342 Deep endometriosis of the left pelvic sidewall: Secondary | ICD-10-CM | POA: Diagnosis not present

## 2022-07-10 DIAGNOSIS — J45909 Unspecified asthma, uncomplicated: Secondary | ICD-10-CM | POA: Diagnosis not present

## 2022-07-10 DIAGNOSIS — N83291 Other ovarian cyst, right side: Secondary | ICD-10-CM | POA: Diagnosis not present

## 2022-08-03 ENCOUNTER — Emergency Department (HOSPITAL_BASED_OUTPATIENT_CLINIC_OR_DEPARTMENT_OTHER)
Admission: EM | Admit: 2022-08-03 | Discharge: 2022-08-03 | Disposition: A | Discharge status: Home / Self Care | Payer: Managed Care, Other (non HMO) | Attending: Emergency Medicine | Admitting: Emergency Medicine

## 2022-08-03 ENCOUNTER — Emergency Department (HOSPITAL_BASED_OUTPATIENT_CLINIC_OR_DEPARTMENT_OTHER): Payer: Managed Care, Other (non HMO)

## 2022-08-03 ENCOUNTER — Other Ambulatory Visit: Payer: Self-pay

## 2022-08-03 ENCOUNTER — Encounter (HOSPITAL_BASED_OUTPATIENT_CLINIC_OR_DEPARTMENT_OTHER): Payer: Self-pay

## 2022-08-03 DIAGNOSIS — R109 Unspecified abdominal pain: Secondary | ICD-10-CM | POA: Diagnosis present

## 2022-08-03 DIAGNOSIS — R1030 Lower abdominal pain, unspecified: Secondary | ICD-10-CM | POA: Diagnosis not present

## 2022-08-03 DIAGNOSIS — R739 Hyperglycemia, unspecified: Secondary | ICD-10-CM | POA: Insufficient documentation

## 2022-08-03 LAB — COMPREHENSIVE METABOLIC PANEL
ALT: 14 U/L (ref 0–44)
AST: 14 U/L — ABNORMAL LOW (ref 15–41)
Albumin: 4.4 g/dL (ref 3.5–5.0)
Alkaline Phosphatase: 49 U/L (ref 38–126)
Anion gap: 10 (ref 5–15)
BUN: 10 mg/dL (ref 6–20)
CO2: 20 mmol/L — ABNORMAL LOW (ref 22–32)
Calcium: 9.3 mg/dL (ref 8.9–10.3)
Chloride: 109 mmol/L (ref 98–111)
Creatinine, Ser: 0.97 mg/dL (ref 0.44–1.00)
GFR, Estimated: >60 mL/min (ref >60)
Glucose, Bld: 111 mg/dL — ABNORMAL HIGH (ref 70–99)
Potassium: 3.7 mmol/L (ref 3.5–5.1)
Sodium: 139 mmol/L (ref 135–145)
Total Bilirubin: 0.3 mg/dL (ref 0.3–1.2)
Total Protein: 7.1 g/dL (ref 6.5–8.1)

## 2022-08-03 LAB — URINALYSIS, ROUTINE W REFLEX MICROSCOPIC
Bilirubin Urine: NEGATIVE
Glucose, UA: NEGATIVE mg/dL
Ketones, ur: NEGATIVE mg/dL
Nitrite: NEGATIVE
Protein, ur: NEGATIVE mg/dL
Specific Gravity, Urine: 1.018 (ref 1.005–1.030)
pH: 5.5 (ref 5.0–8.0)

## 2022-08-03 LAB — CBC
HCT: 39.2 % (ref 36.0–46.0)
Hemoglobin: 13.2 g/dL (ref 12.0–15.0)
MCH: 28.9 pg (ref 26.0–34.0)
MCHC: 33.7 g/dL (ref 30.0–36.0)
MCV: 86 fL (ref 80.0–100.0)
Platelets: 343 10*3/uL (ref 150–400)
RBC: 4.56 MIL/uL (ref 3.87–5.11)
RDW: 12.7 % (ref 11.5–15.5)
WBC: 9.5 10*3/uL (ref 4.0–10.5)
nRBC: 0 % (ref 0.0–0.2)

## 2022-08-03 LAB — PREGNANCY, URINE: Preg Test, Ur: NEGATIVE

## 2022-08-03 LAB — LIPASE, BLOOD: Lipase: 13 U/L (ref 11–51)

## 2022-08-03 MED ORDER — IOHEXOL 300 MG/ML  SOLN
100.0000 mL | Freq: Once | INTRAMUSCULAR | Status: AC | PRN
Start: 2022-08-03 — End: 2022-08-03
  Administered 2022-08-03: 85 mL via INTRAVENOUS

## 2022-08-03 MED ORDER — OXYCODONE-ACETAMINOPHEN 5-325 MG PO TABS: 1.0000 | ORAL_TABLET | Freq: Four times a day (QID) | ORAL | 0 refills | Status: DC | PRN

## 2022-08-03 MED ORDER — HYDROMORPHONE HCL 1 MG/ML IJ SOLN
1.0000 mg | Freq: Once | INTRAMUSCULAR | Status: AC
  Administered 2022-08-03: 1 mg via INTRAVENOUS
  Filled 2022-08-03: qty 1

## 2022-08-03 MED ORDER — SODIUM CHLORIDE 0.9 % IV BOLUS
1000.0000 mL | Freq: Once | INTRAVENOUS | Status: AC
  Administered 2022-08-03: 1000 mL via INTRAVENOUS

## 2022-08-03 MED ORDER — ONDANSETRON HCL 4 MG PO TABS: 4.0000 mg | ORAL_TABLET | Freq: Four times a day (QID) | ORAL | 0 refills | Status: DC

## 2022-08-03 MED ORDER — KETOROLAC TROMETHAMINE 30 MG/ML IJ SOLN
30.0000 mg | Freq: Once | INTRAMUSCULAR | Status: AC
  Administered 2022-08-03: 30 mg via INTRAVENOUS
  Filled 2022-08-03: qty 1

## 2022-08-03 MED ORDER — ONDANSETRON HCL 4 MG/2ML IJ SOLN
4.0000 mg | Freq: Once | INTRAMUSCULAR | Status: AC
  Administered 2022-08-03: 4 mg via INTRAVENOUS
  Filled 2022-08-03: qty 2

## 2022-08-03 NOTE — ED Provider Notes (Signed)
[facility] Provider Note   CSN: [id number] Arrival date & time: 08/03/22  1241     History  Chief Complaint  Patient presents with   Abdominal Pain   HPI [name] [name] is a 37 y.o. female with history of endometriosis status post 2 surgical interventions, colitis status post bowel resection, diverticulitis presenting for abdominal pain.  Started Thursday.  Located in the lower abdomen.  States she did have endoscopic surgery for endometriosis on December 29 of last year.  Had a associated postsurgical superficial infection requiring course of Bactrim.  Infection improved and she has finished her course of Bactrim.  Endorsing associated vomiting and diarrhea.  Vomiting is nonbilious and nonbloody.  Diarrhea is also nonbloody.  Bowel resection for colitis was in 2018.  States she has been taking 600 to 800 mg every 12 hours of ibuprofen since her surgery.  States she did have a fever on Friday but not today. No urinary symptoms.    Abdominal Pain  Abdominal Pain  Abdominal Pain      Home Medications Prior to Admission medications   Medication Sig Start Date End Date Taking? Authorizing Provider  ondansetron (ZOFRAN) 4 MG tablet Take 1 tablet (4 mg total) by mouth every 6 (six) hours. 08/03/22  Yes Harriet Pho, PA-C  oxyCODONE-acetaminophen (PERCOCET/ROXICET) 5-325 MG tablet Take 1 tablet by mouth every 6 (six) hours as needed for severe pain. 08/03/22  Yes Harriet Pho, PA-C  acetaminophen (TYLENOL) 500 MG tablet Take 1,000 mg by mouth every 6 (six) hours as needed for headache.    [provider]  albuterol (PROVENTIL) (5 MG/ML) 0.5% nebulizer solution Inhale 1/2 vial (2.5 mg total) via nebulizer every 6 (six) hours as needed for wheezing or shortness of breath. 11/02/20   Rancour, Annie Main, MD  azelastine (ASTELIN) 0.1 % nasal spray Spray 1-2 puffs in each nostril twice a day 11/29/20     butorphanol (STADOL) 10 MG/ML nasal  spray Use 1 spray in one nostril as needed every 6 hours for 30 days 07/16/21     cetirizine (ZYRTEC) 10 MG tablet Take 10 mg by mouth daily. Patient not taking: Reported on 01/03/2021    [provider]  clonazePAM (KLONOPIN) 0.5 MG tablet Take 0.25 mg by mouth at bedtime as needed for anxiety.    [provider]  cyclobenzaprine (FLEXERIL) 10 MG tablet Take 1 tablet (10 mg total) by mouth 3 (three) times daily as needed for muscle spasms. 03/31/19   Jaclyn Prime, Collene Leyden, PA-C  diphenhydrAMINE (BENADRYL) 50 MG tablet Take 50 mg by mouth daily as needed for itching.    [provider]  EPINEPHrine 0.3 mg/0.3 mL IJ SOAJ injection Inject 0.3 mg into the muscle as needed for anaphylaxis. Patient taking differently: Inject 0.3 mg into the muscle once as needed for anaphylaxis. 10/15/20   Orpah Greek, MD  escitalopram (LEXAPRO) 20 MG tablet Take 20 mg by mouth daily.    [provider]  famotidine (PEPCID) 20 MG tablet Take 1 tablet (20 mg total) by mouth 2 (two) times daily for 7 days. 10/19/20 10/26/20  Donne Hazel, MD  fluticasone (FLONASE) 50 MCG/ACT nasal spray Place 2 sprays into both nostrils daily. 09/23/17   Brunetta Jeans, PA-C  fluticasone furoate-vilanterol (BREO ELLIPTA) 200-25 MCG/ACT AEPB Inhale into the lungs once daily as instructed. 05/02/21     fluticasone-salmeterol (ADVAIR HFA) 115-21 MCG/ACT inhaler Inhale 2 puffs by mouth into the lungs  twice a day 10/23/20     hydrOXYzine (ATARAX) 10 MG tablet Take 1 tablet (10 mg total) by mouth every 8 (eight) hours as needed. 06/13/21     ibuprofen (ADVIL) 200 MG tablet Take 800 mg by mouth every 6 (six) hours as needed for headache.    [provider]  ipratropium (ATROVENT) 0.03 % nasal spray 2 sprays per nostril 2-3 times daily as needed for congestion and post-nasal drainage 10/09/20   Noe Gens, PA-C  labetalol (NORMODYNE) 200 MG tablet Take 200 mg by mouth 2 (two) times daily.     [provider]  Lasmiditan Succinate (REYVOW) 100 MG TABS Take 100 mg by mouth as needed. Patient not taking: Reported on 01/03/2021 08/11/19   Jaclyn Prime, Collene Leyden, PA-C  levalbuterol [facility] HFA) 45 MCG/ACT inhaler Inhale 2 puffs into the lungs every 4 (four) hours as needed for wheezing.    [provider]  levocetirizine (XYZAL) 5 MG tablet Take 1 tablet by mouth every evening. 05/16/21     metFORMIN (GLUCOPHAGE) 500 MG tablet Take 1,000 mg by mouth 2 (two) times daily. 01/26/19   [provider]  methocarbamol (ROBAXIN) 750 MG tablet Take 1-2 tablets (750-1,500 mg total) by mouth every 8 (eight) hours as needed for muscle spasms and pain. 12/26/20   Lossie Faes, MD  montelukast (SINGULAIR) 10 MG tablet Take 1 tablet (10 mg total) by mouth daily. 11/29/20     naproxen (NAPROSYN) 500 MG tablet Take 1 tablet (500 mg total) by mouth 2 (two) times daily. 10/13/21   Kommor, [place], MD  pantoprazole (PROTONIX) 20 MG tablet Take 1 tablet (20 mg total) by mouth daily. Patient taking differently: Take 20 mg by mouth at bedtime. 05/04/15   Nona Dell, PA-C  pantoprazole (PROTONIX) 20 MG tablet TAKE 1 TABLET BY MOUTH ONCE DAILY (NEEDS OFFICE VISIT) 06/13/21     prochlorperazine (COMPAZINE) 25 MG suppository Place 1 suppository (25 mg total) rectally every 12 (twelve) hours as needed for nausea or vomiting. 03/06/22   Cardama, Grayce Sessions, MD  promethazine (PHENERGAN) 25 MG tablet Take 1 tablet (25 mg total) by mouth every 6 (six) hours as needed for nausea or vomiting. 02/27/22   Fransico Meadow, PA-C  Rimegepant Sulfate (NURTEC) 75 MG TBDP Take 75 mg by mouth as needed (migraine). Patient taking differently: Take 75 mg by mouth daily as needed (migraine). 03/31/19   Teague Carlis Abbott, Collene Leyden, PA-C  TOSYMRA 10 MG/ACT SOLN Inject 10 mg into the skin daily as needed (migraine). 09/04/20   [provider]      Allergies    Mushroom extract complex, Tylenol with  codeine #3 [acetaminophen-codeine], Betadine [povidone iodine], and Povidone-iodine    Review of Systems   Review of Systems  Gastrointestinal:  Positive for abdominal pain.    Physical Exam   Vitals:   08/03/22 1251 08/03/22 1439  BP:  (!) 144/75  Pulse:  80  Resp:  16  Temp:    SpO2: 97% 97%    CONSTITUTIONAL:  well-appearing, NAD NEURO:  Alert and oriented x 3, CN 3-12 grossly intact EYES:  eyes equal and reactive ENT/NECK:  Supple, no stridor  CARDIO:  regular rate and rhythm, appears well-perfused  PULM:  No respiratory distress, CTAB GI/GU:  non-distended, soft, lower abdominal tenderness MSK/SPINE:  No gross deformities, no edema, moves all extremities  SKIN:  no rash, atraumatic   *Additional and/or pertinent findings included in MDM below  ED Results / Procedures / Treatments   Labs (all labs ordered are listed, but only abnormal results are displayed) Labs Reviewed  COMPREHENSIVE METABOLIC PANEL - Abnormal; Notable for the following components:      Result Value   CO2 20 (*)    Glucose, Bld 111 (*)    AST 14 (*)    All other components within normal limits  URINALYSIS, ROUTINE W REFLEX MICROSCOPIC - Abnormal; Notable for the following components:   APPearance HAZY (*)    Hgb urine dipstick LARGE (*)    Leukocytes,Ua SMALL (*)    Bacteria, UA RARE (*)    All other components within normal limits  LIPASE, BLOOD  CBC  PREGNANCY, URINE    EKG None  Radiology CT Abdomen Pelvis W Contrast  Result Date: 08/03/2022 CLINICAL DATA:  Right lower quadrant abdominal pain EXAM: CT ABDOMEN AND PELVIS WITH CONTRAST TECHNIQUE: Multidetector CT imaging of the abdomen and pelvis was performed using the standard protocol following bolus administration of intravenous contrast. RADIATION DOSE REDUCTION: This exam was performed according to the departmental dose-optimization program which includes automated exposure control, adjustment of the mA and/or kV according  to patient size and/or use of iterative reconstruction technique. CONTRAST:  31mL OMNIPAQUE IOHEXOL 300 MG/ML  SOLN COMPARISON:  CT March 06, 2022 and MRI February 25, 2022 FINDINGS: Lower chest: No acute abnormality. Hepatobiliary: No suspicious hepatic lesion. Gallbladder surgically absent. No biliary ductal dilation. Pancreas: No pancreatic ductal dilation or evidence of acute inflammation. Spleen: No splenomegaly. Adrenals/Urinary Tract: Bilateral adrenal glands appear normal. No hydronephrosis. Kidneys demonstrate symmetric enhancement. Urinary bladder is unremarkable for degree of distension. Stomach/Bowel: Low-density submucosal fibrofatty infiltration along the gastric antrum/pylorus for instance on image 31/2. No pathologic dilation of small or large bowel. Appendix is not confidently identified however there is no pericecal inflammation. No evidence of acute bowel inflammation. Prior partial sigmoidectomy with rectosigmoid anastomotic sutures in the midline pelvis. Scattered colonic diverticulosis without findings of acute diverticulitis. Vascular/Lymphatic: Normal caliber abdominal aorta. Smooth IVC contours. No pathologically enlarged abdominal or pelvic lymph nodes. Reproductive: Uterus and bilateral adnexa are unremarkable in CT appearance for reproductive age female. Other: Trace pelvic free fluid is within physiologic normal limits. Musculoskeletal: Serpiginous sclerosis in the left femoral head on image 56/8 is compatible with avascular necrosis is similar prior. 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 IMPRESSION: 1. No acute abnormality in the abdomen or pelvis. 2. Prior partial colectomy with rectosigmoid anastomotic sutures in the midline pelvis. 3. Scattered colonic diverticulosis without findings of acute diverticulitis. 4. Submucosal fibrofatty infiltration  along the gastric antrum/pylorus, which can be seen in the setting of chronic gastritis. Electronically Signed   By: Maudry Mayhew M.D.   On: 08/03/2022 14:19    Procedures Procedures    Medications Ordered in ED Medications  ketorolac (TORADOL) 30 MG/ML injection 30 mg (has no administration in time range)  ondansetron (ZOFRAN) injection 4 mg (4 mg Intravenous Given 08/03/22 1350)  sodium chloride 0.9 % bolus 1,000 mL (0 mLs Intravenous Stopped 08/03/22 1525)  HYDROmorphone (DILAUDID) injection 1 mg (1 mg Intravenous Given 08/03/22 1350)  iohexol (OMNIPAQUE) 300 MG/ML solution 100 mL (85 mLs Intravenous Contrast Given 08/03/22 1356)    ED Course/ Medical Decision Making/ A&P  Medical Decision Making Amount and/or Complexity of Data Reviewed Labs: ordered. Radiology: ordered.  Risk Prescription drug management.   Initial Impression and Ddx 37 year old female who is well-appearing and hemodynamically stable presenting for abdominal pain.  Physical exam notable for lower abdominal pain.  Differential diagnosis for this complaint includes appendicitis, colitis, diverticulitis, ectopic pregnancy, ovarian torsion, postoperative pain Patient PMH that increases complexity of ED encounter: Endometriosis with recent surgical intervention, colitis, and diverticulitis  Interpretation of Diagnostics I independent reviewed and interpreted the labs as followed: Pyuria, hematuria, hyperglycemia  - I independently visualized the following imaging with scope of interpretation limited to determining acute life threatening conditions related to emergency care: CT abdomen pelvis, which revealed no acute findings  Patient Reassessment and Ultimate Disposition/Management Treated pain with Dilaudid and Zofran for nausea.  Upon reevaluation patient stated that both nausea and pain had improved.  CT scan did not reveal any acute findings that could be related to her symptoms  today.  Considered ectopic but unlikely given negative pregnancy.  Also considered ovarian torsion but unlikely given unremarkable CT scan.  Suspect pain could be related to ongoing postoperative pain.  Patient mentioned that she is scheduled for follow-up appointment with her surgeon on Thursday.  Advised that she go to this appointment and discuss her ongoing lower abdominal pain since her surgery.  Sent a few tablets of Percocet to her pharmacy for acute pain.  Sent Zofran to her pharmacy as well for nausea at home. Discussed return precautions.   Also considered UTI given pyuria and hematuria but did not treat given patient had no symptoms.  Advised her to seek out treatment if she did develop urinary symptoms.  management required discussion with the following services or consulting groups:  None  Complexity of Problems Addressed Acute complicated illness or Injury  Additional Data Reviewed and Analyzed Further history obtained from: Prior ED visit notes  Patient Encounter Risk Assessment Prescriptions         Final Clinical Impression(s) / ED Diagnoses Final diagnoses:  Lower abdominal pain    Rx / DC Orders ED Discharge Orders          Ordered    ondansetron (ZOFRAN) 4 MG tablet  Every 6 hours        08/03/22 1526    oxyCODONE-acetaminophen (PERCOCET/ROXICET) 5-325 MG tablet  Every 6 hours PRN        08/03/22 1527              Gareth Eagle, PA-C 08/03/22 1528    Glynn Octave, MD 08/03/22 1810

## 2022-08-03 NOTE — ED Triage Notes (Signed)
Patient arrives with complaints of abdomina pain, nausea, and diarrhea x1 week. Patient did recently have an outpatient surgery done for endometriosis.  Patient called her surgeon who referred her to the ED for further evaluation. Also has history of diverticulitis

## 2022-08-03 NOTE — Discharge Instructions (Signed)
Evaluation for your lower abdominal pain was overall reassuring.  CT scan was negative for an acute process that may be contributing to your symptoms.  Recommend that you do follow-up with your surgeon as your pain could be related to ongoing postoperative pain.  I have sent a few tablets of Percocet to your pharmacy along with Zofran for pain and nausea respectively.  If you have worsening abdominal pain, bloody stools bloody urine or bloody vomit or any other concerning symptom please return to the emergency department for further evaluation.

## 2022-08-27 ENCOUNTER — Other Ambulatory Visit: Payer: Self-pay | Admitting: Orthopedic Surgery

## 2022-08-27 DIAGNOSIS — M5441 Lumbago with sciatica, right side: Secondary | ICD-10-CM

## 2022-09-25 ENCOUNTER — Emergency Department (HOSPITAL_BASED_OUTPATIENT_CLINIC_OR_DEPARTMENT_OTHER)
Admission: EM | Admit: 2022-09-25 | Discharge: 2022-09-25 | Disposition: A | Discharge status: Home / Self Care | Payer: Managed Care, Other (non HMO) | Attending: Emergency Medicine | Admitting: Emergency Medicine

## 2022-09-25 ENCOUNTER — Emergency Department (HOSPITAL_BASED_OUTPATIENT_CLINIC_OR_DEPARTMENT_OTHER): Payer: Managed Care, Other (non HMO)

## 2022-09-25 ENCOUNTER — Encounter (HOSPITAL_BASED_OUTPATIENT_CLINIC_OR_DEPARTMENT_OTHER): Payer: Self-pay | Admitting: Emergency Medicine

## 2022-09-25 ENCOUNTER — Other Ambulatory Visit: Payer: Self-pay

## 2022-09-25 DIAGNOSIS — R112 Nausea with vomiting, unspecified: Secondary | ICD-10-CM | POA: Diagnosis not present

## 2022-09-25 DIAGNOSIS — R1031 Right lower quadrant pain: Secondary | ICD-10-CM | POA: Diagnosis present

## 2022-09-25 DIAGNOSIS — D72829 Elevated white blood cell count, unspecified: Secondary | ICD-10-CM | POA: Insufficient documentation

## 2022-09-25 DIAGNOSIS — R7401 Elevation of levels of liver transaminase levels: Secondary | ICD-10-CM | POA: Diagnosis not present

## 2022-09-25 LAB — COMPREHENSIVE METABOLIC PANEL
ALT: 86 U/L — ABNORMAL HIGH (ref 0–44)
AST: 46 U/L — ABNORMAL HIGH (ref 15–41)
Albumin: 4.7 g/dL (ref 3.5–5.0)
Alkaline Phosphatase: 57 U/L (ref 38–126)
Anion gap: 12 (ref 5–15)
BUN: 16 mg/dL (ref 6–20)
CO2: 20 mmol/L — ABNORMAL LOW (ref 22–32)
Calcium: 9.7 mg/dL (ref 8.9–10.3)
Chloride: 108 mmol/L (ref 98–111)
Creatinine, Ser: 0.99 mg/dL (ref 0.44–1.00)
GFR, Estimated: >60 mL/min (ref >60)
Glucose, Bld: 98 mg/dL (ref 70–99)
Potassium: 3.9 mmol/L (ref 3.5–5.1)
Sodium: 140 mmol/L (ref 135–145)
Total Bilirubin: 0.7 mg/dL (ref 0.3–1.2)
Total Protein: 7.4 g/dL (ref 6.5–8.1)

## 2022-09-25 LAB — LACTIC ACID, PLASMA: Lactic Acid, Venous: 0.8 mmol/L (ref 0.5–1.9)

## 2022-09-25 LAB — CBC
HCT: 42.6 % (ref 36.0–46.0)
Hemoglobin: 14.4 g/dL (ref 12.0–15.0)
MCH: 27.5 pg (ref 26.0–34.0)
MCHC: 33.8 g/dL (ref 30.0–36.0)
MCV: 81.5 fL (ref 80.0–100.0)
Platelets: 420 10*3/uL — ABNORMAL HIGH (ref 150–400)
RBC: 5.23 MIL/uL — ABNORMAL HIGH (ref 3.87–5.11)
RDW: 12.5 % (ref 11.5–15.5)
WBC: 11.7 10*3/uL — ABNORMAL HIGH (ref 4.0–10.5)
nRBC: 0 % (ref 0.0–0.2)

## 2022-09-25 LAB — URINALYSIS, ROUTINE W REFLEX MICROSCOPIC
Bilirubin Urine: NEGATIVE
Glucose, UA: NEGATIVE mg/dL
Hgb urine dipstick: NEGATIVE
Ketones, ur: 40 mg/dL — AB
Leukocytes,Ua: NEGATIVE
Nitrite: NEGATIVE
Protein, ur: NEGATIVE mg/dL
Specific Gravity, Urine: 1.02 (ref 1.005–1.030)
pH: 6 (ref 5.0–8.0)

## 2022-09-25 LAB — LIPASE, BLOOD: Lipase: 16 U/L (ref 11–51)

## 2022-09-25 LAB — PREGNANCY, URINE: Preg Test, Ur: NEGATIVE

## 2022-09-25 MED ORDER — PROMETHAZINE HCL 25 MG/ML IJ SOLN
INTRAMUSCULAR | Status: AC
  Filled 2022-09-25: qty 1

## 2022-09-25 MED ORDER — KETOROLAC TROMETHAMINE 10 MG PO TABS: 10.0000 mg | ORAL_TABLET | Freq: Four times a day (QID) | ORAL | 0 refills | Status: DC | PRN

## 2022-09-25 MED ORDER — MORPHINE SULFATE (PF) 2 MG/ML IV SOLN
2.0000 mg | Freq: Once | INTRAVENOUS | Status: AC
  Administered 2022-09-25: 2 mg via INTRAVENOUS
  Filled 2022-09-25: qty 1

## 2022-09-25 MED ORDER — PROMETHAZINE HCL 25 MG RE SUPP: 25.0000 mg | Freq: Four times a day (QID) | RECTAL | 0 refills | Status: AC | PRN

## 2022-09-25 MED ORDER — SODIUM CHLORIDE 0.9 % IV SOLN
12.5000 mg | Freq: Once | INTRAVENOUS | Status: AC
  Administered 2022-09-25: 12.5 mg via INTRAVENOUS
  Filled 2022-09-25: qty 0.5

## 2022-09-25 MED ORDER — FENTANYL CITRATE PF 50 MCG/ML IJ SOSY
100.0000 ug | PREFILLED_SYRINGE | Freq: Once | INTRAMUSCULAR | Status: AC
  Administered 2022-09-25: 100 ug via INTRAVENOUS
  Filled 2022-09-25: qty 2

## 2022-09-25 MED ORDER — IOHEXOL 300 MG/ML  SOLN
100.0000 mL | Freq: Once | INTRAMUSCULAR | Status: AC | PRN
  Administered 2022-09-25: 100 mL via INTRAVENOUS

## 2022-09-25 MED ORDER — SODIUM CHLORIDE 0.9 % IV BOLUS
1000.0000 mL | Freq: Once | INTRAVENOUS | Status: AC
  Administered 2022-09-25: 1000 mL via INTRAVENOUS

## 2022-09-25 NOTE — Discharge Instructions (Addendum)
Please follow-up with your primary care doctor, and your gynecologist for further evaluation.  Today showed that your adnexa and uterus were within normal limits.  There is no acute findings other than the Jestine Bicknell amount of stool in your rectum.  If you struggle with constipation you can use an enema over-the-counter, or take some MiraLAX.  If you have worsening pain, intractable nausea or vomiting please return to the ER.  You can also take Toradol for your pain, however do not take any kind of other NSAIDs including ibuprofen or naproxen for this.  You can also use a heating pad to help with the pain.

## 2022-09-25 NOTE — ED Provider Notes (Signed)
[facility] Provider Note   CSN: [id number] Arrival date & time: 09/25/22  1457     History  Chief Complaint  Patient presents with   Abdominal Pain   Emesis    [name] [name] is a 37 y.o. female, history of endometriosis, ulcerative colitis, who presents to the ED secondary to intractable right lower quadrant pain radiating to the right back and right flank for the last 4 days.  She states that it is sharp in nature, and radiates to the back, and has been associated with nausea and vomiting for the last day.  She states that it feels like her past cyst pain, but she typically does not have the severe of vomiting.  Denies any marijuana use.  No diarrhea, is able to pass gas.  Currently on contraceptives.  History of bowel resection, cleanout for her endometriosis.  Denies any vaginal discharge or urinary symptoms.     Home Medications Prior to Admission medications   Medication Sig Start Date End Date Taking? Authorizing Provider  ketorolac (TORADOL) 10 MG tablet Take 1 tablet (10 mg total) by mouth every 6 (six) hours as needed. 09/25/22  Yes Leonidus Rowand L, PA  promethazine (PHENERGAN) 25 MG suppository Place 1 suppository (25 mg total) rectally every 6 (six) hours as needed for nausea or vomiting. 09/25/22  Yes Angeleigh Chiasson L, PA  acetaminophen (TYLENOL) 500 MG tablet Take 1,000 mg by mouth every 6 (six) hours as needed for headache.    [provider]  albuterol (PROVENTIL) (5 MG/ML) 0.5% nebulizer solution Inhale 1/2 vial (2.5 mg total) via nebulizer every 6 (six) hours as needed for wheezing or shortness of breath. 11/02/20   Rancour, Annie Main, MD  azelastine (ASTELIN) 0.1 % nasal spray Spray 1-2 puffs in each nostril twice a day 11/29/20     butorphanol (STADOL) 10 MG/ML nasal spray Use 1 spray in one nostril as needed every 6 hours for 30 days 07/16/21     cetirizine (ZYRTEC) 10 MG tablet Take 10 mg by mouth daily. Patient not  taking: Reported on 01/03/2021    [provider]  clonazePAM (KLONOPIN) 0.5 MG tablet Take 0.25 mg by mouth at bedtime as needed for anxiety.    [provider]  cyclobenzaprine (FLEXERIL) 10 MG tablet Take 1 tablet (10 mg total) by mouth 3 (three) times daily as needed for muscle spasms. 03/31/19   Jaclyn Prime, Collene Leyden, PA-C  diphenhydrAMINE (BENADRYL) 50 MG tablet Take 50 mg by mouth daily as needed for itching.    [provider]  EPINEPHrine 0.3 mg/0.3 mL IJ SOAJ injection Inject 0.3 mg into the muscle as needed for anaphylaxis. Patient taking differently: Inject 0.3 mg into the muscle once as needed for anaphylaxis. 10/15/20   Orpah Greek, MD  escitalopram (LEXAPRO) 20 MG tablet Take 20 mg by mouth daily.    [provider]  famotidine (PEPCID) 20 MG tablet Take 1 tablet (20 mg total) by mouth 2 (two) times daily for 7 days. 10/19/20 10/26/20  Donne Hazel, MD  fluticasone (FLONASE) 50 MCG/ACT nasal spray Place 2 sprays into both nostrils daily. 09/23/17   Brunetta Jeans, PA-C  fluticasone furoate-vilanterol (BREO ELLIPTA) 200-25 MCG/ACT AEPB Inhale into the lungs once daily as instructed. 05/02/21     fluticasone-salmeterol (ADVAIR HFA) 115-21 MCG/ACT inhaler Inhale 2 puffs by mouth into the lungs twice a day 10/23/20     hydrOXYzine (ATARAX) 10 MG tablet Take 1 tablet (  10 mg total) by mouth every 8 (eight) hours as needed. 06/13/21     ibuprofen (ADVIL) 200 MG tablet Take 800 mg by mouth every 6 (six) hours as needed for headache.    [provider]  ipratropium (ATROVENT) 0.03 % nasal spray 2 sprays per nostril 2-3 times daily as needed for congestion and post-nasal drainage 10/09/20   Noe Gens, PA-C  labetalol (NORMODYNE) 200 MG tablet Take 200 mg by mouth 2 (two) times daily.    [provider]  Lasmiditan Succinate (REYVOW) 100 MG TABS Take 100 mg by mouth as needed. Patient not taking: Reported on 01/03/2021 08/11/19    Jaclyn Prime, Collene Leyden, PA-C  levalbuterol [facility] HFA) 45 MCG/ACT inhaler Inhale 2 puffs into the lungs every 4 (four) hours as needed for wheezing.    [provider]  levocetirizine (XYZAL) 5 MG tablet Take 1 tablet by mouth every evening. 05/16/21     metFORMIN (GLUCOPHAGE) 500 MG tablet Take 1,000 mg by mouth 2 (two) times daily. 01/26/19   [provider]  methocarbamol (ROBAXIN) 750 MG tablet Take 1-2 tablets (750-1,500 mg total) by mouth every 8 (eight) hours as needed for muscle spasms and pain. 12/26/20   Lossie Faes, MD  montelukast (SINGULAIR) 10 MG tablet Take 1 tablet (10 mg total) by mouth daily. 11/29/20     naproxen (NAPROSYN) 500 MG tablet Take 1 tablet (500 mg total) by mouth 2 (two) times daily. 10/13/21   Kommor, Madison, MD  ondansetron (ZOFRAN) 4 MG tablet Take 1 tablet (4 mg total) by mouth every 6 (six) hours. 08/03/22   Harriet Pho, PA-C  oxyCODONE-acetaminophen (PERCOCET/ROXICET) 5-325 MG tablet Take 1 tablet by mouth every 6 (six) hours as needed for severe pain. 08/03/22   Harriet Pho, PA-C  pantoprazole (PROTONIX) 20 MG tablet Take 1 tablet (20 mg total) by mouth daily. Patient taking differently: Take 20 mg by mouth at bedtime. 05/04/15   Nona Dell, PA-C  pantoprazole (PROTONIX) 20 MG tablet TAKE 1 TABLET BY MOUTH ONCE DAILY (NEEDS OFFICE VISIT) 06/13/21     prochlorperazine (COMPAZINE) 25 MG suppository Place 1 suppository (25 mg total) rectally every 12 (twelve) hours as needed for nausea or vomiting. 03/06/22   Cardama, Grayce Sessions, MD  Rimegepant Sulfate (NURTEC) 75 MG TBDP Take 75 mg by mouth as needed (migraine). Patient taking differently: Take 75 mg by mouth daily as needed (migraine). 03/31/19   Teague Carlis Abbott, Collene Leyden, PA-C  TOSYMRA 10 MG/ACT SOLN Inject 10 mg into the skin daily as needed (migraine). 09/04/20   [provider]      Allergies    Mushroom extract complex, Tylenol with codeine #3  [acetaminophen-codeine], Betadine [povidone iodine], and Povidone-iodine    Review of Systems   Review of Systems  Gastrointestinal:  Positive for abdominal pain, nausea and vomiting. Negative for constipation and diarrhea.    Physical Exam Updated Vital Signs BP (!) 153/71   Pulse 82   Temp 99.1 F (37.3 C) (Oral)   Resp 18   LMP  (Approximate) Comment: last menses 2 yrs ago  SpO2 98%  Physical Exam Vitals and nursing note reviewed.  Constitutional:      General: She is not in acute distress.    Appearance: She is well-developed.  HENT:     Head: Normocephalic and atraumatic.  Eyes:     Conjunctiva/sclera: Conjunctivae normal.  Cardiovascular:     Rate and Rhythm: Normal rate and regular rhythm.  Heart sounds: No murmur heard. Pulmonary:     Effort: Pulmonary effort is normal. No respiratory distress.     Breath sounds: Normal breath sounds.  Abdominal:     Palpations: Abdomen is soft.     Tenderness: There is abdominal tenderness in the right lower quadrant. There is no guarding or rebound.  Musculoskeletal:        General: No swelling.     Cervical back: Neck supple.  Skin:    General: Skin is warm and dry.     Capillary Refill: Capillary refill takes less than 2 seconds.  Neurological:     Mental Status: She is alert.  Psychiatric:        Mood and Affect: Mood normal.     ED Results / Procedures / Treatments   Labs (all labs ordered are listed, but only abnormal results are displayed) Labs Reviewed  COMPREHENSIVE METABOLIC PANEL - Abnormal; Notable for the following components:      Result Value   CO2 20 (*)    AST 46 (*)    ALT 86 (*)    All other components within normal limits  CBC - Abnormal; Notable for the following components:   WBC 11.7 (*)    RBC 5.23 (*)    Platelets 420 (*)    All other components within normal limits  URINALYSIS, ROUTINE W REFLEX MICROSCOPIC - Abnormal; Notable for the following components:   Ketones, ur 40 (*)    All  other components within normal limits  LIPASE, BLOOD  PREGNANCY, URINE  LACTIC ACID, PLASMA  LACTIC ACID, PLASMA    EKG None  Radiology CT ABDOMEN PELVIS W CONTRAST  Result Date: 09/25/2022 CLINICAL DATA:  RLQ abdominal pain EXAM: CT ABDOMEN AND PELVIS WITH CONTRAST TECHNIQUE: Multidetector CT imaging of the abdomen and pelvis was performed using the standard protocol following bolus administration of intravenous contrast. RADIATION DOSE REDUCTION: This exam was performed according to the departmental dose-optimization program which includes automated exposure control, adjustment of the mA and/or kV according to patient size and/or use of iterative reconstruction technique. CONTRAST:  117mL OMNIPAQUE IOHEXOL 300 MG/ML  SOLN COMPARISON:  CT AP, 08/03/2022 and 03/06/2022 FINDINGS: Lower chest: No acute abnormality. Hepatobiliary: No focal liver abnormality is seen. Status post cholecystectomy. No biliary dilatation. Pancreas: No pancreatic ductal dilatation or surrounding inflammatory changes. Spleen: Normal in size without focal abnormality. Adrenals/Urinary Tract: Adrenal glands are unremarkable. Kidneys are normal, without renal calculi, focal lesion, or hydronephrosis. Bladder is unremarkable. Stomach/Bowel: Stomach is within normal limits. Appendix appears normal. Sigmoidectomy with intact anastomosis. Mild burden of inspissated stool within colon. No evidence of bowel wall thickening, distention, or inflammatory changes. Vascular/Lymphatic: No significant vascular findings are present. No enlarged abdominal or pelvic lymph nodes. Reproductive: Uterus and adnexa are unremarkable. Other: No abdominal wall hernia or abnormality. No abdominopelvic ascites. Musculoskeletal: No acute or significant osseous findings. IMPRESSION: 1. No acute abdominopelvic process. 2. Surgical changes of sigmoidectomy without evidence of complication. Additional incidental, chronic and senescent findings as above. 3. Mild  burden of inspissated stool within rectum and distal colon. In the absence of additional findings, constipation can be a cause of patient's reported discomfort. Electronically Signed   By: Michaelle Birks M.D.   On: 09/25/2022 17:58   US PELVIC COMPLETE W TRANSVAGINAL AND TORSION R/O  Result Date: 09/25/2022 CLINICAL DATA:  Right lower quadrant pain for 4 days, now with nausea and vomiting. EXAM: TRANSABDOMINAL AND TRANSVAGINAL ULTRASOUND OF PELVIS DOPPLER ULTRASOUND OF OVARIES TECHNIQUE:  Both transabdominal and transvaginal ultrasound examinations of the pelvis were performed. Transabdominal technique was performed for global imaging of the pelvis including uterus, ovaries, adnexal regions, and pelvic cul-de-sac. It was necessary to proceed with endovaginal exam following the transabdominal exam to visualize the uterus and adnexa. Color and duplex Doppler ultrasound was utilized to evaluate blood flow to the ovaries. COMPARISON:  CT abdomen pelvis dated 08/03/2022 and pelvic ultrasound dated 03/05/2022. FINDINGS: Uterus Measurements: 5.8 x 3.2 x 4.4 cm = volume: 43 mL. No fibroids or other mass visualized. Endometrium Thickness: 9 mm.  No focal abnormality visualized. Right ovary Not visualized. Left ovary Measurements: 3.0 x 2.4 x 2.2 cm = volume: 8 mL. A left ovarian cyst measures 2.3 x 1.4 x 1.3 cm. No imaging follow-up is recommended for this finding. Pulsed Doppler evaluation of both ovaries demonstrates normal low-resistance arterial and venous waveforms. Other findings No abnormal free fluid. IMPRESSION: 1. No evidence of ovarian torsion. 2. Right ovary not visualized. Electronically Signed   By: Zerita Boers M.D.   On: 09/25/2022 17:06    Procedures Procedures    Medications Ordered in ED Medications  promethazine (PHENERGAN) 25 MG/ML injection (  Not Given 09/25/22 1621)  promethazine (PHENERGAN) 12.5 mg in sodium chloride 0.9 % 50 mL IVPB (0 mg Intravenous Stopped 09/25/22 1718)  sodium chloride  0.9 % bolus 1,000 mL (1,000 mLs Intravenous New Bag/Given 09/25/22 1603)  morphine (PF) 2 MG/ML injection 2 mg (2 mg Intravenous Given 09/25/22 1608)  fentaNYL (SUBLIMAZE) injection 100 mcg (100 mcg Intravenous Given 09/25/22 1735)  iohexol (OMNIPAQUE) 300 MG/ML solution 100 mL (100 mLs Intravenous Contrast Given 09/25/22 1750)    ED Course/ Medical Decision Making/ A&P                             Medical Decision Making Patient is a 37 year old female, here for abdominal pains for the last 4 days, and nausea and vomiting that started yesterday.  We will obtain a transvaginal ultrasound to evaluate for right ovary, and basic labs.  We will also give her pain medication for this, and nausea medication.  Amount and/or Complexity of Data Reviewed Labs: ordered.    Details: Lactic acid within normal limits, mild transaminitis, and mild leukocytosis Radiology: ordered.    Details: Right ovary not visualized on transvaginal ultrasound, CT scan shows a normal adnexa, mild stool in the rectum and sigmoid colon. Discussion of management or test interpretation with external provider(s): Discussed with patient, right ovary not visualized that this CT scan ordered, no acute findings on CAT scan other than mild stool in the right rectum and sigmoid colon.  She states she is having normal bowel movements, I offered disimpaction and she declined.  We discussed bowel preps for this.  Additionally we discussed that her lactic acid was within normal limits, she may be still having some cystic pain or pain from her endometriosis I encouraged her to follow-up with her PCP and her gynecologist for further evaluation.  She will also need to follow-up with her primary care doctor for repeat labs for monitoring of her transaminitis.  Pain likely is secondary to her endometriosis we discussed using warm compresses, and Toradol for pain control.  Phenergan suppositories sent if intractable nausea vomiting.  Pain is better  controlled, and she is well-appearing upon discharge, return precautions emphasized.  Risk Prescription drug management.   Final Clinical Impression(s) / ED Diagnoses Final diagnoses:  Right lower  quadrant abdominal pain  Nausea and vomiting, unspecified vomiting type    Rx / DC Orders ED Discharge Orders          Ordered    ketorolac (TORADOL) 10 MG tablet  Every 6 hours PRN        09/25/22 1817    promethazine (PHENERGAN) 25 MG suppository  Every 6 hours PRN        09/25/22 1817              Rasean Joos, Si Gaul, PA 09/25/22 1833    Lennice Sites, DO 09/25/22 1917

## 2022-09-25 NOTE — ED Triage Notes (Signed)
Abdominal pain x 4 days Nausea vomiting started yesterday "Feels like a cyst rupturing"

## 2022-09-26 IMAGING — DX DG CHEST 1V PORT
1 series · 1 of 1 positions shown · non-contrast
Comparison: None.

CLINICAL DATA: Shortness of breath with cough

EXAM:
PORTABLE CHEST 1 VIEW

[chest ap]
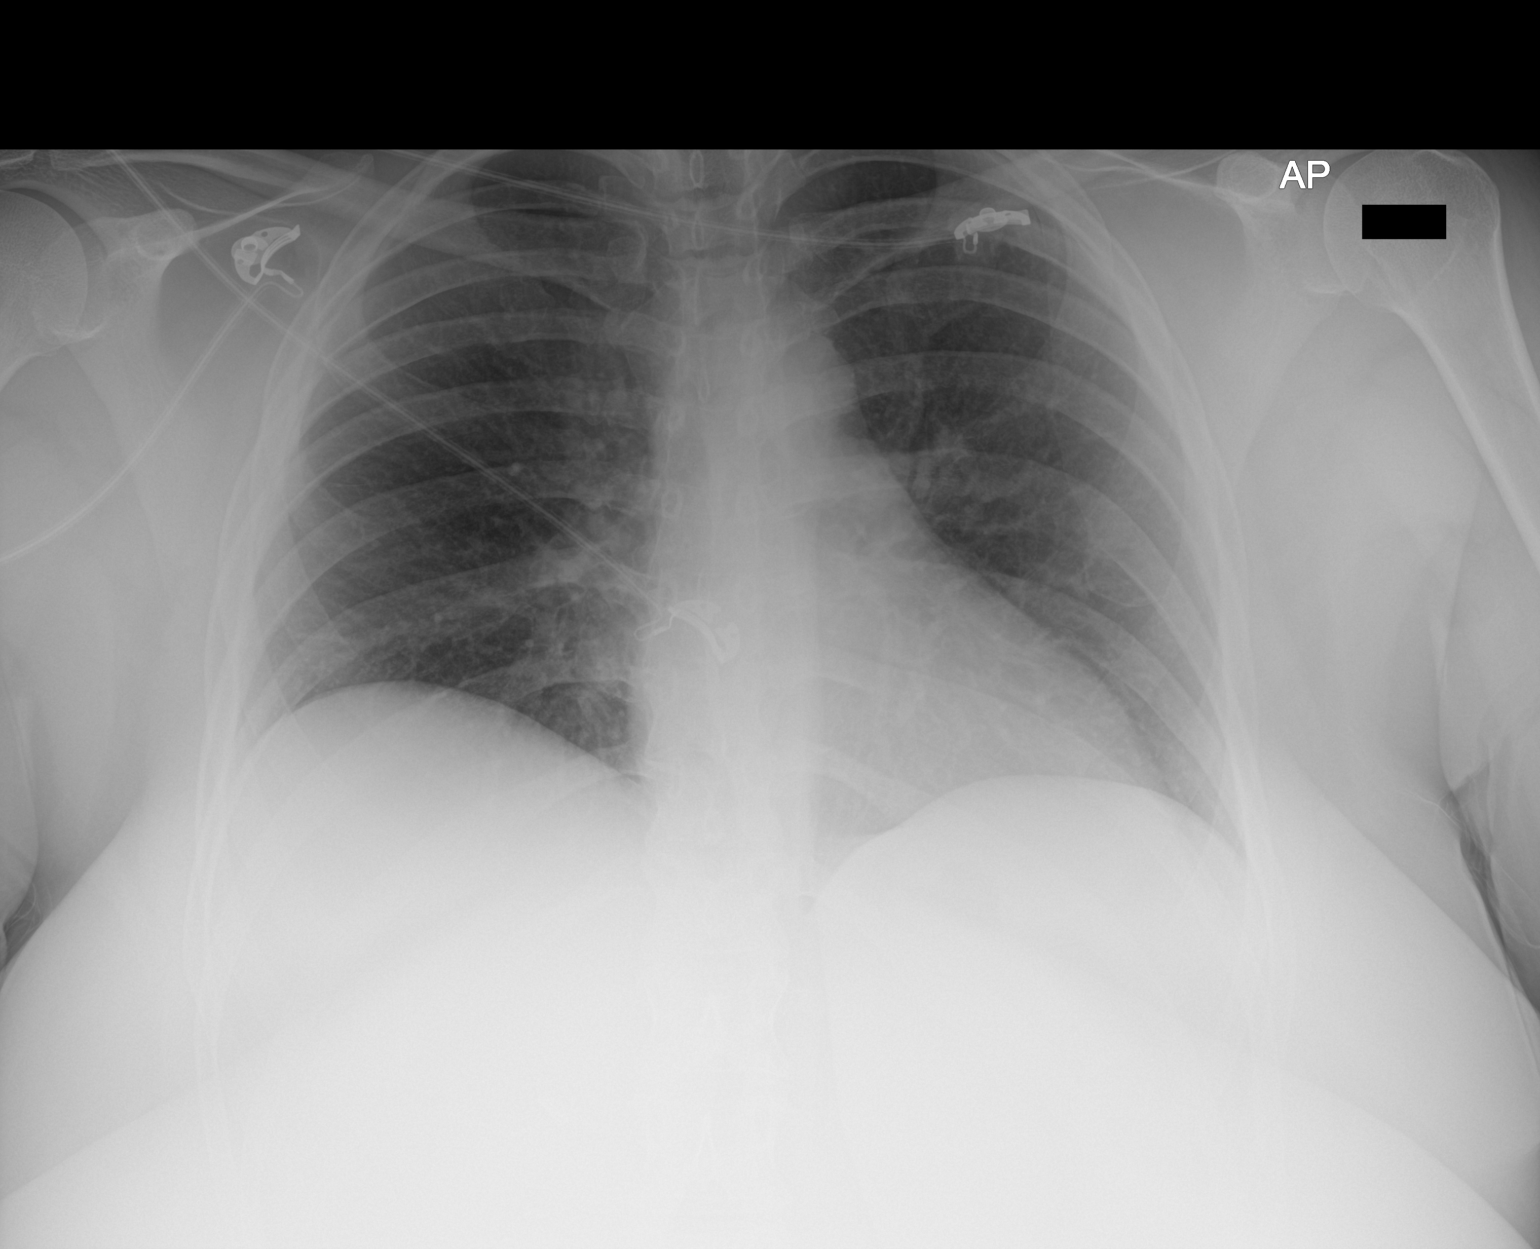

[1 of 1 positions shown; findings below may reference images not displayed]

FINDINGS: Lungs are clear. Heart size and pulmonary vascularity are normal. No
adenopathy. No bone lesions.
IMPRESSION: Lungs clear.  Cardiac silhouette normal.

## 2022-10-23 ENCOUNTER — Ambulatory Visit
Admission: RE | Admit: 2022-10-23 | Discharge: 2022-10-23 | Disposition: A | Discharge status: Home / Self Care | Payer: Managed Care, Other (non HMO) | Source: Ambulatory Visit | Attending: Orthopedic Surgery | Admitting: Orthopedic Surgery

## 2022-10-23 DIAGNOSIS — M5441 Lumbago with sciatica, right side: Secondary | ICD-10-CM

## 2022-11-18 DIAGNOSIS — Z319 Encounter for procreative management, unspecified: Secondary | ICD-10-CM | POA: Diagnosis not present

## 2022-12-25 ENCOUNTER — Emergency Department (HOSPITAL_BASED_OUTPATIENT_CLINIC_OR_DEPARTMENT_OTHER)
Admission: EM | Admit: 2022-12-25 | Discharge: 2022-12-25 | Disposition: A | Discharge status: Home / Self Care | Payer: Managed Care, Other (non HMO) | Attending: Emergency Medicine | Admitting: Emergency Medicine

## 2022-12-25 ENCOUNTER — Other Ambulatory Visit: Payer: Self-pay

## 2022-12-25 ENCOUNTER — Encounter (HOSPITAL_BASED_OUTPATIENT_CLINIC_OR_DEPARTMENT_OTHER): Payer: Self-pay | Admitting: Urology

## 2022-12-25 ENCOUNTER — Emergency Department (HOSPITAL_BASED_OUTPATIENT_CLINIC_OR_DEPARTMENT_OTHER): Payer: Managed Care, Other (non HMO)

## 2022-12-25 DIAGNOSIS — R1030 Lower abdominal pain, unspecified: Secondary | ICD-10-CM | POA: Insufficient documentation

## 2022-12-25 DIAGNOSIS — R112 Nausea with vomiting, unspecified: Secondary | ICD-10-CM | POA: Insufficient documentation

## 2022-12-25 DIAGNOSIS — I10 Essential (primary) hypertension: Secondary | ICD-10-CM | POA: Insufficient documentation

## 2022-12-25 DIAGNOSIS — R109 Unspecified abdominal pain: Secondary | ICD-10-CM | POA: Diagnosis present

## 2022-12-25 DIAGNOSIS — Z79899 Other long term (current) drug therapy: Secondary | ICD-10-CM | POA: Diagnosis not present

## 2022-12-25 LAB — COMPREHENSIVE METABOLIC PANEL
ALT: 39 U/L (ref 0–44)
AST: 27 U/L (ref 15–41)
Albumin: 3.8 g/dL (ref 3.5–5.0)
Alkaline Phosphatase: 60 U/L (ref 38–126)
Anion gap: 10 (ref 5–15)
BUN: 12 mg/dL (ref 6–20)
CO2: 22 mmol/L (ref 22–32)
Calcium: 8.8 mg/dL — ABNORMAL LOW (ref 8.9–10.3)
Chloride: 107 mmol/L (ref 98–111)
Creatinine, Ser: 1.19 mg/dL — ABNORMAL HIGH (ref 0.44–1.00)
GFR, Estimated: >60 mL/min (ref >60)
Glucose, Bld: 102 mg/dL — ABNORMAL HIGH (ref 70–99)
Potassium: 3.8 mmol/L (ref 3.5–5.1)
Sodium: 139 mmol/L (ref 135–145)
Total Bilirubin: 0.5 mg/dL (ref 0.3–1.2)
Total Protein: 7 g/dL (ref 6.5–8.1)

## 2022-12-25 LAB — CBC
HCT: 42.5 % (ref 36.0–46.0)
Hemoglobin: 13.8 g/dL (ref 12.0–15.0)
MCH: 27.3 pg (ref 26.0–34.0)
MCHC: 32.5 g/dL (ref 30.0–36.0)
MCV: 84 fL (ref 80.0–100.0)
Platelets: 375 10*3/uL (ref 150–400)
RBC: 5.06 MIL/uL (ref 3.87–5.11)
RDW: 13.3 % (ref 11.5–15.5)
WBC: 8.4 10*3/uL (ref 4.0–10.5)
nRBC: 0 % (ref 0.0–0.2)

## 2022-12-25 LAB — URINALYSIS, MICROSCOPIC (REFLEX): RBC / HPF: NONE SEEN RBC/hpf (ref 0–5)

## 2022-12-25 LAB — URINALYSIS, ROUTINE W REFLEX MICROSCOPIC
Bilirubin Urine: NEGATIVE
Glucose, UA: NEGATIVE mg/dL
Hgb urine dipstick: NEGATIVE
Ketones, ur: NEGATIVE mg/dL
Nitrite: NEGATIVE
Protein, ur: NEGATIVE mg/dL
Specific Gravity, Urine: >=1.03 (ref 1.005–1.030)
pH: 6 (ref 5.0–8.0)

## 2022-12-25 LAB — PREGNANCY, URINE: Preg Test, Ur: NEGATIVE

## 2022-12-25 LAB — LIPASE, BLOOD: Lipase: 29 U/L (ref 11–51)

## 2022-12-25 MED ORDER — ONDANSETRON HCL 4 MG/2ML IJ SOLN
4.0000 mg | Freq: Once | INTRAMUSCULAR | Status: AC
  Administered 2022-12-25: 4 mg via INTRAVENOUS
  Filled 2022-12-25: qty 2

## 2022-12-25 MED ORDER — MORPHINE SULFATE (PF) 4 MG/ML IV SOLN
4.0000 mg | Freq: Once | INTRAVENOUS | Status: AC
  Administered 2022-12-25: 4 mg via INTRAVENOUS
  Filled 2022-12-25: qty 1

## 2022-12-25 MED ORDER — IOHEXOL 300 MG/ML  SOLN
100.0000 mL | Freq: Once | INTRAMUSCULAR | Status: AC | PRN
  Administered 2022-12-25: 100 mL via INTRAVENOUS

## 2022-12-25 MED ORDER — ONDANSETRON HCL 8 MG PO TABS: 8.0000 mg | ORAL_TABLET | Freq: Four times a day (QID) | ORAL | 0 refills | Status: AC | PRN

## 2022-12-25 MED ORDER — DIPHENHYDRAMINE HCL 50 MG/ML IJ SOLN
25.0000 mg | Freq: Once | INTRAMUSCULAR | Status: AC
  Administered 2022-12-25: 25 mg via INTRAVENOUS
  Filled 2022-12-25: qty 1

## 2022-12-25 MED ORDER — PROCHLORPERAZINE EDISYLATE 10 MG/2ML IJ SOLN
10.0000 mg | Freq: Once | INTRAMUSCULAR | Status: AC
  Administered 2022-12-25: 10 mg via INTRAVENOUS
  Filled 2022-12-25: qty 2

## 2022-12-25 MED ORDER — OXYCODONE-ACETAMINOPHEN 5-325 MG PO TABS: 1.0000 | ORAL_TABLET | Freq: Four times a day (QID) | ORAL | 0 refills | Status: AC | PRN

## 2022-12-25 MED ORDER — SODIUM CHLORIDE 0.9 % IV BOLUS
1000.0000 mL | Freq: Once | INTRAVENOUS | Status: AC
  Administered 2022-12-25: 1000 mL via INTRAVENOUS

## 2022-12-25 NOTE — ED Triage Notes (Signed)
Pt states Abd pain ,N/V that started on Tuesday  Denies diarrhea, low grade fever at home Misscarriage last  month per pt H/o endometriosis and feels like it is a flare up

## 2022-12-25 NOTE — ED Provider Notes (Signed)
[place] [facility] Provider Note   CSN: [id number] Arrival date & time: 12/25/22  1316     History {Add pertinent medical, surgical, social history, OB history to HPI:1} Chief Complaint  Patient presents with   Abdominal Pain    [name] [name] is a 37 y.o. female with past medical history significant for endometriosis, ulcerative colitis with previous bowel resection, hypertension, GERD who presents with concern for abdominal pain, nausea, vomiting that started on Tuesday and has been getting worse since then.  She denies any diarrhea, constipation she endorses fever with Tmax 101 at home.  She does endorse that she had a miscarriage last month.  She reports that this feels like a flare of her endometriosis.  She does not have a menstrual cycle at this time, reports the flares happen intermittently without warning.  Rates her pain 8/10.  It is most focally in the lower quadrants.  Denies dysuria, vaginal discharge.   Abdominal Pain      Home Medications Prior to Admission medications   Medication Sig Start Date End Date Taking? Authorizing Provider  acetaminophen (TYLENOL) 500 MG tablet Take 1,000 mg by mouth every 6 (six) hours as needed for headache.    [provider]  albuterol (PROVENTIL) (5 MG/ML) 0.5% nebulizer solution Inhale 1/2 vial (2.5 mg total) via nebulizer every 6 (six) hours as needed for wheezing or shortness of breath. 11/02/20   Rancour, Jeannett Senior, MD  azelastine (ASTELIN) 0.1 % nasal spray Spray 1-2 puffs in each nostril twice a day 11/29/20     butorphanol (STADOL) 10 MG/ML nasal spray Use 1 spray in one nostril as needed every 6 hours for 30 days 07/16/21     cetirizine (ZYRTEC) 10 MG tablet Take 10 mg by mouth daily. Patient not taking: Reported on 01/03/2021    [provider]  clonazePAM (KLONOPIN) 0.5 MG tablet Take 0.25 mg by mouth at bedtime as needed for anxiety.    [provider]  cyclobenzaprine  (FLEXERIL) 10 MG tablet Take 1 tablet (10 mg total) by mouth 3 (three) times daily as needed for muscle spasms. 03/31/19   Glyn Ade, Scot Jun, PA-C  diphenhydrAMINE (BENADRYL) 50 MG tablet Take 50 mg by mouth daily as needed for itching.    [provider]  EPINEPHrine 0.3 mg/0.3 mL IJ SOAJ injection Inject 0.3 mg into the muscle as needed for anaphylaxis. Patient taking differently: Inject 0.3 mg into the muscle once as needed for anaphylaxis. 10/15/20   Gilda Crease, MD  escitalopram (LEXAPRO) 20 MG tablet Take 20 mg by mouth daily.    [provider]  famotidine (PEPCID) 20 MG tablet Take 1 tablet (20 mg total) by mouth 2 (two) times daily for 7 days. 10/19/20 10/26/20  Jerald Kief, MD  fluticasone (FLONASE) 50 MCG/ACT nasal spray Place 2 sprays into both nostrils daily. 09/23/17   Waldon Merl, PA-C  fluticasone furoate-vilanterol (BREO ELLIPTA) 200-25 MCG/ACT AEPB Inhale into the lungs once daily as instructed. 05/02/21     fluticasone-salmeterol (ADVAIR HFA) 115-21 MCG/ACT inhaler Inhale 2 puffs by mouth into the lungs twice a day 10/23/20     hydrOXYzine (ATARAX) 10 MG tablet Take 1 tablet (10 mg total) by mouth every 8 (eight) hours as needed. 06/13/21     ibuprofen (ADVIL) 200 MG tablet Take 800 mg by mouth every 6 (six) hours as needed for headache.    [provider]  ipratropium (ATROVENT) 0.03 % nasal spray  2 sprays per nostril 2-3 times daily as needed for congestion and post-nasal drainage 10/09/20   Lurene Shadow, PA-C  ketorolac (TORADOL) 10 MG tablet Take 1 tablet (10 mg total) by mouth every 6 (six) hours as needed. 09/25/22   Small, Brooke L, PA  labetalol (NORMODYNE) 200 MG tablet Take 200 mg by mouth 2 (two) times daily.    [provider]  Lasmiditan Succinate (REYVOW) 100 MG TABS Take 100 mg by mouth as needed. Patient not taking: Reported on 01/03/2021 08/11/19   Glyn Ade, Scot Jun, PA-C  levalbuterol [facility] HFA) 45 MCG/ACT  inhaler Inhale 2 puffs into the lungs every 4 (four) hours as needed for wheezing.    [provider]  levocetirizine (XYZAL) 5 MG tablet Take 1 tablet by mouth every evening. 05/16/21     metFORMIN (GLUCOPHAGE) 500 MG tablet Take 1,000 mg by mouth 2 (two) times daily. 01/26/19   [provider]  methocarbamol (ROBAXIN) 750 MG tablet Take 1-2 tablets (750-1,500 mg total) by mouth every 8 (eight) hours as needed for muscle spasms and pain. 12/26/20   Logan Bores, MD  montelukast (SINGULAIR) 10 MG tablet Take 1 tablet (10 mg total) by mouth daily. 11/29/20     naproxen (NAPROSYN) 500 MG tablet Take 1 tablet (500 mg total) by mouth 2 (two) times daily. 10/13/21   Kommor, Madison, MD  ondansetron (ZOFRAN) 4 MG tablet Take 1 tablet (4 mg total) by mouth every 6 (six) hours. 08/03/22   Gareth Eagle, PA-C  oxyCODONE-acetaminophen (PERCOCET/ROXICET) 5-325 MG tablet Take 1 tablet by mouth every 6 (six) hours as needed for severe pain. 08/03/22   Gareth Eagle, PA-C  pantoprazole (PROTONIX) 20 MG tablet Take 1 tablet (20 mg total) by mouth daily. Patient taking differently: Take 20 mg by mouth at bedtime. 05/04/15   Barrett Henle, PA-C  pantoprazole (PROTONIX) 20 MG tablet TAKE 1 TABLET BY MOUTH ONCE DAILY (NEEDS OFFICE VISIT) 06/13/21     prochlorperazine (COMPAZINE) 25 MG suppository Place 1 suppository (25 mg total) rectally every 12 (twelve) hours as needed for nausea or vomiting. 03/06/22   Cardama, Amadeo Garnet, MD  promethazine (PHENERGAN) 25 MG suppository Place 1 suppository (25 mg total) rectally every 6 (six) hours as needed for nausea or vomiting. 09/25/22   Small, Brooke L, PA  Rimegepant Sulfate (NURTEC) 75 MG TBDP Take 75 mg by mouth as needed (migraine). Patient taking differently: Take 75 mg by mouth daily as needed (migraine). 03/31/19   Teague Chestine Spore, Scot Jun, PA-C  TOSYMRA 10 MG/ACT SOLN Inject 10 mg into the skin daily as needed (migraine). 09/04/20   [provider]      Allergies    Mushroom extract complex, Tylenol with codeine #3 [acetaminophen-codeine], Betadine [povidone iodine], and Povidone-iodine    Review of Systems   Review of Systems  Gastrointestinal:  Positive for abdominal pain.  All other systems reviewed and are negative.   Physical Exam Updated Vital Signs BP 138/87 (BP Location: Left Arm)   Pulse 100   Temp 98.8 F (37.1 C) (Oral)   Resp (!) 24   Ht 5\' 1"  (1.549 m)   Wt 98.9 kg   SpO2 97%   BMI 41.19 kg/m  Physical Exam Vitals and nursing note reviewed.  Constitutional:      General: She is not in acute distress.    Appearance: Normal appearance.  HENT:     Head: Normocephalic and atraumatic.  Eyes:  General:        Right eye: No discharge.        Left eye: No discharge.  Cardiovascular:     Rate and Rhythm: Normal rate and regular rhythm.     Heart sounds: No murmur heard.    No friction rub. No gallop.  Pulmonary:     Effort: Pulmonary effort is normal.     Breath sounds: Normal breath sounds.     Comments: Tachypnea, no accessory breath sounds noted Abdominal:     General: Bowel sounds are normal.     Palpations: Abdomen is soft.     Comments: Patient was significant tenderness to palpation, most focally in the lower quadrants, no rebound, rigidity, some guarding on my exam.  Skin:    General: Skin is warm and dry.     Capillary Refill: Capillary refill takes less than 2 seconds.  Neurological:     Mental Status: She is alert and oriented to person, place, and time.  Psychiatric:        Mood and Affect: Mood normal.        Behavior: Behavior normal.     ED Results / Procedures / Treatments   Labs (all labs ordered are listed, but only abnormal results are displayed) Labs Reviewed  LIPASE, BLOOD  COMPREHENSIVE METABOLIC PANEL  CBC  URINALYSIS, ROUTINE W REFLEX MICROSCOPIC  PREGNANCY, URINE    EKG None  Radiology No results found.  Procedures Procedures  {Document  cardiac monitor, telemetry assessment procedure when appropriate:1}  Medications Ordered in ED Medications  morphine (PF) 4 MG/ML injection 4 mg (4 mg Intravenous Given 12/25/22 1404)  ondansetron (ZOFRAN) injection 4 mg (4 mg Intravenous Given 12/25/22 1404)    ED Course/ Medical Decision Making/ A&P   {   Click here for ABCD2, HEART and other calculatorsREFRESH Note before signing :1}                          Medical Decision Making Amount and/or Complexity of Data Reviewed Labs: ordered. Radiology: ordered.  Risk Prescription drug management.   This patient is a 36 y.o. female  who presents to the ED for concern of abdominal pain.   Differential diagnoses prior to evaluation: The emergent differential diagnosis includes, but is not limited to,  The causes of generalized abdominal pain include but are not limited to AAA, mesenteric ischemia, appendicitis, diverticulitis, DKA, gastritis, gastroenteritis, AMI, nephrolithiasis, pancreatitis, peritonitis, adrenal insufficiency,lead poisoning, iron toxicity, intestinal ischemia, constipation, UTI,SBO/LBO, splenic rupture, biliary disease, IBD, IBS, PUD, or hepatitis, Ectopic pregnancy, ovarian torsion, PID. Marland Kitchen This is not an exhaustive differential.   Past Medical History / Co-morbidities / Social History: endometriosis, ulcerative colitis with previous bowel resection, hypertension, GERD  Physical Exam: Physical exam performed. The pertinent findings include: Patient visibly uncomfortable, she is some focal tenderness in bilateral lower quadrants, no rebound, rigidity, guarding, most focally in the right lower quadrant, but slightly superior to treatment Burney's point tenderness.  No abdominal distention noted.  Her vital signs are overall stable, she has had some hypertensive blood pressures in the emergency department with blood pressure 150/73 most recently, she has had some intermittent tachypnea, respirations 24, likely secondary to  pain, no accessory breath sounds noted.  Lab Tests/Imaging studies: I personally interpreted labs/imaging and the pertinent results include: CBC unremarkable, lipase normal, UA with trace leukocytes, few bacteria, but some squamous contamination, no evidence of a true urinary tract infection, CMP overall unremarkable, mildly  elevated creatinine at 1.19, will administer fluids as patient did endorse significant vomiting, concern for mild dehydration.  Negative urine pregnancy test.  I independently interpreted CT abdomen pelvis with contrast which shows. ***I agree with the radiologist interpretation.  Cardiac monitoring: EKG obtained and interpreted by my attending physician which shows: ***   Medications: I ordered medication including ***.  I have reviewed the patients home medicines and have made adjustments as needed.   Disposition: After consideration of the diagnostic results and the patients response to treatment, I feel that *** .   ***emergency department workup does not suggest an emergent condition requiring admission or immediate intervention beyond what has been performed at this time. The plan is: ***. The patient is safe for discharge and has been instructed to return immediately for worsening symptoms, change in symptoms or any other concerns.  Final Clinical Impression(s) / ED Diagnoses Final diagnoses:  None    Rx / DC Orders ED Discharge Orders     None

## 2022-12-25 NOTE — Discharge Instructions (Addendum)
Please use Tylenol or ibuprofen for pain.  You may use 600 mg ibuprofen every 6 hours or 1000 mg of Tylenol every 6 hours.  You may choose to alternate between the 2.  This would be most effective.  Not to exceed 4 g of Tylenol within 24 hours.  Not to exceed 3200 mg ibuprofen 24 hours.  You can use the stronger pain medication, nausea medication as needed, and please follow-up with your OB/GYN at your earliest convenience.

## 2023-05-14 ENCOUNTER — Encounter (HOSPITAL_BASED_OUTPATIENT_CLINIC_OR_DEPARTMENT_OTHER): Payer: Self-pay | Admitting: Urology

## 2023-05-14 ENCOUNTER — Emergency Department (HOSPITAL_BASED_OUTPATIENT_CLINIC_OR_DEPARTMENT_OTHER)
Admission: EM | Admit: 2023-05-14 | Discharge: 2023-05-14 | Disposition: A | Discharge status: Home / Self Care | Payer: Managed Care, Other (non HMO) | Attending: Emergency Medicine | Admitting: Emergency Medicine

## 2023-05-14 ENCOUNTER — Other Ambulatory Visit: Payer: Self-pay

## 2023-05-14 ENCOUNTER — Emergency Department (HOSPITAL_BASED_OUTPATIENT_CLINIC_OR_DEPARTMENT_OTHER): Payer: Managed Care, Other (non HMO)

## 2023-05-14 DIAGNOSIS — R2232 Localized swelling, mass and lump, left upper limb: Secondary | ICD-10-CM | POA: Diagnosis not present

## 2023-05-14 DIAGNOSIS — W540XXA Bitten by dog, initial encounter: Secondary | ICD-10-CM | POA: Insufficient documentation

## 2023-05-14 DIAGNOSIS — Z79899 Other long term (current) drug therapy: Secondary | ICD-10-CM | POA: Diagnosis not present

## 2023-05-14 DIAGNOSIS — Z7984 Long term (current) use of oral hypoglycemic drugs: Secondary | ICD-10-CM | POA: Diagnosis not present

## 2023-05-14 DIAGNOSIS — M79642 Pain in left hand: Secondary | ICD-10-CM | POA: Diagnosis present

## 2023-05-14 DIAGNOSIS — I10 Essential (primary) hypertension: Secondary | ICD-10-CM | POA: Insufficient documentation

## 2023-05-14 MED ORDER — AMOXICILLIN-POT CLAVULANATE 875-125 MG PO TABS: 1.0000 | ORAL_TABLET | Freq: Two times a day (BID) | ORAL | 0 refills | Status: AC

## 2023-05-14 NOTE — ED Triage Notes (Signed)
Pt states own dog bit to left hand last night, dog was playing and is blind and thought hand was part of the toy  Dog is vaccinated  Tdap UTD  Swelling noted

## 2023-05-14 NOTE — Discharge Instructions (Signed)
Your history, exam, workup today are consistent with a puncture type dog bite injury to the left hand.  Your exam was consistent with soft tissue injury and the x-ray on my interpretation did not show acute fracture.  We had a shared decision-making conversation and agreed to not make you wait even longer for the official reads but please follow-up on MyChart for them.  If your symptoms continue or do not improve, recommend follow-up with outpatient hand surgery.  If you start having any worsening redness, red streaking, or start getting signs of systemic infection, please consider return to the nearest emergency department.  Please take the antibiotics to prevent infection and continue monitoring your pet.

## 2023-05-14 NOTE — ED Provider Notes (Signed)
[place] [facility] Provider Note   CSN: [id number] Arrival date & time: 05/14/23  1242     History  Chief Complaint  Patient presents with   Animal Bite    [name] [name] is a 37 y.o. female.  The history is provided by the patient and medical records. No language interpreter was used.  Animal Bite Contact animal:  Dog Location:  Hand Hand injury location:  [place] of L hand Time since incident:  1 day Pain details:    Quality:  Aching   Severity:  Moderate   Timing:  Constant   Progression:  Unchanged Incident location:  Home Provoked: while playing.   Notifications:  None Animal's rabies vaccination status:  Up to date Animal in possession: yes   Tetanus status:  Up to date Relieved by:  Nothing Ineffective treatments:  None tried Associated symptoms: swelling   Associated symptoms: no fever and no rash        Home Medications Prior to Admission medications   Medication Sig Start Date End Date Taking? Authorizing Provider  acetaminophen (TYLENOL) 500 MG tablet Take 1,000 mg by mouth every 6 (six) hours as needed for headache.    [provider]  albuterol (PROVENTIL) (5 MG/ML) 0.5% nebulizer solution Inhale 1/2 vial (2.5 mg total) via nebulizer every 6 (six) hours as needed for wheezing or shortness of breath. 11/02/20   Rancour, Jeannett Senior, MD  azelastine (ASTELIN) 0.1 % nasal spray Spray 1-2 puffs in each nostril twice a day 11/29/20     butorphanol (STADOL) 10 MG/ML nasal spray Use 1 spray in one nostril as needed every 6 hours for 30 days 07/16/21     cetirizine (ZYRTEC) 10 MG tablet Take 10 mg by mouth daily. Patient not taking: Reported on 01/03/2021    [provider]  clonazePAM (KLONOPIN) 0.5 MG tablet Take 0.25 mg by mouth at bedtime as needed for anxiety.    [provider]  cyclobenzaprine (FLEXERIL) 10 MG tablet Take 1 tablet (10 mg total) by mouth 3 (three) times daily as needed for muscle  spasms. 03/31/19   Glyn Ade, Scot Jun, PA-C  diphenhydrAMINE (BENADRYL) 50 MG tablet Take 50 mg by mouth daily as needed for itching.    [provider]  EPINEPHrine 0.3 mg/0.3 mL IJ SOAJ injection Inject 0.3 mg into the muscle as needed for anaphylaxis. Patient taking differently: Inject 0.3 mg into the muscle once as needed for anaphylaxis. 10/15/20   Gilda Crease, MD  escitalopram (LEXAPRO) 20 MG tablet Take 20 mg by mouth daily.    [provider]  famotidine (PEPCID) 20 MG tablet Take 1 tablet (20 mg total) by mouth 2 (two) times daily for 7 days. 10/19/20 10/26/20  Jerald Kief, MD  fluticasone (FLONASE) 50 MCG/ACT nasal spray Place 2 sprays into both nostrils daily. 09/23/17   Waldon Merl, PA-C  fluticasone furoate-vilanterol (BREO ELLIPTA) 200-25 MCG/ACT AEPB Inhale into the lungs once daily as instructed. 05/02/21     fluticasone-salmeterol (ADVAIR HFA) 115-21 MCG/ACT inhaler Inhale 2 puffs by mouth into the lungs twice a day 10/23/20     hydrOXYzine (ATARAX) 10 MG tablet Take 1 tablet (10 mg total) by mouth every 8 (eight) hours as needed. 06/13/21     ibuprofen (ADVIL) 200 MG tablet Take 800 mg by mouth every 6 (six) hours as needed for headache.    [provider]  ipratropium (ATROVENT) 0.03 % nasal spray 2 sprays per nostril  2-3 times daily as needed for congestion and post-nasal drainage 10/09/20   Lurene Shadow, PA-C  ketorolac (TORADOL) 10 MG tablet Take 1 tablet (10 mg total) by mouth every 6 (six) hours as needed. 09/25/22   Small, Brooke L, PA  labetalol (NORMODYNE) 200 MG tablet Take 200 mg by mouth 2 (two) times daily.    [provider]  Lasmiditan Succinate (REYVOW) 100 MG TABS Take 100 mg by mouth as needed. Patient not taking: Reported on 01/03/2021 08/11/19   Glyn Ade, Scot Jun, PA-C  levalbuterol [facility] HFA) 45 MCG/ACT inhaler Inhale 2 puffs into the lungs every 4 (four) hours as needed for wheezing.    [provider]  levocetirizine (XYZAL) 5 MG tablet Take 1 tablet by mouth every evening. 05/16/21     metFORMIN (GLUCOPHAGE) 500 MG tablet Take 1,000 mg by mouth 2 (two) times daily. 01/26/19   [provider]  methocarbamol (ROBAXIN) 750 MG tablet Take 1-2 tablets (750-1,500 mg total) by mouth every 8 (eight) hours as needed for muscle spasms and pain. 12/26/20   Logan Bores, MD  montelukast (SINGULAIR) 10 MG tablet Take 1 tablet (10 mg total) by mouth daily. 11/29/20     naproxen (NAPROSYN) 500 MG tablet Take 1 tablet (500 mg total) by mouth 2 (two) times daily. 10/13/21   Kommor, Madison, MD  ondansetron (ZOFRAN) 8 MG tablet Take 1 tablet (8 mg total) by mouth every 6 (six) hours as needed for nausea or vomiting. 12/25/22   Prosperi, Christian H, PA-C  oxyCODONE-acetaminophen (PERCOCET/ROXICET) 5-325 MG tablet Take 1 tablet by mouth every 6 (six) hours as needed for severe pain. 12/25/22   Prosperi, Christian H, PA-C  pantoprazole (PROTONIX) 20 MG tablet Take 1 tablet (20 mg total) by mouth daily. Patient taking differently: Take 20 mg by mouth at bedtime. 05/04/15   Barrett Henle, PA-C  pantoprazole (PROTONIX) 20 MG tablet TAKE 1 TABLET BY MOUTH ONCE DAILY (NEEDS OFFICE VISIT) 06/13/21     prochlorperazine (COMPAZINE) 25 MG suppository Place 1 suppository (25 mg total) rectally every 12 (twelve) hours as needed for nausea or vomiting. 03/06/22   Cardama, Amadeo Garnet, MD  promethazine (PHENERGAN) 25 MG suppository Place 1 suppository (25 mg total) rectally every 6 (six) hours as needed for nausea or vomiting. 09/25/22   Small, Brooke L, PA  Rimegepant Sulfate (NURTEC) 75 MG TBDP Take 75 mg by mouth as needed (migraine). Patient taking differently: Take 75 mg by mouth daily as needed (migraine). 03/31/19   Teague Chestine Spore, Scot Jun, PA-C  TOSYMRA 10 MG/ACT SOLN Inject 10 mg into the skin daily as needed (migraine). 09/04/20   [provider]      Allergies    Mushroom extract  complex, Tylenol with codeine #3 [acetaminophen-codeine], Betadine [povidone iodine], and Povidone-iodine    Review of Systems   Review of Systems  Constitutional:  Negative for chills, fatigue and fever.  Respiratory:  Negative for shortness of breath.   Cardiovascular:  Negative for chest pain.  Gastrointestinal:  Negative for abdominal pain.  Musculoskeletal:  Negative for back pain.  Skin:  Positive for wound. Negative for rash.  Neurological:  Negative for headaches.  Psychiatric/Behavioral:  Negative for agitation.     Physical Exam Updated Vital Signs BP (!) 156/90 (BP Location: Left Arm)   Pulse 83   Temp (!) 97.4 F (36.3 C) (Tympanic)   Resp 18   Ht 5\' 1"  (1.549 m)   Wt 98.9 kg  SpO2 98%   BMI 41.20 kg/m  Physical Exam Vitals and nursing note reviewed.  HENT:     Head: Normocephalic.     Mouth/Throat:     Mouth: Mucous membranes are moist.  Eyes:     Conjunctiva/sclera: Conjunctivae normal.  Cardiovascular:     Rate and Rhythm: Normal rate.     Heart sounds: No murmur heard. Pulmonary:     Effort: Pulmonary effort is normal.     Breath sounds: No rhonchi or rales.  Chest:     Chest wall: No tenderness.  Abdominal:     General: Abdomen is flat.  Musculoskeletal:        General: Swelling, tenderness and signs of injury present.  Neurological:     General: No focal deficit present.     Mental Status: She is alert.     Sensory: Sensory deficit (tingling in L hand) present.     Motor: No weakness.  Psychiatric:        Mood and Affect: Mood normal.     ED Results / Procedures / Treatments   Labs (all labs ordered are listed, but only abnormal results are displayed) Labs Reviewed - No data to display  EKG None  Radiology No results found.  Procedures Procedures    Medications Ordered in ED Medications - No data to display  ED Course/ Medical Decision Making/ A&P                                 Medical Decision Making Amount and/or  Complexity of Data Reviewed Radiology: ordered.    Amariona Rathje is a 37 y.o. female with past medical history significant for anxiety, depression, hypertension, and GERD who presents with dog bite to her left hand.  She is right-handed.  She reports that while playing with her 37 year old [place] retriever who is blind last night, he thought he was biting a toy and accidentally bit her on the dorsum of the left hand.  She reports it bled and she used Dermabond to repair it after cleaning at home.  She said today it is having more pain and swelling and she decided to come evaluate it to make sure is not a fracture.  She reports her tetanus is up-to-date and her dog has full shots.  It was not intentional and she has no concern for rabies and the pet at this time.  She denies any other injuries or other complaints.  On exam, lungs clear.  Chest nontender.  Patient has no tenderness in the elbow or wrist and has some swelling and tenderness to the dorsum of the left hand.  There is a 0.5 cm laceration that was glued with no significant drainage or bleeding seen.  Intact pulses and capillary refill.  No crepitance.  She has some tingling in the fingers but otherwise can move them.  Exam otherwise unremarkable.  We had a discussion about how dog bites typically do not get repaired due to high concern for infection however she has already repaired it.  We had a discussion and agreed to get x-ray to look for fracture or bony fragments or foreign bodies.  Patient had x-rays that are still in process.  Patient does not want to wait longer for the reports but on my evaluation I do not see evidence of acute fracture at this time.  Did not see any foreign tooth fragments.  We had a discussion and  will start antibiotics and have her follow-up with her primary doctor and gave her number for a hand surgeon as well.  She will follow-up on the x-ray reports on MyChart.  She agreed with return precautions and follow-up  instructions and was discharged in good condition for outpatient treatment and management of her dog bite to the hand.             Final Clinical Impression(s) / ED Diagnoses Final diagnoses:  Dog bite, initial encounter  Left hand pain    Rx / DC Orders ED Discharge Orders          Ordered    amoxicillin-clavulanate (AUGMENTIN) 875-125 MG tablet  Every 12 hours        05/14/23 1450           Clinical Impression: 1. Dog bite, initial encounter   2. Left hand pain     Disposition: Discharge  Condition: Good  I have discussed the results, Dx and Tx plan with the pt(& family if present). He/she/they expressed understanding and agree(s) with the plan. Discharge instructions discussed at great length. Strict return precautions discussed and pt &/or family have verbalized understanding of the instructions. No further questions at time of discharge.    New Prescriptions   AMOXICILLIN-CLAVULANATE (AUGMENTIN) 875-125 MG TABLET    Take 1 tablet by mouth every 12 (twelve) hours for 10 days.    Follow Up: Joycelyn Rua, MD [address] [place] [address] [phone]     Domingo Mend [address] [place] [address] [phone]        Junko Ohagan, Canary Brim, MD 05/14/23 [phone]

## 2023-07-05 ENCOUNTER — Telehealth: Payer: Managed Care, Other (non HMO) | Admitting: Physician Assistant

## 2023-07-05 DIAGNOSIS — B9689 Other specified bacterial agents as the cause of diseases classified elsewhere: Secondary | ICD-10-CM

## 2023-07-05 DIAGNOSIS — J019 Acute sinusitis, unspecified: Secondary | ICD-10-CM

## 2023-07-05 MED ORDER — BENZONATATE 100 MG PO CAPS: 100.0000 mg | ORAL_CAPSULE | Freq: Three times a day (TID) | ORAL | 0 refills | Status: DC | PRN

## 2023-07-05 MED ORDER — AMOXICILLIN-POT CLAVULANATE 875-125 MG PO TABS: 1.0000 | ORAL_TABLET | Freq: Two times a day (BID) | ORAL | 0 refills | Status: DC

## 2023-07-05 NOTE — Addendum Note (Signed)
Addended by: Waldon Merl on: 07/05/2023 01:22 PM   Modules accepted: Orders

## 2023-07-05 NOTE — Progress Notes (Signed)

## 2023-07-05 NOTE — Progress Notes (Signed)
I have spent 5 minutes in review of e-visit questionnaire, review and updating patient chart, medical decision making and response to patient.   Mia Milan Cody Jacklynn Dehaas, PA-C    

## 2023-09-22 IMAGING — CT CT ABD-PELV W/ CM
2 of 4 series · 17 of 46 positions shown, 19 images · IV contrast (APPLIED)
Comparison: CT 10/30/2016

CLINICAL DATA: Diffuse lower abdominal pain with nausea vomiting
and diarrhea

EXAM:
CT ABDOMEN AND PELVIS WITH CONTRAST
TECHNIQUE: Multidetector CT imaging of the abdomen and pelvis was performed
using the standard protocol following bolus administration of
intravenous contrast.

[Series 3: abd/ pelvis 5.0 i30f 2 · axial · 0.97mm/px · z∈[+602,+1022]mm · 14 of 92 slices shown, 16 images]
[im 4/92  soft-tissue]
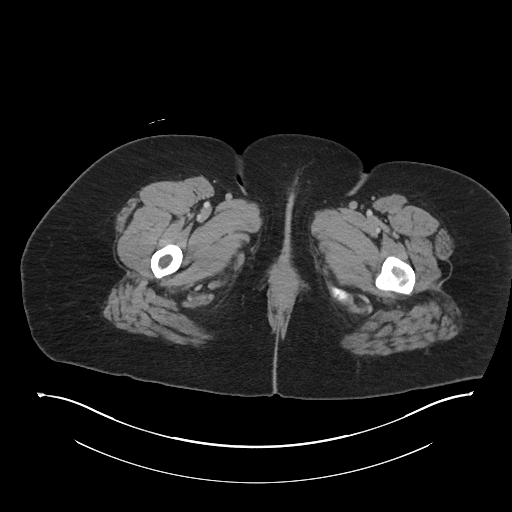
[im 4/92  bone]
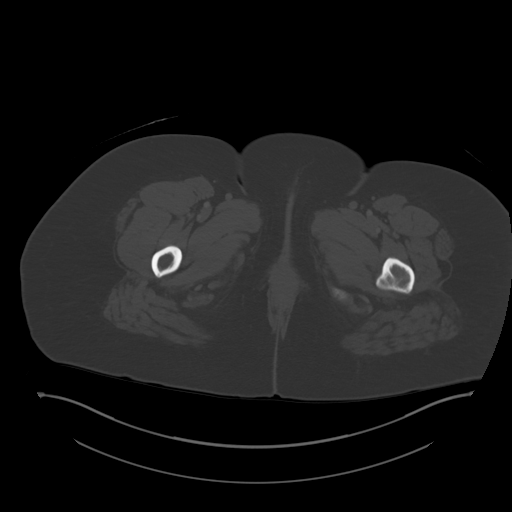
[im 12/92  soft-tissue]
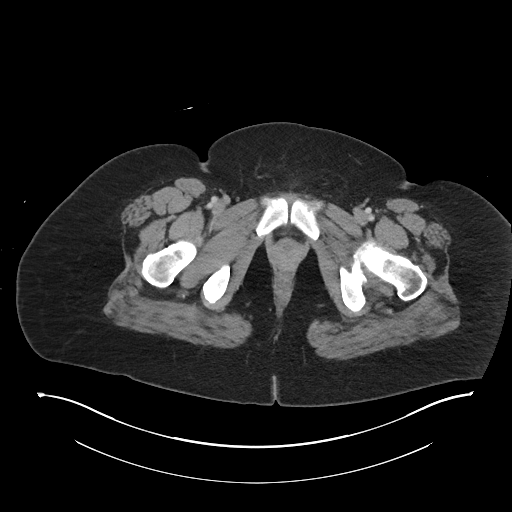
[im 16/92  soft-tissue]
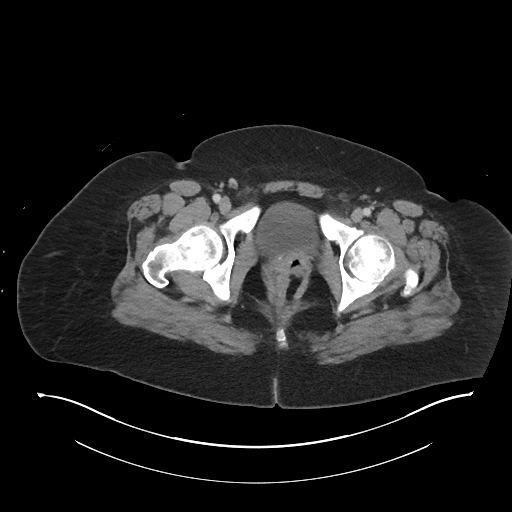
[im 24/92  soft-tissue]
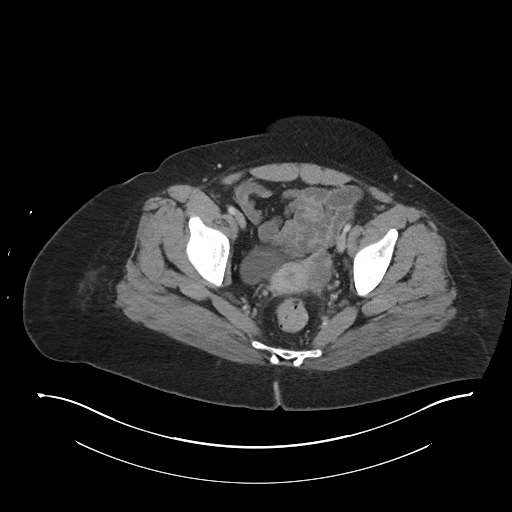
[im 32/92  soft-tissue]
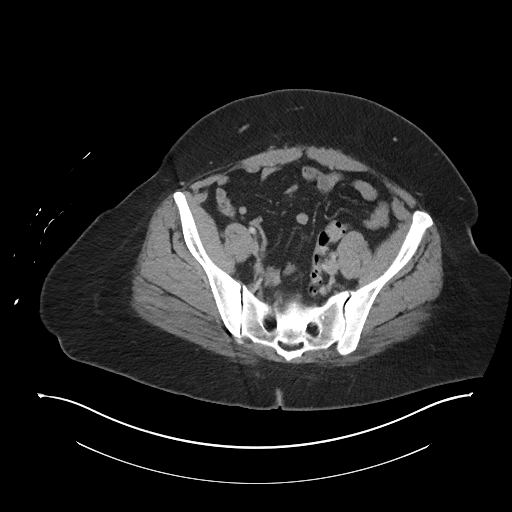
[im 36/92  soft-tissue]
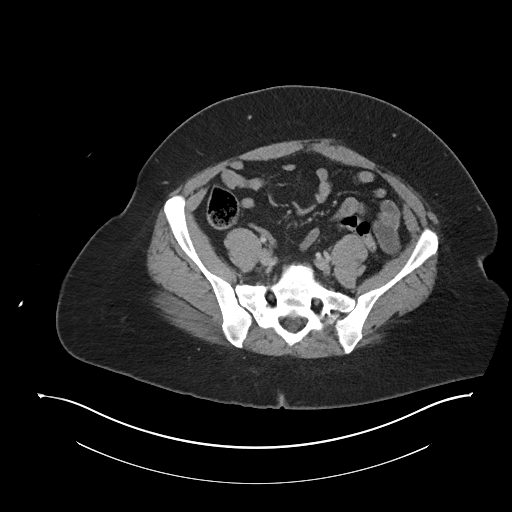
[im 44/92  soft-tissue]
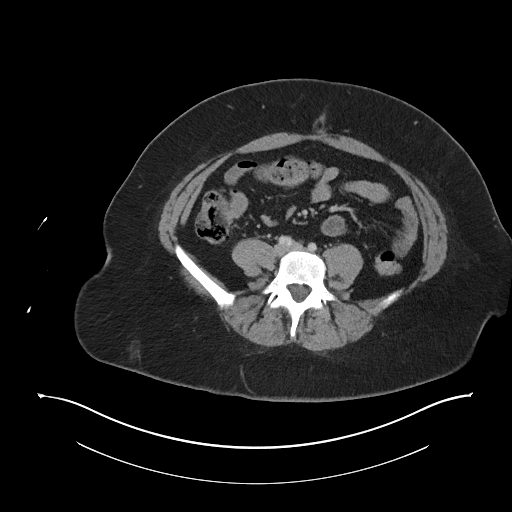
[im 48/92  soft-tissue]
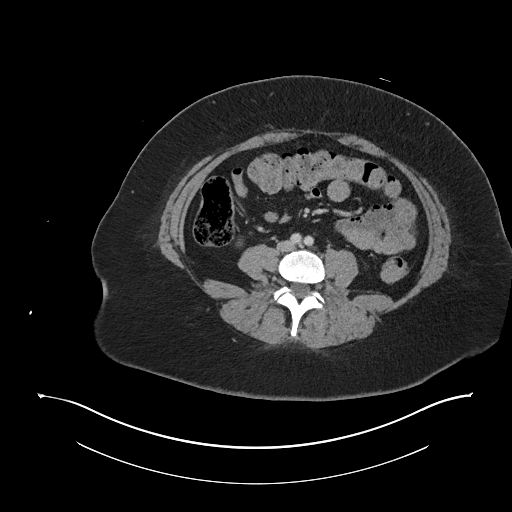
[im 56/92  soft-tissue]
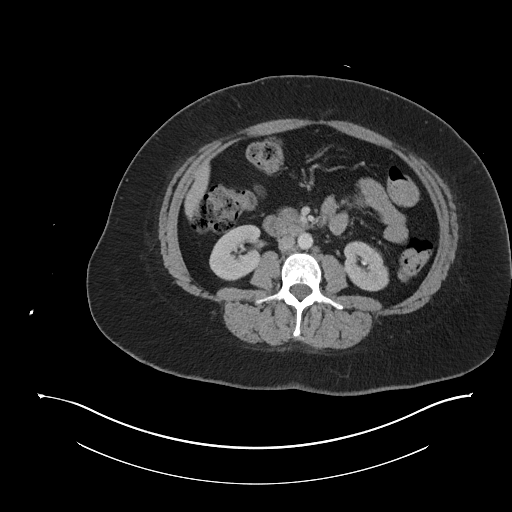
[im 56/92  bone]
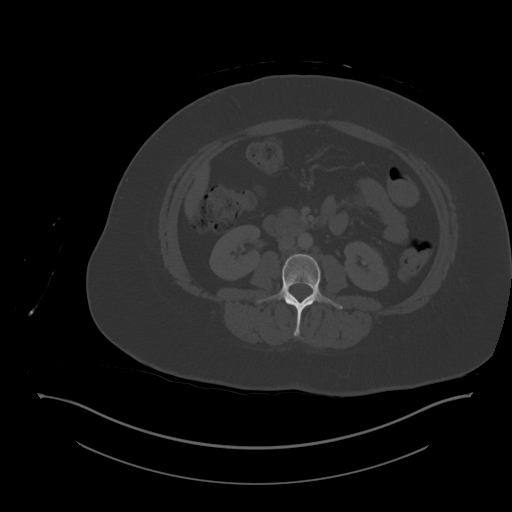
[im 60/92  soft-tissue]
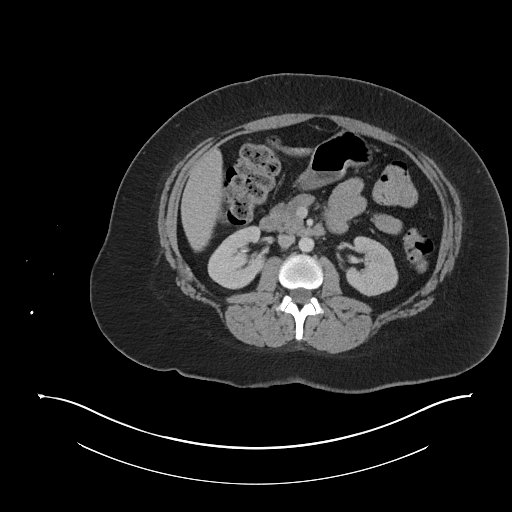
[im 68/92  soft-tissue]
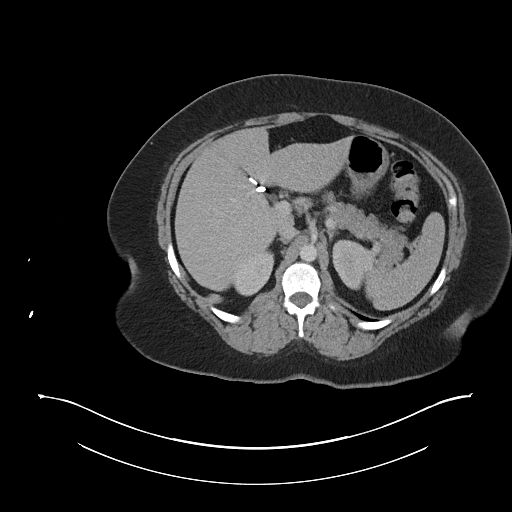
[im 76/92  soft-tissue]
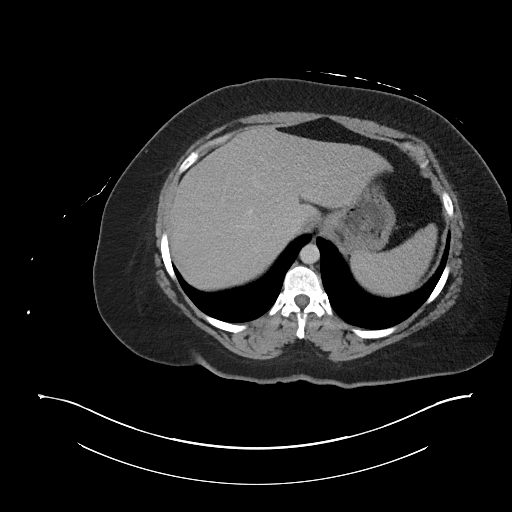
[im 80/92  soft-tissue]
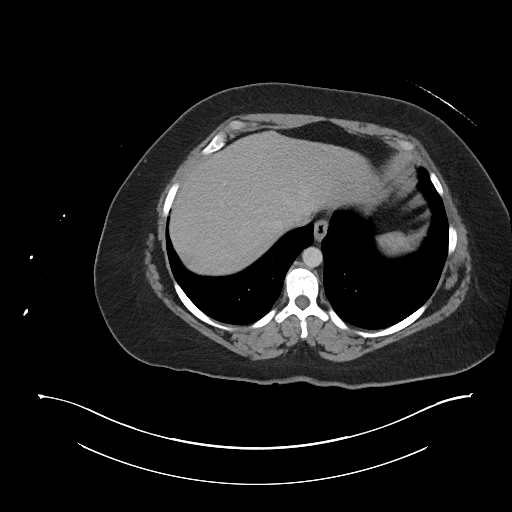
[im 88/92  soft-tissue]
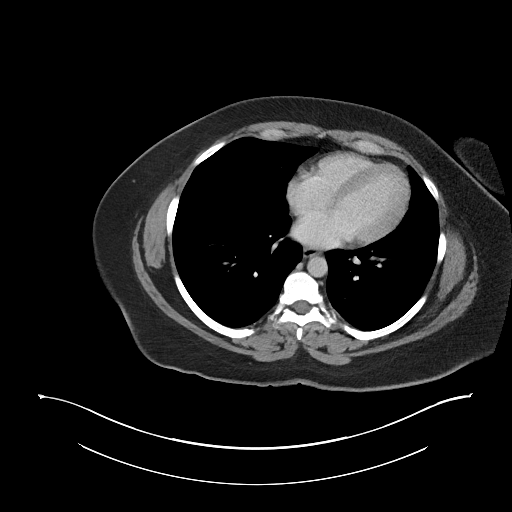

[Series 6: coronal soft tissue · coronal · 0.89mm/px · 3 of 113 slices shown]
[im 38/113  soft-tissue]
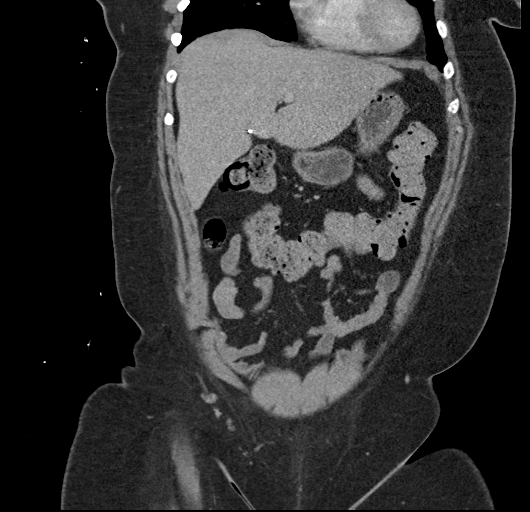
[im 50/113  soft-tissue]
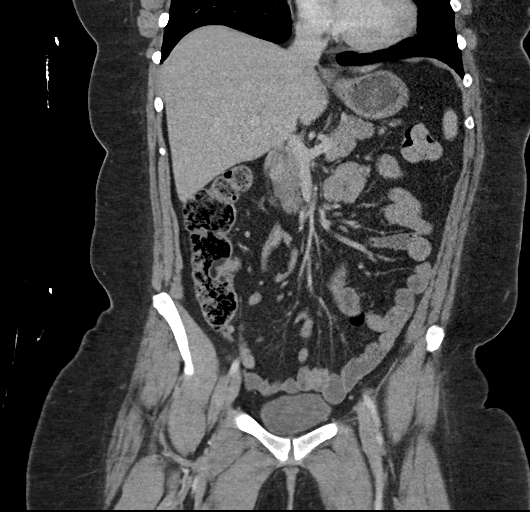
[im 63/113  soft-tissue]
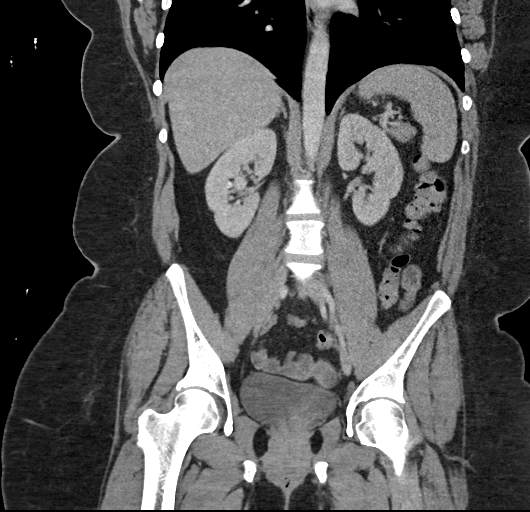

[17 of 46 positions shown; findings below may reference images not displayed]

RADIATION DOSE REDUCTION: This exam was performed according to the
departmental dose-optimization program which includes automated
exposure control, adjustment of the mA and/or kV according to
patient size and/or use of iterative reconstruction technique.

CONTRAST:  100mL OMNIPAQUE IOHEXOL 300 MG/ML  SOLN
FINDINGS: Lower chest: No acute abnormality.

Hepatobiliary: No focal liver abnormality is seen. Status post
cholecystectomy. No biliary dilatation.

Pancreas: Unremarkable. No pancreatic ductal dilatation or
surrounding inflammatory changes.

Spleen: Normal in size without focal abnormality.

Adrenals/Urinary Tract: Adrenal glands are unremarkable. Kidneys are
normal, without renal calculi, focal lesion, or hydronephrosis.
Bladder is unremarkable.

Stomach/Bowel: Stomach is within normal limits. Appendix appears
normal. No evidence of bowel wall thickening, distention, or
inflammatory changes. Postsurgical changes at the rectosigmoid
colon.

Vascular/Lymphatic: Nonaneurysmal aorta.  No suspicious lymph nodes

Reproductive: Uterus and right adnexa are unremarkable. Left adnexal
cystic lesion measures 4.3 by 3.1 cm.

Other: Negative for pelvic effusion or free air. Small fat
containing umbilical hernia.

Musculoskeletal: No acute or significant osseous findings.
IMPRESSION: 1. No CT evidence for acute intra-abdominal or pelvic abnormality.
2. 4.3 cm non simple left adnexal cystic lesion. Evaluation with
pelvic ultrasound recommended, this may be performed on a non
emergent basis unless otherwise clinically indicated.

## 2023-10-08 ENCOUNTER — Encounter (HOSPITAL_BASED_OUTPATIENT_CLINIC_OR_DEPARTMENT_OTHER): Payer: Self-pay | Admitting: Emergency Medicine

## 2023-10-08 ENCOUNTER — Emergency Department (HOSPITAL_BASED_OUTPATIENT_CLINIC_OR_DEPARTMENT_OTHER)
Admission: EM | Admit: 2023-10-08 | Discharge: 2023-10-08 | Disposition: A | Discharge status: Home / Self Care | Attending: Emergency Medicine | Admitting: Emergency Medicine

## 2023-10-08 ENCOUNTER — Emergency Department (HOSPITAL_BASED_OUTPATIENT_CLINIC_OR_DEPARTMENT_OTHER)

## 2023-10-08 ENCOUNTER — Other Ambulatory Visit: Payer: Self-pay

## 2023-10-08 DIAGNOSIS — R1031 Right lower quadrant pain: Secondary | ICD-10-CM | POA: Insufficient documentation

## 2023-10-08 DIAGNOSIS — R102 Pelvic and perineal pain: Secondary | ICD-10-CM | POA: Insufficient documentation

## 2023-10-08 DIAGNOSIS — J45909 Unspecified asthma, uncomplicated: Secondary | ICD-10-CM | POA: Diagnosis not present

## 2023-10-08 DIAGNOSIS — R112 Nausea with vomiting, unspecified: Secondary | ICD-10-CM | POA: Insufficient documentation

## 2023-10-08 DIAGNOSIS — Z7984 Long term (current) use of oral hypoglycemic drugs: Secondary | ICD-10-CM | POA: Diagnosis not present

## 2023-10-08 LAB — COMPREHENSIVE METABOLIC PANEL WITH GFR
ALT: 33 U/L (ref 0–44)
AST: 25 U/L (ref 15–41)
Albumin: 4.8 g/dL (ref 3.5–5.0)
Alkaline Phosphatase: 64 U/L (ref 38–126)
Anion gap: 9 (ref 5–15)
BUN: 14 mg/dL (ref 6–20)
CO2: 23 mmol/L (ref 22–32)
Calcium: 9.1 mg/dL (ref 8.9–10.3)
Chloride: 106 mmol/L (ref 98–111)
Creatinine, Ser: 1.09 mg/dL — ABNORMAL HIGH (ref 0.44–1.00)
GFR, Estimated: >60 mL/min (ref >60)
Glucose, Bld: 104 mg/dL — ABNORMAL HIGH (ref 70–99)
Potassium: 4.1 mmol/L (ref 3.5–5.1)
Sodium: 138 mmol/L (ref 135–145)
Total Bilirubin: 0.4 mg/dL (ref 0.0–1.2)
Total Protein: 7 g/dL (ref 6.5–8.1)

## 2023-10-08 LAB — URINALYSIS, W/ REFLEX TO CULTURE (INFECTION SUSPECTED)
Bilirubin Urine: NEGATIVE
Glucose, UA: NEGATIVE mg/dL
Hgb urine dipstick: NEGATIVE
Ketones, ur: NEGATIVE mg/dL
Nitrite: NEGATIVE
Protein, ur: NEGATIVE mg/dL
Specific Gravity, Urine: 1.009 (ref 1.005–1.030)
pH: 5.5 (ref 5.0–8.0)

## 2023-10-08 LAB — CBC
HCT: 41.6 % (ref 36.0–46.0)
Hemoglobin: 13.8 g/dL (ref 12.0–15.0)
MCH: 28.2 pg (ref 26.0–34.0)
MCHC: 33.2 g/dL (ref 30.0–36.0)
MCV: 85.1 fL (ref 80.0–100.0)
Platelets: 417 10*3/uL — ABNORMAL HIGH (ref 150–400)
RBC: 4.89 MIL/uL (ref 3.87–5.11)
RDW: 12.8 % (ref 11.5–15.5)
WBC: 9.9 10*3/uL (ref 4.0–10.5)
nRBC: 0 % (ref 0.0–0.2)

## 2023-10-08 LAB — LIPASE, BLOOD: Lipase: 17 U/L (ref 11–51)

## 2023-10-08 LAB — HCG, SERUM, QUALITATIVE: Preg, Serum: NEGATIVE

## 2023-10-08 MED ORDER — SODIUM CHLORIDE 0.9 % IV BOLUS
1000.0000 mL | Freq: Once | INTRAVENOUS | Status: AC
  Administered 2023-10-08: 1000 mL via INTRAVENOUS

## 2023-10-08 MED ORDER — FENTANYL CITRATE PF 50 MCG/ML IJ SOSY
50.0000 ug | PREFILLED_SYRINGE | Freq: Once | INTRAMUSCULAR | Status: AC
  Administered 2023-10-08: 50 ug via INTRAVENOUS
  Filled 2023-10-08: qty 1

## 2023-10-08 MED ORDER — KETOROLAC TROMETHAMINE 30 MG/ML IJ SOLN
30.0000 mg | Freq: Once | INTRAMUSCULAR | Status: AC
  Administered 2023-10-08: 30 mg via INTRAVENOUS
  Filled 2023-10-08: qty 1

## 2023-10-08 MED ORDER — OXYCODONE-ACETAMINOPHEN 5-325 MG PO TABS
1.0000 | ORAL_TABLET | Freq: Once | ORAL | Status: AC
  Administered 2023-10-08: 1 via ORAL
  Filled 2023-10-08: qty 1

## 2023-10-08 MED ORDER — ONDANSETRON HCL 4 MG/2ML IJ SOLN
4.0000 mg | Freq: Once | INTRAMUSCULAR | Status: AC
  Administered 2023-10-08: 4 mg via INTRAVENOUS
  Filled 2023-10-08: qty 2

## 2023-10-08 NOTE — ED Notes (Signed)
Pelvic exam ready at bedside. ?

## 2023-10-08 NOTE — Discharge Instructions (Signed)
 The test today in the ED did not show any signs of kidney stones or ovarian torsion.  Follow-up with your OB/GYN doctor to be rechecked if the symptoms persist.

## 2023-10-08 NOTE — ED Triage Notes (Signed)
 Pt POV reporting R side pelvic pain that began yesterday, stage 4 endometriosis, hx ovarian torsion, states it feels like same.  +n/v, denies fever.

## 2023-10-08 NOTE — ED Provider Notes (Signed)
 [place] EMERGENCY DEPARTMENT AT [facility] Provider Note   CSN: [id number] Arrival date & time: 10/08/23  1740     History  Chief Complaint  Patient presents with   Pelvic Pain    [name] [name] is a 38 y.o. female.   Pelvic Pain     Patient has a history of migraines asthma colitis diverticulitis acid reflux depression kidney stones endometriosis ulcerative colitis, ovarian torsion.  Patient states she started having sharp pain in her right lower abdomen that started yesterday.  Patient felt like it was a twisting inside her abdomen.  Patient has history of severe endometriosis as well as ovarian torsion.  She was concerned that her symptoms were very similar to her previous torsion.  Patient states she had surgery at that time but did not have an oophorectomy.  Patient has had some discomfort with urination but she attributes that to her endometriosis.  No fevers.  She has had nausea and vomiting  Home Medications Prior to Admission medications   Medication Sig Start Date End Date Taking? Authorizing Provider  acetaminophen (TYLENOL) 500 MG tablet Take 1,000 mg by mouth every 6 (six) hours as needed for headache.    [provider]  albuterol (PROVENTIL) (5 MG/ML) 0.5% nebulizer solution Inhale 1/2 vial (2.5 mg total) via nebulizer every 6 (six) hours as needed for wheezing or shortness of breath. 11/02/20   Rancour, Jeannett Senior, MD  amoxicillin-clavulanate (AUGMENTIN) 875-125 MG tablet Take 1 tablet by mouth 2 (two) times daily. 07/05/23   Waldon Merl, PA-C  azelastine (ASTELIN) 0.1 % nasal spray Spray 1-2 puffs in each nostril twice a day 11/29/20     benzonatate (TESSALON) 100 MG capsule Take 1 capsule (100 mg total) by mouth 3 (three) times daily as needed for cough. 07/05/23   Waldon Merl, PA-C  butorphanol (STADOL) 10 MG/ML nasal spray Use 1 spray in one nostril as needed every 6 hours for 30 days 07/16/21     cetirizine (ZYRTEC) 10 MG tablet Take 10  mg by mouth daily. Patient not taking: Reported on 01/03/2021    [provider]  clonazePAM (KLONOPIN) 0.5 MG tablet Take 0.25 mg by mouth at bedtime as needed for anxiety.    [provider]  cyclobenzaprine (FLEXERIL) 10 MG tablet Take 1 tablet (10 mg total) by mouth 3 (three) times daily as needed for muscle spasms. 03/31/19   Glyn Ade, Scot Jun, PA-C  diphenhydrAMINE (BENADRYL) 50 MG tablet Take 50 mg by mouth daily as needed for itching.    [provider]  EPINEPHrine 0.3 mg/0.3 mL IJ SOAJ injection Inject 0.3 mg into the muscle as needed for anaphylaxis. Patient taking differently: Inject 0.3 mg into the muscle once as needed for anaphylaxis. 10/15/20   Gilda Crease, MD  escitalopram (LEXAPRO) 20 MG tablet Take 20 mg by mouth daily.    [provider]  famotidine (PEPCID) 20 MG tablet Take 1 tablet (20 mg total) by mouth 2 (two) times daily for 7 days. 10/19/20 10/26/20  Jerald Kief, MD  fluticasone (FLONASE) 50 MCG/ACT nasal spray Place 2 sprays into both nostrils daily. 09/23/17   Waldon Merl, PA-C  fluticasone furoate-vilanterol (BREO ELLIPTA) 200-25 MCG/ACT AEPB Inhale into the lungs once daily as instructed. 05/02/21     fluticasone-salmeterol (ADVAIR HFA) 115-21 MCG/ACT inhaler Inhale 2 puffs by mouth into the lungs twice a day 10/23/20     hydrOXYzine (ATARAX) 10 MG tablet Take 1 tablet (10 mg total)  by mouth every 8 (eight) hours as needed. 06/13/21     ibuprofen (ADVIL) 200 MG tablet Take 800 mg by mouth every 6 (six) hours as needed for headache.    [provider]  ipratropium (ATROVENT) 0.03 % nasal spray 2 sprays per nostril 2-3 times daily as needed for congestion and post-nasal drainage 10/09/20   Lurene Shadow, PA-C  ketorolac (TORADOL) 10 MG tablet Take 1 tablet (10 mg total) by mouth every 6 (six) hours as needed. 09/25/22   Small, Brooke L, PA  labetalol (NORMODYNE) 200 MG tablet Take 200 mg by mouth 2 (two) times  daily.    [provider]  Lasmiditan Succinate (REYVOW) 100 MG TABS Take 100 mg by mouth as needed. Patient not taking: Reported on 01/03/2021 08/11/19   Glyn Ade, Scot Jun, PA-C  levalbuterol [facility] HFA) 45 MCG/ACT inhaler Inhale 2 puffs into the lungs every 4 (four) hours as needed for wheezing.    [provider]  levocetirizine (XYZAL) 5 MG tablet Take 1 tablet by mouth every evening. 05/16/21     metFORMIN (GLUCOPHAGE) 500 MG tablet Take 1,000 mg by mouth 2 (two) times daily. 01/26/19   [provider]  methocarbamol (ROBAXIN) 750 MG tablet Take 1-2 tablets (750-1,500 mg total) by mouth every 8 (eight) hours as needed for muscle spasms and pain. 12/26/20   Logan Bores, MD  montelukast (SINGULAIR) 10 MG tablet Take 1 tablet (10 mg total) by mouth daily. 11/29/20     naproxen (NAPROSYN) 500 MG tablet Take 1 tablet (500 mg total) by mouth 2 (two) times daily. 10/13/21   Kommor, Madison, MD  ondansetron (ZOFRAN) 8 MG tablet Take 1 tablet (8 mg total) by mouth every 6 (six) hours as needed for nausea or vomiting. 12/25/22   Prosperi, Christian H, PA-C  oxyCODONE-acetaminophen (PERCOCET/ROXICET) 5-325 MG tablet Take 1 tablet by mouth every 6 (six) hours as needed for severe pain. 12/25/22   Prosperi, Christian H, PA-C  pantoprazole (PROTONIX) 20 MG tablet Take 1 tablet (20 mg total) by mouth daily. Patient taking differently: Take 20 mg by mouth at bedtime. 05/04/15   Barrett Henle, PA-C  pantoprazole (PROTONIX) 20 MG tablet TAKE 1 TABLET BY MOUTH ONCE DAILY (NEEDS OFFICE VISIT) 06/13/21     prochlorperazine (COMPAZINE) 25 MG suppository Place 1 suppository (25 mg total) rectally every 12 (twelve) hours as needed for nausea or vomiting. 03/06/22   Cardama, Amadeo Garnet, MD  promethazine (PHENERGAN) 25 MG suppository Place 1 suppository (25 mg total) rectally every 6 (six) hours as needed for nausea or vomiting. 09/25/22   Small, Brooke L, PA  Rimegepant Sulfate  (NURTEC) 75 MG TBDP Take 75 mg by mouth as needed (migraine). Patient taking differently: Take 75 mg by mouth daily as needed (migraine). 03/31/19   Teague Chestine Spore, Scot Jun, PA-C  TOSYMRA 10 MG/ACT SOLN Inject 10 mg into the skin daily as needed (migraine). 09/04/20   [provider]      Allergies    Mushroom extract complex (obsolete), Tylenol with codeine #3 [acetaminophen-codeine], Betadine [povidone iodine], and Povidone-iodine    Review of Systems   Review of Systems  Genitourinary:  Positive for pelvic pain.    Physical Exam Updated Vital Signs BP (!) 141/75 (BP Location: Right Arm)   Pulse 84   Temp 99.4 F (37.4 C) (Oral)   Resp 18   Ht 1.575 m (5\' 2" )   Wt 113.4 kg   SpO2 100%   BMI  45.73 kg/m  Physical Exam Vitals and nursing note reviewed.  Constitutional:      Appearance: She is well-developed. She is ill-appearing.  HENT:     Head: Normocephalic and atraumatic.     Right Ear: External ear normal.     Left Ear: External ear normal.  Eyes:     General: No scleral icterus.       Right eye: No discharge.        Left eye: No discharge.     Conjunctiva/sclera: Conjunctivae normal.  Neck:     Trachea: No tracheal deviation.  Cardiovascular:     Rate and Rhythm: Normal rate and regular rhythm.  Pulmonary:     Effort: Pulmonary effort is normal. No respiratory distress.     Breath sounds: Normal breath sounds. No stridor. No wheezing or rales.  Abdominal:     General: Bowel sounds are normal. There is no distension.     Palpations: Abdomen is soft.     Tenderness: There is abdominal tenderness in the right lower quadrant. There is guarding. There is no rebound.  Genitourinary:    Exam position: Supine.     Labia:        Right: No rash, tenderness or lesion.        Left: No rash, tenderness or lesion.      Vagina: Normal.     Cervix: No cervical motion tenderness or discharge.     Uterus: Not enlarged and not tender.      Adnexa:        Right:  Tenderness present. No mass or fullness.         Left: No mass, tenderness or fullness.    Musculoskeletal:        General: No tenderness or deformity.     Cervical back: Neck supple.  Skin:    General: Skin is warm and dry.     Findings: No rash.  Neurological:     General: No focal deficit present.     Mental Status: She is alert.     Cranial Nerves: No cranial nerve deficit, dysarthria or facial asymmetry.     Sensory: No sensory deficit.     Motor: No abnormal muscle tone or seizure activity.     Coordination: Coordination normal.  Psychiatric:        Mood and Affect: Mood normal.     ED Results / Procedures / Treatments   Labs (all labs ordered are listed, but only abnormal results are displayed) Labs Reviewed  CBC - Abnormal; Notable for the following components:      Result Value   Platelets 417 (*)    All other components within normal limits  COMPREHENSIVE METABOLIC PANEL WITH GFR - Abnormal; Notable for the following components:   Glucose, Bld 104 (*)    Creatinine, Ser 1.09 (*)    All other components within normal limits  URINALYSIS, W/ REFLEX TO CULTURE (INFECTION SUSPECTED) - Abnormal; Notable for the following components:   Leukocytes,Ua MODERATE (*)    Bacteria, UA RARE (*)    All other components within normal limits  URINE CULTURE  LIPASE, BLOOD  HCG, SERUM, QUALITATIVE    EKG None  Radiology CT Renal Stone Study Result Date: 10/08/2023 CLINICAL DATA:  Abdominal and flank pain EXAM: CT ABDOMEN AND PELVIS WITHOUT CONTRAST TECHNIQUE: Multidetector CT imaging of the abdomen and pelvis was performed following the standard protocol without IV contrast. RADIATION DOSE REDUCTION: This exam was performed according to the departmental  dose-optimization program which includes automated exposure control, adjustment of the mA and/or kV according to patient size and/or use of iterative reconstruction technique. COMPARISON:  Ultrasound pelvis 10/08/2023. CT abdomen  and pelvis 12/25/2022. FINDINGS: Lower chest: No acute abnormality. Hepatobiliary: No focal liver abnormality is seen. Status post cholecystectomy. No biliary dilatation. Pancreas: Unremarkable. No pancreatic ductal dilatation or surrounding inflammatory changes. Spleen: Normal in size without focal abnormality. Adrenals/Urinary Tract: Adrenal glands are unremarkable. Kidneys are normal, without renal calculi, focal lesion, or hydronephrosis. Bladder is unremarkable. Stomach/Bowel: Stomach is within normal limits. Appendix appears normal. No evidence of bowel wall thickening, distention, or inflammatory changes. Sigmoid colon anastomosis is present. Vascular/Lymphatic: No significant vascular findings are present. No enlarged abdominal or pelvic lymph nodes. Reproductive: Uterus and bilateral adnexa are unremarkable. Other: No abdominal wall hernia or abnormality. No abdominopelvic ascites. Musculoskeletal: No acute or significant osseous findings. IMPRESSION: 1. No acute localizing process in the abdomen or pelvis. 2. Status post cholecystectomy and sigmoid colon resection. Electronically Signed   By: Darliss Cheney M.D.   On: 10/08/2023 20:48   US PELVIC COMPLETE W TRANSVAGINAL AND TORSION R/O Result Date: 10/08/2023 CLINICAL DATA:  Pelvic pain.  History of endometriosis. EXAM: TRANSABDOMINAL AND TRANSVAGINAL ULTRASOUND OF PELVIS DOPPLER ULTRASOUND OF OVARIES TECHNIQUE: Both transabdominal and transvaginal ultrasound examinations of the pelvis were performed. Transabdominal technique was performed for global imaging of the pelvis including uterus, ovaries, adnexal regions, and pelvic cul-de-sac. It was necessary to proceed with endovaginal exam following the transabdominal exam to visualize the endometrium and ovaries. Color and duplex Doppler ultrasound was utilized to evaluate blood flow to the ovaries. COMPARISON:  pelvic ultrasound 09/25/2022.  CT 12/25/2022 FINDINGS: Uterus Measurements: 5.7 x 2.8 x 3.2 cm  = volume: 26 mL. The uterus is anteverted. No fibroids or other mass visualized. Endometrium Thickness: 4 mm, normal.  No focal abnormality visualized. Right ovary Not visualized.  No adnexal mass. Left ovary Measurements: 2.4 x 1.7 x 1.8 cm = volume: 3.9 mL. Normal appearance. Physiologic follicle. Ovarian blood flow is demonstrated. No cystic or solid lesion. No adnexal mass. Pulsed Doppler evaluation of the left ovary demonstrates normal low-resistance arterial and venous waveforms. Other findings No abnormal free fluid. IMPRESSION: 1. Normal sonographic appearance of the uterus and endometrium. 2. Normal sonographic appearance of the left ovary. The right ovary is not visualized. Electronically Signed   By: Narda Rutherford M.D.   On: 10/08/2023 19:06    Procedures Procedures    Medications Ordered in ED Medications  sodium chloride 0.9 % bolus 1,000 mL (0 mLs Intravenous Stopped 10/08/23 2155)  ketorolac (TORADOL) 30 MG/ML injection 30 mg (30 mg Intravenous Given 10/08/23 1922)  ondansetron (ZOFRAN) injection 4 mg (4 mg Intravenous Given 10/08/23 1922)  fentaNYL (SUBLIMAZE) injection 50 mcg (50 mcg Intravenous Given 10/08/23 2011)  oxyCODONE-acetaminophen (PERCOCET/ROXICET) 5-325 MG per tablet 1 tablet (1 tablet Oral Given 10/08/23 2213)    ED Course/ Medical Decision Making/ A&P Clinical Course as of 10/08/23 2344  Fri Oct 08, 2023  1909 Ultrasound shows normal appearance of the uterus and endometrium.  Right ovary is not visualized.  Left ovary is normal [JK]  2001 CBC metabolic panel lipase all normal [JK]  2008 Pelvic exam completed [JK]  2124 CT scan without acute abnormality [JK]  2143 PDMP reviewed.  Patient was given a 30-day prescription of oxycodone on March 3 [JK]    Clinical Course User Index [JK] Linwood Dibbles, MD  Medical Decision Making Amount and/or Complexity of Data Reviewed Labs: ordered. Radiology: ordered.  Risk Prescription drug  management.   Patient presented to the ED with right lower quadrant abdominal pain.  Patient was concerned about the possibility of ovarian torsion.  Patient noted to have right tenderness.  Ultrasound did not show any evidence of ovarian torsion.  Right ovary not visualized so cannot completely exclude torsion but overall low suspicion at this time.  CT scan did not show any signs of ureterolithiasis or other acute abnormality.  I suspect patient's symptoms most likely related to her chronic pelvic pain and endometriosis.  Evaluation and diagnostic testing in the emergency department does not suggest an emergent condition requiring admission or immediate intervention beyond what has been performed at this time.  The patient is safe for discharge and has been instructed to return immediately for worsening symptoms, change in symptoms or any other concerns.        Final Clinical Impression(s) / ED Diagnoses Final diagnoses:  Pelvic pain    Rx / DC Orders ED Discharge Orders     None         Linwood Dibbles, MD 10/08/23 2346

## 2023-10-10 LAB — URINE CULTURE: Culture: 4000 — AB

## 2023-10-11 ENCOUNTER — Telehealth (HOSPITAL_BASED_OUTPATIENT_CLINIC_OR_DEPARTMENT_OTHER): Payer: Self-pay | Admitting: *Deleted

## 2023-10-11 NOTE — Telephone Encounter (Signed)
 Post ED Visit - Positive Culture Follow-up  Culture report reviewed by antimicrobial stewardship pharmacist: [facility] []  Enzo Bi, Pharm.D. []  Celedonio Miyamoto, Pharm.D., BCPS AQ-ID []  Garvin Fila, Pharm.D., BCPS []  Georgina Pillion, Pharm.D., BCPS []  Williams, [place].D., BCPS, AAHIVP []  Estella Husk, Pharm.D., BCPS, AAHIVP []  Lysle Pearl, PharmD, BCPS []  Phillips Climes, PharmD, BCPS []  Agapito Games, PharmD, BCPS []  Verlan Friends, PharmD []  Mervyn Gay, PharmD, BCPS []  Vinnie Level, PharmD  [facility] [x]  Ruben Im PharmD []  Greer Pickerel, PharmD []  Adalberto Cole, PharmD []  Perlie Gold, Rph []  Lonell Face) Jean Rosenthal, PharmD []  Earl Many, PharmD []  Junita Push, PharmD []  Dorna Leitz, PharmD []  Terrilee Files, PharmD []  Lynann Beaver, PharmD []  Keturah Barre, PharmD []  Loralee Pacas, PharmD []  Bernadene Person, PharmD  No Antibiotics prescribe and no further patient follow-up is required at this time.  Nena Polio Garner Nash 10/11/2023, 9:30 AM

## 2023-11-10 ENCOUNTER — Other Ambulatory Visit (HOSPITAL_BASED_OUTPATIENT_CLINIC_OR_DEPARTMENT_OTHER): Payer: Self-pay

## 2024-01-10 ENCOUNTER — Other Ambulatory Visit: Payer: Self-pay | Admitting: Family Medicine

## 2024-01-10 DIAGNOSIS — R4781 Slurred speech: Secondary | ICD-10-CM

## 2024-01-18 DIAGNOSIS — M542 Cervicalgia: Secondary | ICD-10-CM | POA: Diagnosis not present

## 2024-01-18 DIAGNOSIS — G43719 Chronic migraine without aura, intractable, without status migrainosus: Secondary | ICD-10-CM | POA: Diagnosis not present

## 2024-01-20 DIAGNOSIS — R198 Other specified symptoms and signs involving the digestive system and abdomen: Secondary | ICD-10-CM | POA: Diagnosis not present

## 2024-01-20 DIAGNOSIS — R1010 Upper abdominal pain, unspecified: Secondary | ICD-10-CM | POA: Diagnosis not present

## 2024-01-20 DIAGNOSIS — K219 Gastro-esophageal reflux disease without esophagitis: Secondary | ICD-10-CM | POA: Diagnosis not present

## 2024-01-20 DIAGNOSIS — R112 Nausea with vomiting, unspecified: Secondary | ICD-10-CM | POA: Diagnosis not present

## 2024-02-08 DIAGNOSIS — G894 Chronic pain syndrome: Secondary | ICD-10-CM | POA: Diagnosis not present

## 2024-02-11 ENCOUNTER — Other Ambulatory Visit (HOSPITAL_COMMUNITY): Payer: Self-pay | Admitting: Gastroenterology

## 2024-02-11 DIAGNOSIS — R112 Nausea with vomiting, unspecified: Secondary | ICD-10-CM | POA: Diagnosis not present

## 2024-02-11 DIAGNOSIS — K2289 Other specified disease of esophagus: Secondary | ICD-10-CM | POA: Diagnosis not present

## 2024-02-11 DIAGNOSIS — K2970 Gastritis, unspecified, without bleeding: Secondary | ICD-10-CM | POA: Diagnosis not present

## 2024-02-11 DIAGNOSIS — K2930 Chronic superficial gastritis without bleeding: Secondary | ICD-10-CM | POA: Diagnosis not present

## 2024-02-11 DIAGNOSIS — K219 Gastro-esophageal reflux disease without esophagitis: Secondary | ICD-10-CM | POA: Diagnosis not present

## 2024-02-13 DIAGNOSIS — R4781 Slurred speech: Secondary | ICD-10-CM | POA: Diagnosis not present

## 2024-02-14 DIAGNOSIS — G894 Chronic pain syndrome: Secondary | ICD-10-CM | POA: Diagnosis not present

## 2024-02-14 DIAGNOSIS — I10 Essential (primary) hypertension: Secondary | ICD-10-CM | POA: Diagnosis not present

## 2024-02-14 DIAGNOSIS — G43909 Migraine, unspecified, not intractable, without status migrainosus: Secondary | ICD-10-CM | POA: Diagnosis not present

## 2024-02-14 DIAGNOSIS — F418 Other specified anxiety disorders: Secondary | ICD-10-CM | POA: Diagnosis not present

## 2024-02-15 DIAGNOSIS — L301 Dyshidrosis [pompholyx]: Secondary | ICD-10-CM | POA: Diagnosis not present

## 2024-02-21 DIAGNOSIS — F411 Generalized anxiety disorder: Secondary | ICD-10-CM | POA: Diagnosis not present

## 2024-02-21 DIAGNOSIS — Z7189 Other specified counseling: Secondary | ICD-10-CM | POA: Diagnosis not present

## 2024-02-22 DIAGNOSIS — G894 Chronic pain syndrome: Secondary | ICD-10-CM | POA: Diagnosis not present

## 2024-02-24 ENCOUNTER — Encounter (HOSPITAL_COMMUNITY): Payer: Self-pay

## 2024-02-24 ENCOUNTER — Encounter (HOSPITAL_COMMUNITY)
Admission: RE | Admit: 2024-02-24 | Discharge: 2024-02-24 | Disposition: A | Discharge status: Home / Self Care | Source: Ambulatory Visit | Attending: Gastroenterology | Admitting: Gastroenterology

## 2024-02-24 DIAGNOSIS — R6881 Early satiety: Secondary | ICD-10-CM | POA: Diagnosis not present

## 2024-02-24 DIAGNOSIS — R112 Nausea with vomiting, unspecified: Secondary | ICD-10-CM | POA: Insufficient documentation

## 2024-02-24 DIAGNOSIS — R140 Abdominal distension (gaseous): Secondary | ICD-10-CM | POA: Diagnosis not present

## 2024-02-24 DIAGNOSIS — R110 Nausea: Secondary | ICD-10-CM | POA: Diagnosis not present

## 2024-02-24 DIAGNOSIS — K30 Functional dyspepsia: Secondary | ICD-10-CM | POA: Diagnosis not present

## 2024-02-24 MED ORDER — TECHNETIUM TC 99M SULFUR COLLOID
2.0000 | Freq: Once | INTRAVENOUS | Status: AC | PRN
  Administered 2024-02-24: 2.2 via ORAL

## 2024-03-01 DIAGNOSIS — K219 Gastro-esophageal reflux disease without esophagitis: Secondary | ICD-10-CM | POA: Diagnosis not present

## 2024-03-01 DIAGNOSIS — K3184 Gastroparesis: Secondary | ICD-10-CM | POA: Diagnosis not present

## 2024-03-01 DIAGNOSIS — K5900 Constipation, unspecified: Secondary | ICD-10-CM | POA: Diagnosis not present

## 2024-04-04 NOTE — Progress Notes (Signed)
 Location Information: Patient State (at time of visit): [place]  Patient Location (at time of visit):Home/Other Non-Medical  Provider Location: Non-Provider-Based Clinic (Clinic, non-hospital) Is provider licensed to provide clinical care in the current location/state of the patient? Yes   Consent:  Patient's identity was confirmed. Presenting condition or illness was discussed with the patient/personal representative. Current proposed treatment for presenting condition or illness was explained to patient/personal representative along with the likely benefits and any significant risks or complications associated with the provision of treatment by audio/video means. The patient/personal representative verbally authorized treatment to be provided by audio/video, which may include a limited review of patient's current health status, medication, or other treatment recommendations, patient education, and an opportunity to ask questions about condition and treatment. Verbal Consent Granted by Patient/Personal Representative:Yes   Visit Information: Modality: 2-Way Real-Time Audio/Video  Video Start Time: 1125 Video Stop Time: 1132 Video Total Time: 7 min    MINIMALLY INVASIVE GYNECOLOGIC SURGERY FOLLOW-UP Date: 04/04/2024 Patient Name: [name] [name]  MRN: [id number]   ASSESSMENT/ PLAN:  BLENDA WISECUP is a 38 y.o. No obstetric history on file. female presenting for follow-up of pelvic floor myalgia and endometriosis.   Problem List Items Addressed This Visit   None Visit Diagnoses       Endometriosis    -  Primary     Myalgia of pelvic floor           Recommendations:  Assessment & Plan 1. Pelvic pain: - Tizanidine has been helpful in managing pelvic pain. - Aygestin helps with periods but there is still some breakthrough bleeding. Gabapentin  is also working well. - Discussed the cyclic nature of breakthrough bleeding and its impact on pelvic pain. - Aygestin  prescription for a 90-day supply with three refills will be sent to the pharmacy.  2. 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 - Advised to schedule an appointment with a new neurosurgeon and keep the clinic updated on any changes. Let pt know that if we can do anything for her, to let us  know and that she is in our thoughts.   Arianna Jobie) Nassar, MS, PA-C Minimally Invasive Gynecologic Surgery  ------------------------------------   HPI:  History of Present Illness The patient is a 38 year old female who presents via virtual visit for evaluation of pelvic pain.  She has been managing her pelvic pain with tizanidine, which she finds beneficial. Aygestin helps regulate her menstrual cycle, although she still experiences some breakthrough bleeding, which tends to exacerbate her pelvic pain. Her pelvic pain varies in intensity from month to month. Gabapentin  is also part of her treatment regimen and is well tolerated. She is currently on Aygestin 5 mg and is seeking a refill as her supply is running low.  Recently, she was diagnosed with a meningocele and a malignant neoplasm in her brain. She plans to schedule an appointment with a new neurosurgeon once her current health issues are resolved.    ROS: Pertinent items are noted in HPI.  Reviewed and updated PMH/ PSH/ All/ Meds/ SocHx/ FamHx/ Problem list as necessary  Current Medications[1]   Allergies[2]   COMPREHENSIVE GYNECOLOGIC PHYSICAL EXAMINATION: Physical Exam Physical Exam (limited exam due to video visit) Constitutional:  No obvious distress Eyes: EOMI Ears/Nose/Mouth/Throat: No obvious drainage per ears or nose; no facial hair, voice appropriate Cardiovascular: Not assessed due to video visit Respiratory: No audible wheezing/stridor; normal work of breathing Gastrointestinal:  Not assessed due to  video visit Musculoskeletal: Normal ROM of UEs Skin:  No obvious rashes/open sores (limited exam due to video visit) Neurologic: CN 2-12 grossly intact Psychiatric: Answered questions appropriately w/normal affect Hematologic/Lymphatic/Immunologic: No obvious bruises or sites of spontaneous bleeding (limited exam due to video visit) Genitourinary:  Not assessed due to video visit.   Physical Exam   RESULTS Results   LABS:  No results found for: WBC, RBC, HGB, HCT, PLT No results found for: FERRITIN  No results found for: GLU, NA, K, CL, CO2, BUN, CREAT, CA, PROT, ALB, AST, ALT No results found for: A1C No results found for: TSH No components found for: EST No results found for: FSH No components found for: PROLAC  No results found for: NITRITE  IMAGING: MRI Brain WO Contrast Narrative: MRI BRAIN WITHOUT CONTRAST, 02/13/2024 5:25 PM  INDICATION: Slurred speech \ R47.81 Slurred speech   COMPARISON: None  TECHNIQUE: Multiplanar, multi-sequence magnetic resonance imaging of the brain was performed without contrast.  FINDINGS:  Calvarium/skull base: No focal marrow replacing lesion. There is a lobulated T2 hyperintense abnormality centered in the medial aspect of the left pterygopalatine fossa that appears to extend towards the foramen rotundum and sphenopalatine foramen. Reference series 13, images 6 and 7; series 12, images 26-29).  Orbits: No mass lesion.  Paranasal sinuses: No air-fluid levels.  Brain: No evidence of acute infarct. No mass effect. No evidence of acute hemorrhage. No hydrocephalus. No significant white matter disease for the patient's age. Normal appearance of the major intracranial arteries and dural venous sinuses. There is a single T2/FLAIR hyperintense focus within the left frontal periventricular white matter which is nonspecific but not advanced for the patient's age. Impression: 1.  No acute intracranial abnormality. No acute infarct or other etiology for slurred  speech identified. 2.  Partially empty sella. This is nonspecific and can be seen as an anatomic variant or associated with idiopathic intracranial hypertension. 3.  Lobulated T2 hyperintense abnormality centered in the medial aspect of the left pterygopalatine fossa, with suspected extension along the left foramen rotundum. This is not completely characterized by noncontrast brain MRI. A primary differential consideration is a nerve sheath tumor such as a schwannoma. Follow-up MRI face with and without contrast is advised for further evaluation. A meningocele is another differential consideration in the setting of possible IIH, and advise that the follow-up scan include high-resolution T2/SPACE imaging to further assess for this possibility.    Ultrasound: === 08/12/23 ===  US  OR IMAGE  - Narrative - This order was created for film storage, please see performing providers notes for details.   MRI: === 02/13/24 ===  MRI BRAIN WO CONTRAST  - Narrative - MRI BRAIN WITHOUT CONTRAST, 02/13/2024 5:25 PM  INDICATION: Slurred speech \ R47.81 Slurred speech  COMPARISON: None  TECHNIQUE: Multiplanar, multi-sequence magnetic resonance imaging of the brain was performed without contrast.   FINDINGS:  Calvarium/skull base: No focal marrow replacing lesion. There is a lobulated T2 hyperintense abnormality centered in the medial aspect of the left pterygopalatine fossa that appears to extend towards the foramen rotundum and sphenopalatine foramen. Reference series 13, images 6 and 7; series 12, images 26-29).  Orbits: No mass lesion.  Paranasal sinuses: No air-fluid levels.  Brain: No evidence of acute infarct. No mass effect. No evidence of acute hemorrhage. No hydrocephalus. No significant white matter disease for the patient's age. Normal appearance of the major intracranial arteries and dural venous sinuses. There is a single T2/FLAIR hyperintense focus within the left frontal periventricular  white matter which is nonspecific but not advanced for the patient's age.  - Impression - 1.  No acute intracranial abnormality. No acute infarct or other etiology for slurred speech identified. 2.  Partially empty sella. This is nonspecific and can be seen as an anatomic variant or associated with idiopathic intracranial hypertension. 3.  Lobulated T2 hyperintense abnormality centered in the medial aspect of the left pterygopalatine fossa, with suspected extension along the left foramen rotundum. This is not completely characterized by noncontrast brain MRI. A primary differential consideration is a nerve sheath tumor such as a schwannoma. Follow-up MRI face with and without contrast is advised for further evaluation. A meningocele is another differential consideration in the setting of possible IIH, and advise that the follow-up scan include high-resolution T2/SPACE imaging to further assess for this possibility.   PATHOLOGY:  N/a  MEDICAL DECISION MAKING: I have personally spent 20 minutes involved in face-to-face and non-face-to-face activities for this patient on the day of the visit.  Professional time spent includes the following activities, in addition to those noted in the documentation: Preparing to see the patient (e.g., review of tests), Obtaining and/or reviewing separately obtained history, Performing a medically appropriate examination and/or evaluation, Counseling and educating the patient/family/caregiver, Care coordination (not separately recorded), Documenting clinical information in the electronic or other health record, Ordering medications, tests, or procedures, Referring and communicating with other health care professionals (when not separately reported), and Independently interpreting results (not separately reported) and communicating results to the patient/family/caregiver               [1] Current Outpatient Medications  Medication Sig Dispense Refill  .  acetaminophen  (TYLENOL ) 500 mg tablet Take 1,000 mg by mouth every 6 (six) hours as needed.    . cetirizine (ZyrTEC) 10 mg tablet Take 10 mg by mouth daily.    . chlorproMAZINE  (THORAZINE ) 25 mg tablet 1-2 TABS AS NEEDED FOR HEADACHE RESCUE MAY REPEAT EVERY 2-3 HOURS MAX 4-6 TABS/24 HOURS    . clonazePAM  (KlonoPIN ) 0.5 mg tablet Take 1 tablet by mouth daily as needed.    . diphenhydrAMINE  50 mg tab Take 50 mg by mouth at bedtime. (Patient taking differently: Take 50 mg by mouth as needed.)    . DULoxetine (CYMBALTA) 30 mg capsule Take 30 mg by mouth 2 (two) times a day.    . EPINEPHrine  (EPIPEN ) 0.3 mg/0.3 mL injection syringe INJECT 0.3 MG INTO THE MUSCLE AS NEEDED FOR ANAPHYLAXIS.    . gabapentin  (NEURONTIN ) 300 mg capsule Take 2 capsules (600 mg total) by mouth daily with breakfast AND 1 capsule (300 mg total) every evening AND 3 capsules (900 mg total) at bedtime. Do all this for 7 days. 42 capsule 0  . gabapentin  (NEURONTIN ) 300 mg capsule Take 2 capsules (600 mg total) by mouth 2 (two) times a day AND 3 capsules (900 mg total) at bedtime. 210 capsule 11  . ibuprofen  (MOTRIN ) 600 mg tablet Take 600 mg by mouth every 6 (six) hours as needed.    . labetaloL  (NORMODYNE ) 100 mg tablet TAKE 1 TABLET BY MOUTH TWICE A DAY for 30    . levalbuterol  (XOPENEX  HFA) 45 mcg/actuation inhaler Inhale 2 puffs every 4 (four) hours as needed.    . norethindrone (AYGESTIN) 5 mg tab Take 10 mg by mouth daily.    . ondansetron  (ZOFRAN ) 8 mg tablet 1 TABLET AS NEEDED ORALLY THREE TIMES DAILY 5 DAYS    . oxyCODONE -acetaminophen  (PERCOCET) 5-325 mg per tablet Take 1  tablet by mouth every 6 (six) hours as needed.    . pantoprazole  (PROTONIX ) 40 mg EC tablet TAKE 1 TABLET BY MOUTH TWICE A DAY FOR 15 DAYS. **INSURANCE WILL ONLY PAY FOR 1 TAB/DAY**    . promethazine  (PHENERGAN ) 25 mg suppository Insert 25 mg into the rectum as needed.    SABRA tiZANidine (ZANAFLEX) 4 mg tablet Take 1 tablet (4 mg total) by mouth every 8  (eight) hours as needed for muscle spasms. 30 tablet 3  . traZODone (DESYREL) 100 mg tablet Take 150 mg by mouth at bedtime.    SABRA zonisamide (ZONEGRAN) 100 mg capsule Take 150 mg by mouth daily.     No current facility-administered medications for this visit.  [2] Allergies Allergen Reactions  . Mushroom Anaphylaxis  . Nirmatrelvir-Ritonavir     Paxlovid  Instructed by a pharmacist to not take  . Acetaminophen -Codeine Palpitations  . Adhesive Rash    glue  . Povidone-Iodine Rash    Other Reaction(s): rash

## 2024-04-05 ENCOUNTER — Emergency Department (HOSPITAL_COMMUNITY)
Admission: EM | Admit: 2024-04-05 | Discharge: 2024-04-05 | Disposition: A | Discharge status: Home / Self Care | Source: Ambulatory Visit

## 2024-04-05 ENCOUNTER — Encounter (HOSPITAL_COMMUNITY): Payer: Self-pay

## 2024-04-05 ENCOUNTER — Other Ambulatory Visit: Payer: Self-pay

## 2024-04-05 ENCOUNTER — Emergency Department (HOSPITAL_COMMUNITY)

## 2024-04-05 DIAGNOSIS — R519 Headache, unspecified: Secondary | ICD-10-CM | POA: Diagnosis present

## 2024-04-05 DIAGNOSIS — J069 Acute upper respiratory infection, unspecified: Secondary | ICD-10-CM | POA: Insufficient documentation

## 2024-04-05 LAB — COMPREHENSIVE METABOLIC PANEL WITH GFR
ALT: 20 U/L (ref 0–44)
AST: 22 U/L (ref 15–41)
Albumin: 3.5 g/dL (ref 3.5–5.0)
Alkaline Phosphatase: 62 U/L (ref 38–126)
Anion gap: 9 (ref 5–15)
BUN: 12 mg/dL (ref 6–20)
CO2: 21 mmol/L — ABNORMAL LOW (ref 22–32)
Calcium: 9 mg/dL (ref 8.9–10.3)
Chloride: 108 mmol/L (ref 98–111)
Creatinine, Ser: 0.91 mg/dL (ref 0.44–1.00)
GFR, Estimated: >60 mL/min (ref >60)
Glucose, Bld: 106 mg/dL — ABNORMAL HIGH (ref 70–99)
Potassium: 4 mmol/L (ref 3.5–5.1)
Sodium: 138 mmol/L (ref 135–145)
Total Bilirubin: 0.5 mg/dL (ref 0.0–1.2)
Total Protein: 6.6 g/dL (ref 6.5–8.1)

## 2024-04-05 LAB — CBC WITH DIFFERENTIAL/PLATELET
Abs Immature Granulocytes: 0.05 K/uL (ref 0.00–0.07)
Basophils Absolute: 0.1 K/uL (ref 0.0–0.1)
Basophils Relative: 1 %
Eosinophils Absolute: 0.2 K/uL (ref 0.0–0.5)
Eosinophils Relative: 2 %
HCT: 37.9 % (ref 36.0–46.0)
Hemoglobin: 12.5 g/dL (ref 12.0–15.0)
Immature Granulocytes: 1 %
Lymphocytes Relative: 25 %
Lymphs Abs: 2.1 K/uL (ref 0.7–4.0)
MCH: 28.3 pg (ref 26.0–34.0)
MCHC: 33 g/dL (ref 30.0–36.0)
MCV: 85.7 fL (ref 80.0–100.0)
Monocytes Absolute: 0.5 K/uL (ref 0.1–1.0)
Monocytes Relative: 6 %
Neutro Abs: 5.5 K/uL (ref 1.7–7.7)
Neutrophils Relative %: 65 %
Platelets: 342 K/uL (ref 150–400)
RBC: 4.42 MIL/uL (ref 3.87–5.11)
RDW: 13.6 % (ref 11.5–15.5)
WBC: 8.5 K/uL (ref 4.0–10.5)
nRBC: 0 % (ref 0.0–0.2)

## 2024-04-05 LAB — RESP PANEL BY RT-PCR (RSV, FLU A&B, COVID)  RVPGX2
Influenza A by PCR: NEGATIVE
Influenza B by PCR: NEGATIVE
Resp Syncytial Virus by PCR: NEGATIVE
SARS Coronavirus 2 by RT PCR: NEGATIVE

## 2024-04-05 MED ORDER — TOPIRAMATE 50 MG PO TABS: ORAL_TABLET | ORAL | 2 refills | Status: AC

## 2024-04-05 MED ORDER — DIPHENHYDRAMINE HCL 50 MG/ML IJ SOLN
25.0000 mg | Freq: Once | INTRAMUSCULAR | Status: AC
  Administered 2024-04-05: 25 mg via INTRAVENOUS
  Filled 2024-04-05: qty 1

## 2024-04-05 MED ORDER — KETOROLAC TROMETHAMINE 15 MG/ML IJ SOLN
15.0000 mg | Freq: Once | INTRAMUSCULAR | Status: AC
  Administered 2024-04-05: 15 mg via INTRAVENOUS
  Filled 2024-04-05: qty 1

## 2024-04-05 MED ORDER — KETOROLAC TROMETHAMINE 15 MG/ML IJ SOLN
15.0000 mg | Freq: Once | INTRAMUSCULAR | Status: DC
  Filled 2024-04-05: qty 1

## 2024-04-05 MED ORDER — PROCHLORPERAZINE EDISYLATE 10 MG/2ML IJ SOLN
5.0000 mg | Freq: Once | INTRAMUSCULAR | Status: AC
  Administered 2024-04-05: 5 mg via INTRAVENOUS
  Filled 2024-04-05: qty 2

## 2024-04-05 MED ORDER — DEXAMETHASONE SODIUM PHOSPHATE 10 MG/ML IJ SOLN
8.0000 mg | Freq: Once | INTRAMUSCULAR | Status: AC
  Administered 2024-04-05: 8 mg via INTRAVENOUS
  Filled 2024-04-05: qty 1

## 2024-04-05 MED ORDER — MAGNESIUM SULFATE 2 GM/50ML IV SOLN
2.0000 g | Freq: Once | INTRAVENOUS | Status: AC
  Administered 2024-04-05: 2 g via INTRAVENOUS
  Filled 2024-04-05: qty 50

## 2024-04-05 MED ORDER — VALPROATE SODIUM 100 MG/ML IV SOLN
1000.0000 mg | Freq: Once | INTRAVENOUS | Status: AC
  Administered 2024-04-05: 1000 mg via INTRAVENOUS
  Filled 2024-04-05: qty 10

## 2024-04-05 MED ORDER — SODIUM CHLORIDE 0.9 % IV BOLUS
1000.0000 mL | Freq: Once | INTRAVENOUS | Status: AC
  Administered 2024-04-05: 1000 mL via INTRAVENOUS

## 2024-04-05 NOTE — ED Notes (Signed)
 Assuming pt care, pt walk in for headache, nausea, dizziness x 3 day, intermittent headache x 1 month+, pt endorses brain tumor, med hx. Htn, migraines. Pt aaox4, ambulatory, reports headache 7/10, pain meds given. Call bell within reach, family at bedside.

## 2024-04-05 NOTE — ED Triage Notes (Signed)
 Patient has hx of known brain tumor x 6 weeks, reports headache with no relief x 3 days with pressure and tightness and sharp pain across the top. Patient is A&O at registration.

## 2024-04-05 NOTE — ED Provider Notes (Signed)
 [place] EMERGENCY DEPARTMENT AT [facility] Provider Note   CSN: [id number] Arrival date & time: 04/05/24  1414     Patient presents with: Headache and Brain Tumor   [name] [name] is a 38 y.o. female.    Headache   Presents because a headache.  Patient states that recent was told that she may have a brain tumor.  Has a history of migraines but having a little bit worse headache at this point time so came to ED for further evaluation.  Patient states that hurts on both sides of her head.  She has 0 vision changes.  No diplopia.  No black spots in her vision.  Denies all visual complaints.  Just endorsing a headache.  She states that her husband had a upper respiratory infection a couple days ago with a fever at home.  Subsequently resolved.  He had a cough at that time.  She is endorsing a mild cough.  Nonproductive.  Had a temperature of slightly above 100 degrees earlier today and took Tylenol  that subsequently resolved the fever.  No neck pain.  No pain with flexion of the neck.     Previous medical history reviewed : Prior imaging on September 5.  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  Recommended repeat facial MRI in 3 months.   Prior to Admission medications   Medication Sig Start Date End Date Taking? Authorizing Provider  topiramate  (TOPAMAX ) 50 MG tablet Take 1 tablet (50 mg total) by mouth at bedtime for 7 days, THEN 1 tablet (50 mg total) 2 (two) times daily. 04/05/24 05/12/24 Yes Simon Lavonia SAILOR, MD  acetaminophen  (TYLENOL ) 500 MG tablet Take 1,000 mg by mouth every 6 (six) hours as needed for headache.    [provider]  albuterol  (PROVENTIL ) (5 MG/ML) 0.5% nebulizer solution Inhale 1/2 vial (2.5 mg total) via nebulizer every 6 (six) hours as needed for wheezing or shortness of breath. 11/02/20   Rancour, Garnette, MD  amoxicillin -clavulanate (AUGMENTIN ) 875-125 MG tablet Take 1 tablet by mouth 2 (two) times daily. 07/05/23    Gladis Elsie BROCKS, PA-C  azelastine  (ASTELIN ) 0.1 % nasal spray Spray 1-2 puffs in each nostril twice a day 11/29/20     benzonatate  (TESSALON ) 100 MG capsule Take 1 capsule (100 mg total) by mouth 3 (three) times daily as needed for cough. 07/05/23   Gladis Elsie BROCKS, PA-C  butorphanol  (STADOL ) 10 MG/ML nasal spray Use 1 spray in one nostril as needed every 6 hours for 30 days 07/16/21     cetirizine (ZYRTEC) 10 MG tablet Take 10 mg by mouth daily. Patient not taking: Reported on 01/03/2021    [provider]  clonazePAM  (KLONOPIN ) 0.5 MG tablet Take 0.25 mg by mouth at bedtime as needed for anxiety.    [provider]  cyclobenzaprine  (FLEXERIL ) 10 MG tablet Take 1 tablet (10 mg total) by mouth 3 (three) times daily as needed for muscle spasms. 03/31/19   Nance Gaskins, Darice BRAVO, PA-C  diphenhydrAMINE  (BENADRYL ) 50 MG tablet Take 50 mg by mouth daily as needed for itching.    [provider]  EPINEPHrine  0.3 mg/0.3 mL IJ SOAJ injection Inject 0.3 mg into the muscle as needed for anaphylaxis. Patient taking differently: Inject 0.3 mg into the muscle once as needed for anaphylaxis. 10/15/20   Haze Lonni PARAS, MD  escitalopram  (LEXAPRO ) 20 MG tablet Take 20 mg by mouth daily.    [provider]  famotidine  (PEPCID ) 20 MG  tablet Take 1 tablet (20 mg total) by mouth 2 (two) times daily for 7 days. 10/19/20 10/26/20  Cindy Garnette POUR, MD  fluticasone  (FLONASE ) 50 MCG/ACT nasal spray Place 2 sprays into both nostrils daily. 09/23/17   Gladis Elsie BROCKS, PA-C  fluticasone  furoate-vilanterol (BREO ELLIPTA ) 200-25 MCG/ACT AEPB Inhale into the lungs once daily as instructed. 05/02/21     fluticasone -salmeterol (ADVAIR  HFA) 115-21 MCG/ACT inhaler Inhale 2 puffs by mouth into the lungs twice a day 10/23/20     hydrOXYzine  (ATARAX ) 10 MG tablet Take 1 tablet (10 mg total) by mouth every 8 (eight) hours as needed. 06/13/21     ibuprofen  (ADVIL ) 200 MG tablet Take 800 mg by mouth every  6 (six) hours as needed for headache.    [provider]  ipratropium (ATROVENT ) 0.03 % nasal spray 2 sprays per nostril 2-3 times daily as needed for congestion and post-nasal drainage 10/09/20   Anitra Rocky KIDD, PA-C  ketorolac  (TORADOL ) 10 MG tablet Take 1 tablet (10 mg total) by mouth every 6 (six) hours as needed. 09/25/22   Small, Brooke L, PA  labetalol  (NORMODYNE ) 200 MG tablet Take 200 mg by mouth 2 (two) times daily.    [provider]  Lasmiditan  Succinate (REYVOW ) 100 MG TABS Take 100 mg by mouth as needed. Patient not taking: Reported on 01/03/2021 08/11/19   Nance Gaskins, Darice BRAVO, PA-C  levalbuterol  (XOPENEX  HFA) 45 MCG/ACT inhaler Inhale 2 puffs into the lungs every 4 (four) hours as needed for wheezing.    [provider]  levocetirizine (XYZAL ) 5 MG tablet Take 1 tablet by mouth every evening. 05/16/21     metFORMIN (GLUCOPHAGE) 500 MG tablet Take 1,000 mg by mouth 2 (two) times daily. 01/26/19   [provider]  methocarbamol  (ROBAXIN ) 750 MG tablet Take 1-2 tablets (750-1,500 mg total) by mouth every 8 (eight) hours as needed for muscle spasms and pain. 12/26/20   Perley Ronal SAUNDERS, MD  montelukast  (SINGULAIR ) 10 MG tablet Take 1 tablet (10 mg total) by mouth daily. 11/29/20     naproxen  (NAPROSYN ) 500 MG tablet Take 1 tablet (500 mg total) by mouth 2 (two) times daily. 10/13/21   Kommor, Madison, MD  ondansetron  (ZOFRAN ) 8 MG tablet Take 1 tablet (8 mg total) by mouth every 6 (six) hours as needed for nausea or vomiting. 12/25/22   Prosperi, Christian H, PA-C  oxyCODONE -acetaminophen  (PERCOCET/ROXICET) 5-325 MG tablet Take 1 tablet by mouth every 6 (six) hours as needed for severe pain. 12/25/22   Prosperi, Christian H, PA-C  pantoprazole  (PROTONIX ) 20 MG tablet Take 1 tablet (20 mg total) by mouth daily. Patient taking differently: Take 20 mg by mouth at bedtime. 05/04/15   Nevelyn Nat Norris, PA-C  pantoprazole  (PROTONIX ) 20 MG tablet TAKE 1 TABLET BY  MOUTH ONCE DAILY (NEEDS OFFICE VISIT) 06/13/21     prochlorperazine  (COMPAZINE ) 25 MG suppository Place 1 suppository (25 mg total) rectally every 12 (twelve) hours as needed for nausea or vomiting. 03/06/22   Cardama, Raynell Moder, MD  promethazine  (PHENERGAN ) 25 MG suppository Place 1 suppository (25 mg total) rectally every 6 (six) hours as needed for nausea or vomiting. 09/25/22   Small, Brooke L, PA  Rimegepant Sulfate (NURTEC) 75 MG TBDP Take 75 mg by mouth as needed (migraine). Patient taking differently: Take 75 mg by mouth daily as needed (migraine). 03/31/19   Nance Gaskins, Darice BRAVO, PA-C  TOSYMRA  10 MG/ACT SOLN Inject 10 mg into the skin daily as needed (  migraine). 09/04/20   [provider]    Allergies: Mushroom extract complex (obsolete), Tylenol  with codeine #3 [acetaminophen -codeine], Betadine [povidone iodine], and Povidone-iodine    Review of Systems  Neurological:  Positive for headaches.    Updated Vital Signs BP (!) 159/96   Pulse 98   Temp 98.6 F (37 C) (Oral)   Resp (!) 23   Ht 5' 2 (1.575 m)   Wt 111.1 kg   SpO2 97%   BMI 44.81 kg/m   Physical Exam Vitals and nursing note reviewed.  Constitutional:      General: She is not in acute distress.    Appearance: She is well-developed.  HENT:     Head: Normocephalic and atraumatic.  Eyes:     General: No visual field deficit.    Conjunctiva/sclera: Conjunctivae normal.  Cardiovascular:     Rate and Rhythm: Normal rate and regular rhythm.     Heart sounds: No murmur heard. Pulmonary:     Effort: Pulmonary effort is normal. No respiratory distress.     Breath sounds: Normal breath sounds.  Abdominal:     Palpations: Abdomen is soft.     Tenderness: There is no abdominal tenderness.  Musculoskeletal:        General: No swelling.     Cervical back: Neck supple.  Skin:    General: Skin is warm and dry.     Capillary Refill: Capillary refill takes less than 2 seconds.  Neurological:     Mental  Status: She is alert and oriented to person, place, and time. Mental status is at baseline.     GCS: GCS eye subscore is 4. GCS verbal subscore is 5. GCS motor subscore is 6.     Cranial Nerves: No cranial nerve deficit, dysarthria or facial asymmetry.     Motor: No weakness.     Coordination: Romberg sign negative. Coordination normal.  Psychiatric:        Mood and Affect: Mood normal.     (all labs ordered are listed, but only abnormal results are displayed) Labs Reviewed  COMPREHENSIVE METABOLIC PANEL WITH GFR - Abnormal; Notable for the following components:      Result Value   CO2 21 (*)    Glucose, Bld 106 (*)    All other components within normal limits  RESP PANEL BY RT-PCR (RSV, FLU A&B, COVID)  RVPGX2  CBC WITH DIFFERENTIAL/PLATELET  HCG, SERUM, QUALITATIVE    EKG: EKG Interpretation Date/Time:  Wednesday April 05 2024 17:11:36 EDT Ventricular Rate:  92 PR Interval:  140 QRS Duration:  87 QT Interval:  352 QTC Calculation: 436 R Axis:   60  Text Interpretation: Sinus rhythm Confirmed by Simon Rea [phone] on 04/05/2024 5:32:20 PM  Radiology: CT Head Wo Contrast Result Date: 04/05/2024 CLINICAL DATA:  Headache, increasing frequency or severity. EXAM: CT HEAD WITHOUT CONTRAST TECHNIQUE: Contiguous axial images were obtained from the base of the skull through the vertex without intravenous contrast. RADIATION DOSE REDUCTION: This exam was performed according to the departmental dose-optimization program which includes automated exposure control, adjustment of the mA and/or kV according to patient size and/or use of iterative reconstruction technique. COMPARISON:  Reports from 02/13/2024 outside head MRI and 03/17/2024 face MRI (images not available) FINDINGS: Brain: There is no evidence of an acute infarct, intracranial hemorrhage, mass, midline shift, or extra-axial fluid collection. Cerebral volume is normal. The ventricles are normal in size. There is a partially  empty sella, also described in the prior outside head  MRI report. Vascular: No hyperdense vessel. Skull: No fracture. Mild, smooth enlargement/remodeling of left foramen rotundum with a lobulated cystic structure described in this location extending into the pterygoid palatine fossa in the prior outside MRI reports. No aggressive bone changes are evident by CT, and this is more suggestive of a benign/indolent process. Sinuses/Orbits: Visualized paranasal sinuses and mastoid air cells are clear. Unremarkable orbits. Other: None. IMPRESSION: 1. No evidence of acute intracranial abnormality. 2. Partially empty sella, often reflecting normal anatomic variation though can also be seen with idiopathic intracranial hypertension. 3. 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 Electronically Signed   By: Dasie Hamburg M.D.   On: 04/05/2024 15:44     Procedures   Medications Ordered in the ED  prochlorperazine  (COMPAZINE ) injection 5 mg (5 mg Intravenous Given 04/05/24 1728)  diphenhydrAMINE  (BENADRYL ) injection 25 mg (25 mg Intravenous Given 04/05/24 1729)  sodium chloride  0.9 % bolus 1,000 mL (1,000 mLs Intravenous New Bag/Given 04/05/24 1729)  dexamethasone  (DECADRON ) injection 8 mg (8 mg Intravenous Given 04/05/24 1746)  valproate (DEPACON ) 1,000 mg in dextrose  5 % 50 mL IVPB (0 mg Intravenous Stopped 04/05/24 2152)  magnesium  sulfate IVPB 2 g 50 mL (0 g Intravenous Stopped 04/05/24 2030)  ketorolac  (TORADOL ) 15 MG/ML injection 15 mg (15 mg Intravenous Given 04/05/24 1943)    Clinical Course as of 04/05/24 2252  Wed Apr 05, 2024  1852 IMPRESSION:  1. Reassuringly, enhancement is not prominent feature of the lobulated fluid signal structure centered within the left pterygopalatine fossa and foramen rotundum. Although nonspecific, meningocele could account for these findings. Low-grade neoplasm is not excluded. At minimum, repeat  facial MRI in 3 months to assess stability.  2.  The partially empty sella is nonspecific. Correlate for signs of idiopathic intracranial hypertension. Alternative considerations include glandular atrophy and sellar arachnoid cyst.     [TL]  1858 Gram depakote and magnesium   [TL]  2238 Dr. Vanessa: Topimax 50 nightly. After a week can 50 mg BID. May make you more sleepy. If that the case can take night dose and not morning dose. . Can start outpatient.  [TL]    Clinical Course User Index [TL] Simon Lavonia SAILOR, MD                                 Medical Decision Making Amount and/or Complexity of Data Reviewed Labs: ordered.  Risk Prescription drug management.   Presents because a headache.  Patient states that recent was told that she may have a brain tumor.  Has a history of migraines but having a little bit worse headache at this point time so came to ED for further evaluation.  Patient states that hurts on both sides of her head.  She has 0 vision changes.  No diplopia.  No black spots in her vision.  Denies all visual complaints.  Just endorsing a headache.  She states that her husband had a upper respiratory infection a couple days ago with a fever at home.  Subsequently resolved.  He had a cough at that time.  She is endorsing a mild cough.  Nonproductive.  Had a temperature of slightly above 100 degrees earlier today and took Tylenol  that subsequently resolved the fever.  No neck pain.  No pain with flexion of the neck.     Previous medical history reviewed : Prior imaging on  September 5.  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  Recommended repeat facial MRI in 3 months.    On exam, patient hemodynamically stable.   In terms of neuroexam, negative Kernig's and Brudzinski's.  Normal chin to chest.  No pain with neck flexion.  Mentating well.  ANO x 3 GCS 15.  No focal deficits.  Cranials 2 through 12 intact.  Romberg negative.  No concerns for any  kind of infectious etiology.  I think patient's fever is likely related more to upper aspiratory infection given significant other also had an upper respiratory infection with similar symptoms over the past couple days.  She also has no leukocytosis that is concerning.  Examine the patient multiple times.  No pain with neck flexion.  Certainly think the risk of LP to rule out meningitis outweigh the benefits in this situation.  My concern for this very low.   Per Michaela : Recommended aggressive pain control.  Patient already received Compazine  Benadryl  and fluids as well as Decadron .  Headache somewhat improved but still continued to have pain.  Therefore, neurology recommended magnesium  as well as Depakote.    headache subsequently improved.  Did not completely resolve but did improve.  No visual symptoms.  Observe the patient in the ED.  Improvement in symptoms.  Spoke to neurology again.  Dr. Khaliqdina: Topimax 50 nightly. After a week can 50 mg BID. May make you more sleepy. If that the case can take night dose and not morning dose. . Can start outpatient.  On indication to admit the patient for inpatient idiopathic intracranial hypertension.  Remained hemodynamically stable.  ANO x 3 GCS 15.  No current concerns for sinus thrombosis.  No risk factors for this.  Patient will follow-up with neurology and ophthalmology outpatient.  Strict return precautions for any kind of headaches with fevers and altered mental status.  As well as any kind of vision changes.     Final diagnoses:  Nonintractable headache, unspecified chronicity pattern, unspecified headache type  Viral URI    ED Discharge Orders          Ordered    topiramate  (TOPAMAX ) 50 MG tablet  Multiple Frequencies        04/05/24 2242               Simon Lavonia SAILOR, MD 04/05/24 2252

## 2024-04-05 NOTE — ED Provider Triage Note (Signed)
 Emergency Medicine Provider Triage Evaluation Note  [name] [name] , a 38 y.o. female  was evaluated in triage.  Pt complains of Head aches, worse over the last 3 days and associated with dizziness with standing and walking around. No slurred speech, vision change or focal weakness. No injury or fall. Is being worked up for possible tumor.   Review of Systems  Positive: HA, fever Negative: Neck rigidity, focal weakness   Medical Decision Making  Medically screening exam initiated at 3:04 PM.  Appropriate orders placed.  Princetta Uplinger was informed that the remainder of the evaluation will be completed by another provider, this initial triage assessment does not replace that evaluation, and the importance of remaining in the ED until their evaluation is complete.     Shermon Warren SAILOR, PA-C 04/05/24 1506

## 2024-04-05 NOTE — Discharge Instructions (Addendum)
 We discharged on Topamax .  You will take 50 mg nightly for the first week.  You will then take Topamax  50 mg 2 times daily following this.   We do want you to follow up with ophthalmology.  I gave you the name and number to some ophthalmologist.  They can help diagnose idiopathic intracranial hypertension.  Important thing to watch out for is to be start any kind of vision changes.  If you start have any kind of vision changes then please come back to the ED.  This would be concerning for increased intracranial pressure that can threaten vision.  So if you start having kind of decreased vision with a headache then please come back.

## 2024-04-05 NOTE — ED Triage Notes (Addendum)
 Pt states she was dx with brain tumor, has taken her prescribed meds for headaches with no relief. States she is needing to make sure she isn't having herniation. States she had a fever of 102 oral this morning but it resolved with tylenol . Endorses nausea. Denies any other s/s at time of triage. Pt A&O x4 at time of triage.  Pt states extreme pressure on the sides of her head not like her normal headaches.

## 2024-05-15 ENCOUNTER — Telehealth: Payer: Self-pay | Admitting: Neurology

## 2024-05-15 ENCOUNTER — Ambulatory Visit: Admitting: Neurology

## 2024-05-15 NOTE — Telephone Encounter (Signed)
 Pt called to cancel appt  due to having surgery thursday   Appt Cancel

## 2024-07-21 ENCOUNTER — Telehealth: Admitting: Physician Assistant

## 2024-07-21 DIAGNOSIS — B9689 Other specified bacterial agents as the cause of diseases classified elsewhere: Secondary | ICD-10-CM

## 2024-07-21 DIAGNOSIS — R062 Wheezing: Secondary | ICD-10-CM | POA: Diagnosis not present

## 2024-07-21 DIAGNOSIS — J208 Acute bronchitis due to other specified organisms: Secondary | ICD-10-CM | POA: Diagnosis not present

## 2024-07-21 MED ORDER — AZITHROMYCIN 250 MG PO TABS: ORAL_TABLET | ORAL | 0 refills | Status: AC

## 2024-07-21 MED ORDER — PREDNISONE 10 MG (21) PO TBPK: ORAL_TABLET | ORAL | 0 refills | Status: AC

## 2024-07-21 MED ORDER — ALBUTEROL SULFATE HFA 108 (90 BASE) MCG/ACT IN AERS: 2.0000 | INHALATION_SPRAY | Freq: Four times a day (QID) | RESPIRATORY_TRACT | 0 refills | Status: AC | PRN

## 2024-07-21 MED ORDER — BENZONATATE 100 MG PO CAPS: 100.0000 mg | ORAL_CAPSULE | Freq: Three times a day (TID) | ORAL | 0 refills | Status: AC | PRN

## 2024-07-21 NOTE — Progress Notes (Signed)
 We are sorry that you are not feeling well.  Here is how we plan to help!  Based on your presentation I believe you most likely have A cough due to bacteria.  When patients have a fever and a productive cough with a change in color or increased sputum production, we are concerned about bacterial bronchitis.  If left untreated it can progress to pneumonia.  If your symptoms do not improve with your treatment plan it is important that you contact your provider.   I have prescribed Azithromyin 250 mg: two tablets now and then one tablet daily for 4 additonal days    In addition you may use A non-prescription cough medication called Mucinex  DM: take 2 tablets every 12 hours. and A prescription cough medication called Tessalon  Perles 100mg . You may take 1-2 capsules every 8 hours as needed for your cough.  Prednisone  10 mg daily for 6 days (see taper instructions below)  Directions for 6 day taper: Day 1: 2 tablets before breakfast, 1 after both lunch & dinner and 2 at bedtime Day 2: 1 tab before breakfast, 1 after both lunch & dinner and 2 at bedtime Day 3: 1 tab at each meal & 1 at bedtime Day 4: 1 tab at breakfast, 1 at lunch, 1 at bedtime Day 5: 1 tab at breakfast & 1 tab at bedtime Day 6: 1 tab at breakfast  From your responses in the eVisit questionnaire you describe inflammation in the upper respiratory tract which is causing a significant cough.  This is commonly called Bronchitis and has four common causes:   Allergies Viral Infections Acid Reflux Bacterial Infection Allergies, viruses and acid reflux are treated by controlling symptoms or eliminating the cause. An example might be a cough caused by taking certain blood pressure medications. You stop the cough by changing the medication. Another example might be a cough caused by acid reflux. Controlling the reflux helps control the cough.  USE OF BRONCHODILATOR (RESCUE) INHALERS: There is a risk from using your bronchodilator too  frequently.  The risk is that over-reliance on a medication which only relaxes the muscles surrounding the breathing tubes can reduce the effectiveness of medications prescribed to reduce swelling and congestion of the tubes themselves.  Although you feel brief relief from the bronchodilator inhaler, your asthma may actually be worsening with the tubes becoming more swollen and filled with mucus.  This can delay other crucial treatments, such as oral steroid medications. If you need to use a bronchodilator inhaler daily, several times per day, you should discuss this with your provider.  There are probably better treatments that could be used to keep your asthma under control.     HOME CARE Only take medications as instructed by your medical team. Complete the entire course of an antibiotic. Drink plenty of fluids and get plenty of rest. Avoid close contacts especially the very young and the elderly Cover your mouth if you cough or cough into your sleeve. Always remember to wash your hands A steam or ultrasonic humidifier can help congestion.   GET HELP RIGHT AWAY IF: You develop worsening fever. You become short of breath You cough up blood. Your symptoms persist after you have completed your treatment plan MAKE SURE YOU  Understand these instructions. Will watch your condition. Will get help right away if you are not doing well or get worse.  Your e-visit answers were reviewed by a board certified advanced clinical practitioner to complete your personal care plan.  Depending  on the condition, your plan could have included both over the counter or prescription medications. If there is a problem please reply  once you have received a response from your provider. Your safety is important to us .  If you have drug allergies check your prescription carefully.    You can use MyChart to ask questions about today's visit, request a non-urgent call back, or ask for a work or school excuse for 24  hours related to this e-Visit. If it has been greater than 24 hours you will need to follow up with your provider, or enter a new e-Visit to address those concerns. You will get an e-mail in the next two days asking about your experience.  I hope that your e-visit has been valuable and will speed your recovery. Thank you for using e-visits.   I have spent 5 minutes in review of e-visit questionnaire, review and updating patient chart, medical decision making and response to patient.   Delon CHRISTELLA Dickinson, PA-C
